# Patient Record
Sex: Male | Born: 1950 | Race: White | Hispanic: No | Marital: Married | Smoking: Never smoker
Health system: Southern US, Community
[De-identification: ages and names within clinical notes are randomized; demographics above are authoritative.]

## PROBLEM LIST (undated history)

## (undated) DIAGNOSIS — N400 Enlarged prostate without lower urinary tract symptoms: Secondary | ICD-10-CM

## (undated) DIAGNOSIS — J189 Pneumonia, unspecified organism: Secondary | ICD-10-CM

## (undated) DIAGNOSIS — E785 Hyperlipidemia, unspecified: Secondary | ICD-10-CM

## (undated) DIAGNOSIS — M1990 Unspecified osteoarthritis, unspecified site: Secondary | ICD-10-CM

## (undated) DIAGNOSIS — K219 Gastro-esophageal reflux disease without esophagitis: Secondary | ICD-10-CM

## (undated) DIAGNOSIS — I10 Essential (primary) hypertension: Secondary | ICD-10-CM

## (undated) DIAGNOSIS — H269 Unspecified cataract: Secondary | ICD-10-CM

## (undated) DIAGNOSIS — Z8489 Family history of other specified conditions: Secondary | ICD-10-CM

## (undated) DIAGNOSIS — H35039 Hypertensive retinopathy, unspecified eye: Secondary | ICD-10-CM

## (undated) DIAGNOSIS — A419 Sepsis, unspecified organism: Secondary | ICD-10-CM

## (undated) DIAGNOSIS — E7800 Pure hypercholesterolemia, unspecified: Secondary | ICD-10-CM

## (undated) HISTORY — DX: Hypertensive retinopathy, unspecified eye: H35.039

## (undated) HISTORY — PX: TONSILLECTOMY: SUR1361

## (undated) HISTORY — DX: Hyperlipidemia, unspecified: E78.5

## (undated) HISTORY — DX: Pure hypercholesterolemia, unspecified: E78.00

## (undated) HISTORY — DX: Essential (primary) hypertension: I10

## (undated) HISTORY — DX: Benign prostatic hyperplasia without lower urinary tract symptoms: N40.0

## (undated) HISTORY — DX: Gastro-esophageal reflux disease without esophagitis: K21.9

## (undated) HISTORY — PX: EYE SURGERY: SHX253

## (undated) HISTORY — DX: Unspecified osteoarthritis, unspecified site: M19.90

## (undated) HISTORY — DX: Unspecified cataract: H26.9

---

## 2017-08-22 HISTORY — PX: OTHER SURGICAL HISTORY: SHX169

## 2019-08-06 ENCOUNTER — Other Ambulatory Visit: Payer: Self-pay

## 2019-08-07 ENCOUNTER — Ambulatory Visit (INDEPENDENT_AMBULATORY_CARE_PROVIDER_SITE_OTHER): Payer: Medicare Other | Admitting: Family Medicine

## 2019-08-07 ENCOUNTER — Encounter: Payer: Self-pay | Admitting: Family Medicine

## 2019-08-07 VITALS — BP 196/110 | HR 88 | Temp 99.4°F | Ht 72.0 in | Wt 195.0 lb

## 2019-08-07 DIAGNOSIS — N402 Nodular prostate without lower urinary tract symptoms: Secondary | ICD-10-CM | POA: Insufficient documentation

## 2019-08-07 DIAGNOSIS — L570 Actinic keratosis: Secondary | ICD-10-CM | POA: Diagnosis not present

## 2019-08-07 DIAGNOSIS — I10 Essential (primary) hypertension: Secondary | ICD-10-CM | POA: Diagnosis not present

## 2019-08-07 DIAGNOSIS — R350 Frequency of micturition: Secondary | ICD-10-CM

## 2019-08-07 DIAGNOSIS — Z7689 Persons encountering health services in other specified circumstances: Secondary | ICD-10-CM

## 2019-08-07 DIAGNOSIS — N401 Enlarged prostate with lower urinary tract symptoms: Secondary | ICD-10-CM | POA: Insufficient documentation

## 2019-08-07 DIAGNOSIS — Z8669 Personal history of other diseases of the nervous system and sense organs: Secondary | ICD-10-CM

## 2019-08-07 MED ORDER — METOPROLOL TARTRATE 50 MG PO TABS: 50.0000 mg | ORAL_TABLET | Freq: Two times a day (BID) | ORAL | 3 refills | Status: DC

## 2019-08-07 NOTE — Patient Instructions (Signed)
Start checking your blood pressure at home.    How to Take Your Blood Pressure You can take your blood pressure at home with a machine. You may need to check your blood pressure at home:  To check if you have high blood pressure (hypertension).  To check your blood pressure over time.  To make sure your blood pressure medicine is working. Supplies needed: You will need a blood pressure machine, or monitor. You can buy one at a drugstore or online. When choosing one:  Choose one with an arm cuff.  Choose one that wraps around your upper arm. Only one finger should fit between your arm and the cuff.  Do not choose one that measures your blood pressure from your wrist or finger. Your doctor can suggest a monitor. How to prepare Avoid these things for 30 minutes before checking your blood pressure:  Drinking caffeine.  Drinking alcohol.  Eating.  Smoking.  Exercising. Five minutes before checking your blood pressure:  Pee.  Sit in a dining chair. Avoid sitting in a soft couch or armchair.  Be quiet. Do not talk. How to take your blood pressure Follow the instructions that came with your machine. If you have a digital blood pressure monitor, these may be the instructions: 1. Sit up straight. 2. Place your feet on the floor. Do not cross your ankles or legs. 3. Rest your left arm at the level of your heart. You may rest it on a table, desk, or chair. 4. Pull up your shirt sleeve. 5. Wrap the blood pressure cuff around the upper part of your left arm. The cuff should be 1 inch (2.5 cm) above your elbow. It is best to wrap the cuff around bare skin. 6. Fit the cuff snugly around your arm. You should be able to place only one finger between the cuff and your arm. 7. Put the cord inside the groove of your elbow. 8. Press the power button. 9. Sit quietly while the cuff fills with air and loses air. 10. Write down the numbers on the screen. 11. Wait 2-3 minutes and then repeat  steps 1-10. What do the numbers mean? Two numbers make up your blood pressure. The first number is called systolic pressure. The second is called diastolic pressure. An example of a blood pressure reading is "120 over 80" (or 120/80). If you are an adult and do not have a medical condition, use this guide to find out if your blood pressure is normal: Normal  First number: below 120.  Second number: below 80. Elevated  First number: 120-129.  Second number: below 80. Hypertension stage 1  First number: 130-139.  Second number: 80-89. Hypertension stage 2  First number: 140 or above.  Second number: 11 or above. Your blood pressure is above normal even if only the top or bottom number is above normal. Follow these instructions at home:  Check your blood pressure as often as your doctor tells you to.  Take your monitor to your next doctor's appointment. Your doctor will: ? Make sure you are using it correctly. ? Make sure it is working right.  Make sure you understand what your blood pressure numbers should be.  Tell your doctor if your medicines are causing side effects. Contact a doctor if:  Your blood pressure keeps being high. Get help right away if:  Your first blood pressure number is higher than 180.  Your second blood pressure number is higher than 120. This information is not  intended to replace advice given to you by your health care provider. Make sure you discuss any questions you have with your health care provider. Document Released: 07/21/2008 Document Revised: 07/21/2017 Document Reviewed: 01/15/2016 Elsevier Patient Education  2020 [employer].

## 2019-08-07 NOTE — Progress Notes (Signed)
 Subjective: CC:establish care, BPH with LUTS and nodular prostate; history of left retinal tear, hypertension and hyperlipidemia HPI: Ralph Stevens is a 68 y.o. male presenting to clinic today for:  1.  Hypertension with hyperlipidemia Patient reports compliance with metoprolol 50 mg twice daily, lisinopril 40 mg daily, Lipitor 10 mg daily.  He notes that blood pressures typically range 120s over 80s at home.  He is unsure as to why his blood pressure is elevated today.  He denies any chest pain, shortness of breath, dizziness, headache or lower extremity edema.  2.  BPH with urinary frequency and nodular prostate Patient was being followed by a urologist in [place] who is keeping a close eye on his prostate.  He had previous MRI of the prostate which did not show evidence of malignancy.  He is treated with Flomax for urinary frequency and reports good results with this.  He had labs done earlier this year which were stable.  He would like to establish with somebody locally.  3.  History of retinal tear Patient reports history of retinal tear on the left that is status post surgical repair.  Vision is stable but he would like to go ahead and establish with an ophthalmologist to follow-up on this.  4.  Facial lesions Patient reports a couple of crusty lesions along the right and left cheeks that he would like to have looked at today.  He would like to ultimately establish with a dermatologist given exposure to sunlight.  He has had previous cryotherapy but she would like to have repeated today if possible.  Does not report any pigmented nevi of concern.  Past Medical History:  Diagnosis Date  . Arthritis   . GERD (gastroesophageal reflux disease)   . Hyperlipidemia   . Hypertension    History reviewed. No pertinent surgical history. Social History   Socioeconomic History  . Marital status: Married    Spouse name: Not on file  . Number of children: 3  . Years of education:  Not on file  . Highest education level: Not on file  Occupational History  . Not on file  Tobacco Use  . Smoking status: Never Smoker  . Smokeless tobacco: Never Used  Substance and Sexual Activity  . Alcohol use: Never  . Drug use: Never  . Sexual activity: Not on file  Other Topics Concern  . 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  He currently is unemployed but is actively looking for work as he enjoys being busy.   He recently relocated from [place] and resides locally with his wife.  He has a son that lives in [place] but his other 2 daughters are residing in [place].   Social Determinants of Health   Financial Resource Strain:   . Difficulty of Paying Living Expenses: Not on file  Food Insecurity:   . Worried About Running Out of Food in the Last Year: Not on file  . Ran Out of Food in the Last Year: Not on file  Transportation Needs:   . Lack of Transportation (Medical): Not on file  . Lack of Transportation (Non-Medical): Not on file  Physical Activity:   . Days of Exercise per Week: Not on file  . Minutes of Exercise per Session: Not on file  Stress:   . Feeling of Stress : Not on file  Social Connections:   .   Frequency of Communication with Friends and Family: Not on file  . Frequency of Social Gatherings with Friends and Family: Not on file  . Attends Religious Services: Not on file  . Active Member of Clubs or Organizations: Not on file  . Attends Club or Organization Meetings: Not on file  . Marital Status: Not on file  Intimate Partner Violence:   . Fear of Current or Ex-Partner: Not on file  . Emotionally Abused: Not on file  . Physically Abused: Not on file  . Sexually Abused: Not on file   Current Meds  Medication Sig  . aspirin 81 MG chewable tablet Chew by mouth daily.  . esomeprazole (NEXIUM) 20 MG capsule Take 20 mg by mouth daily at 12 noon.  . fluticasone  (FLONASE) 50 MCG/ACT nasal spray Place into both nostrils daily.  . metoprolol tartrate (LOPRESSOR) 50 MG tablet Take 1 tablet (50 mg total) by mouth 2 (two) times daily.  . tamsulosin (FLOMAX) 0.4 MG CAPS capsule Take 0.4 mg by mouth daily after supper.  . [DISCONTINUED] metoprolol tartrate (LOPRESSOR) 50 MG tablet Take 50 mg by mouth 2 (two) times daily.   Family History  Problem Relation Age of Onset  . Cancer Mother   . Colon cancer Mother   . COPD Father   . Hypothyroidism Sister   . Thyroid disease Sister   . Hypertension Sister   . Multiple sclerosis Daughter   . Thyroid disease Sister    No Known Allergies   Health Maintenance: Up-to-date ROS: Per HPI  Objective: Office vital signs reviewed. BP (!) 196/110   Pulse 88   Temp 99.4 F (37.4 C) (Temporal)   Ht 6' (1.829 m)   Wt 195 lb (88.5 kg)   SpO2 97%   BMI 26.45 kg/m   Physical Examination:  General: Awake, alert, well nourished, well appearing. No acute distress HEENT: Sclera white, moist mucous membranes Cardio: regular rate and rhythm, S1S2 heard, no murmurs appreciated Pulm: clear to auscultation bilaterally, no wheezes, rhonchi or rales; normal work of breathing on room air Extremities: warm, well perfused, No edema, cyanosis or clubbing; +2 pulses bilaterally MSK: normal gait and station Skin: dry; intact; he has 1 lesion along the right middle cheek that is consistent with a small actinic keratosis.  No appreciable pigmentation.  He has a similar lesion along the left jaw about the same size.  Again no pigmentation or other concerning features. Neuro: No focal neurologic deficits.  Alert and oriented x3 Psych: Mood stable, speech normal, affect appropriate, pleasant and interactive  Cryotherapy Procedure:  Risks and benefits of procedure were reviewed with the patient.  Written consent obtained and scanned into the chart.  Lesion of concern was identified and located on right cheek.  Liquid nitrogen was  applied to area of concern and extending out 1 millimeters beyond the border of the lesion.  Treated area was allowed to come back to room temperature before treating it a second time.  Patient tolerated procedure well and there were no immediate complications.  Home care instructions were reviewed with the patient and a handout was provided.  Cryotherapy Procedure:  Risks and benefits of procedure were reviewed with the patient.  Written consent obtained and scanned into the chart.  Lesion of concern was identified and located on left cheek/jaw.  Liquid nitrogen was applied to area of concern and extending out 1 millimeters beyond the border of the lesion.  Treated area was allowed to come back to room temperature   before treating it a second time.  Patient tolerated procedure well and there were no immediate complications.  Home care instructions were reviewed with the patient and a handout was provided.   Assessment/ Plan: 68 y.o. male   1. Accelerated hypertension Uncertain etiology.  He reports normal blood pressures at home.  Possibly anxiety mediated given new appointment.  I have asked him to monitor his blood pressures very closely and contact me if blood pressures are not going back to goal of less than 140/90.  May need to consider adding a calcium channel blocker.  I believe that hydrochlorothiazide was discontinued previously secondary to increased urinary frequency which he already struggles with due to BPH.  He understands red flag signs and symptoms warranting further evaluation emergency department.  He will follow-up in 1 week for blood pressure check with RN - [id number]  2. Establishing care with new doctor, encounter for  3. Benign prostatic hyperplasia with urinary frequency Asymptomatic.  Referral placed to urology.  Will fax notes from [place] - Ambulatory referral to Urology  4. Nodular prostate - Ambulatory referral to Urology  5. Actinic keratoses Treated with  cryotherapy during today's visit.  No immediate complications.  Patient tolerated procedure well.  Home care instructions were reviewed.  He will follow-up as needed - Ambulatory referral to Dermatology  6. History of retinal tear Referred to ophthalmology - Ambulatory referral to Ophthalmology   Ralph Stevens, [place] [phone]

## 2019-08-08 LAB — CMP14+EGFR
ALT: 12 IU/L (ref 0–44)
AST: 14 IU/L (ref 0–40)
Albumin/Globulin Ratio: 1.6 (ref 1.2–2.2)
Albumin: 4.4 g/dL (ref 3.8–4.8)
Alkaline Phosphatase: 73 IU/L (ref 39–117)
BUN/Creatinine Ratio: 11 (ref 10–24)
BUN: 10 mg/dL (ref 8–27)
Bilirubin Total: 0.6 mg/dL (ref 0.0–1.2)
CO2: 21 mmol/L (ref 20–29)
Calcium: 8.9 mg/dL (ref 8.6–10.2)
Chloride: 106 mmol/L (ref 96–106)
Creatinine, Ser: 0.88 mg/dL (ref 0.76–1.27)
GFR calc Af Amer: 103 mL/min/{1.73_m2} (ref >59)
GFR calc non Af Amer: 89 mL/min/{1.73_m2} (ref >59)
Globulin, Total: 2.7 g/dL (ref 1.5–4.5)
Glucose: 105 mg/dL — ABNORMAL HIGH (ref 65–99)
Potassium: 3.7 mmol/L (ref 3.5–5.2)
Sodium: 143 mmol/L (ref 134–144)
Total Protein: 7.1 g/dL (ref 6.0–8.5)

## 2019-08-12 ENCOUNTER — Other Ambulatory Visit: Payer: Self-pay

## 2019-08-12 ENCOUNTER — Ambulatory Visit: Payer: Medicare Other

## 2019-08-12 VITALS — BP 199/97 | HR 84

## 2019-08-12 DIAGNOSIS — I10 Essential (primary) hypertension: Secondary | ICD-10-CM

## 2019-08-12 NOTE — Progress Notes (Signed)
Patient here today for blood pressure check.  First blood pressure reading today was 208/100, pulse 87.  Patient sat for a few minutes and blood pressure was retaken, reading was 199/97, pulse 84.  Patient was seen by Dr. Lajuana Ripple on 08/07/19 and blood pressure was elevated at that time.  Notes suggest possible addition of calcium channel blocker.

## 2019-08-13 ENCOUNTER — Other Ambulatory Visit: Payer: Self-pay | Admitting: Physician Assistant

## 2019-08-13 ENCOUNTER — Telehealth: Payer: Self-pay | Admitting: Family Medicine

## 2019-08-13 MED ORDER — AMLODIPINE BESYLATE 5 MG PO TABS: 5.0000 mg | ORAL_TABLET | Freq: Every day | ORAL | 0 refills | Status: DC

## 2019-08-13 NOTE — Telephone Encounter (Signed)
Spoke to pt and advised we sent over Amlodipine 5mg  and that he should continue the metoprolol as he has been taking it and just add the amlodipine to that as well as monitor his BP readings. Pt voiced understanding.

## 2019-08-13 NOTE — Progress Notes (Signed)
Patient is aware and will pick up med this morning.  He will call with report of blood pressure.  Barnett Applebaum is aware.

## 2019-08-13 NOTE — Progress Notes (Signed)
Patient's readings at home are ranging from 172/87, 186/98, 162/96, 166/86, 160/93.  I made an appointment for a follow up with Dr. Lajuana Ripple on 09/02/19.  Please advise on any medication changes.

## 2019-08-13 NOTE — Telephone Encounter (Signed)
Patient called stating that a prescription was just sent over to his pharmacy for his BP but says he has already been taking Metoprolol 50 mg 2x per day. Wants to know what is going on.. Requested call back from nurse.

## 2019-08-19 ENCOUNTER — Telehealth: Payer: Self-pay | Admitting: Family Medicine

## 2019-08-19 NOTE — Telephone Encounter (Signed)
Spoke with pt and advised of provider feedback and pt voiced understanding.

## 2019-08-19 NOTE — Telephone Encounter (Signed)
Recent BPs in controlled range.  Continue current regimen, including newly added amlodipine.  Keep f/u BP appt.

## 2019-08-30 ENCOUNTER — Other Ambulatory Visit: Payer: Self-pay

## 2019-09-02 ENCOUNTER — Ambulatory Visit (INDEPENDENT_AMBULATORY_CARE_PROVIDER_SITE_OTHER): Payer: Medicare Other | Admitting: Family Medicine

## 2019-09-02 ENCOUNTER — Encounter: Payer: Self-pay | Admitting: Family Medicine

## 2019-09-02 ENCOUNTER — Other Ambulatory Visit: Payer: Self-pay

## 2019-09-02 VITALS — BP 127/81 | HR 73 | Temp 98.6°F | Ht 72.0 in | Wt 196.0 lb

## 2019-09-02 DIAGNOSIS — I10 Essential (primary) hypertension: Secondary | ICD-10-CM | POA: Diagnosis not present

## 2019-09-02 MED ORDER — HYDROCHLOROTHIAZIDE 12.5 MG PO CAPS: 12.5000 mg | ORAL_CAPSULE | Freq: Every day | ORAL | 3 refills | Status: DC

## 2019-09-02 MED ORDER — AMLODIPINE BESYLATE 5 MG PO TABS: 5.0000 mg | ORAL_TABLET | Freq: Every day | ORAL | 3 refills | Status: DC

## 2019-09-02 NOTE — Progress Notes (Signed)
Subjective: CC: f/u HTN PCP: [name], DO [id number] Ralph Stevens is a 69 y.o. male presenting to clinic today for:  1. HTN Patient noted to have uncontrolled blood pressures despite compliance with at home blood pressure medications.  At his 1 week follow-up with the nurse his blood pressure continue to be well into the [id number] systolics.  Norvasc 5 mg was added to his regimen of Zestril 40 mg QD and Lopressor 50 mg twice daily.  His blood pressures at home had subsequently come back into normal range.  He is here for interval checkup.  He does report that he resumed his hydrochlorothiazide 12.5 mg after her last visit but this did not seem to impact his blood pressures much.  He brings a blood pressure log today with the highest systolic in the 0000000 but average systolics in the 0000000 to [id number].  Diastolics are controlled.  He denies any lower extremity edema with the Norvasc.  No chest pain, shortness of breath or dizziness.  Overall he feels well.   ROS: Per HPI  No Known Allergies Past Medical History:  Diagnosis Date  . Arthritis   . GERD (gastroesophageal reflux disease)   . Hyperlipidemia   . Hypertension     Current Outpatient Medications:  .  amLODipine (NORVASC) 5 MG tablet, Take 1 tablet (5 mg total) by mouth daily., Disp: 30 tablet, Rfl: 0 .  aspirin 81 MG chewable tablet, Chew by mouth daily., Disp: , Rfl:  .  atorvastatin (LIPITOR) 10 MG tablet, Take 10 mg by mouth daily., Disp: , Rfl:  .  esomeprazole (NEXIUM) 20 MG capsule, Take 20 mg by mouth daily at 12 noon., Disp: , Rfl:  .  fluticasone (FLONASE) 50 MCG/ACT nasal spray, Place into both nostrils daily., Disp: , Rfl:  .  lisinopril (ZESTRIL) 40 MG tablet, Take 40 mg by mouth daily., Disp: , Rfl:  .  metoprolol tartrate (LOPRESSOR) 50 MG tablet, Take 1 tablet (50 mg total) by mouth 2 (two) times daily., Disp: 180 tablet, Rfl: 3 .  tamsulosin (FLOMAX) 0.4 MG CAPS capsule, Take 0.4 mg by mouth daily after supper., Disp: ,  Rfl:  Social History   Socioeconomic History  . Marital status: Married    Spouse name: Not on file  . Number of children: 3  . Years of education: Not on file  . Highest education level: Not on file  Occupational History  . Not on file  Tobacco Use  . Smoking status: Never Smoker  . Smokeless tobacco: Never Used  Substance and Sexual Activity  . Alcohol use: Never  . Drug use: Never  . Sexual activity: Not on file  Other Topics Concern  . 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  He currently is unemployed but is actively looking for work as he enjoys being busy.   He recently relocated from [place] and resides locally with his wife.  He has a son that lives in [place] but his other 2 daughters are residing in [place].   Social Determinants of Health   Financial Resource Strain:   . Difficulty of Paying Living Expenses: Not on file  Food Insecurity:   . Worried About Charity fundraiser in the Last Year: Not on file  . Ran Out of Food in the Last Year: Not on file  Transportation Needs:   . Lack of Transportation (Medical):  Not on file  . Lack of Transportation (Non-Medical): Not on file  Physical Activity:   . Days of Exercise per Week: Not on file  . Minutes of Exercise per Session: Not on file  Stress:   . Feeling of Stress : Not on file  Social Connections:   . Frequency of Communication with Friends and Family: Not on file  . Frequency of Social Gatherings with Friends and Family: Not on file  . Attends Religious Services: Not on file  . Active Member of Clubs or Organizations: Not on file  . Attends Archivist Meetings: Not on file  . Marital Status: Not on file  Intimate Partner Violence:   . Fear of Current or Ex-Partner: Not on file  . Emotionally Abused: Not on file  . Physically Abused: Not on file  . Sexually Abused: Not on file   Family History  Problem  Relation Age of Onset  . Cancer Mother   . Colon cancer Mother   . COPD Father   . Hypothyroidism Sister   . Thyroid disease Sister   . Hypertension Sister   . Multiple sclerosis Daughter   . Thyroid disease Sister     Objective: Office vital signs reviewed. BP 127/81   Pulse 73   Temp 98.6 F (37 C) (Temporal)   Ht 6' (1.829 m)   Wt 196 lb (88.9 kg)   SpO2 99%   BMI 26.58 kg/m   Physical Examination:  General: Awake, alert, well nourished, No acute distress HEENT: Normal, MMM, sclera white Cardio: regular rate and rhythm, S1S2 heard, no murmurs appreciated Pulm: clear to auscultation bilaterally, no wheezes, rhonchi or rales; normal work of breathing on room air Extremities: warm, well perfused, No edema, cyanosis or clubbing; +1 pulses bilaterally  Assessment/ Plan: 69 y.o. male   1. Essential hypertension Now controlled.  Continue quadruple therapy antihypertensives.  We discussed could consider trial off of the diuretic if becomes bothersome since it did not have a significant impact in his blood pressure.  Though it may be mitigating issues with pedal edema with the calcium channel blocker.  He will follow-up in 6 months, sooner if needed. - hydrochlorothiazide (MICROZIDE) 12.5 MG capsule; Take 1 capsule (12.5 mg total) by mouth daily.  Dispense: 90 capsule; Refill: 3 - amLODipine (NORVASC) 5 MG tablet; Take 1 tablet (5 mg total) by mouth daily.  Dispense: 90 tablet; Refill: 3   No orders of the defined types were placed in this encounter.  No orders of the defined types were placed in this encounter.    [name], DO [place] [phone]

## 2019-09-03 DIAGNOSIS — B9689 Other specified bacterial agents as the cause of diseases classified elsewhere: Secondary | ICD-10-CM | POA: Diagnosis not present

## 2019-09-03 DIAGNOSIS — Z1283 Encounter for screening for malignant neoplasm of skin: Secondary | ICD-10-CM | POA: Diagnosis not present

## 2019-09-03 DIAGNOSIS — D225 Melanocytic nevi of trunk: Secondary | ICD-10-CM | POA: Diagnosis not present

## 2019-09-03 DIAGNOSIS — L570 Actinic keratosis: Secondary | ICD-10-CM | POA: Diagnosis not present

## 2019-09-03 DIAGNOSIS — L02821 Furuncle of head [any part, except face]: Secondary | ICD-10-CM | POA: Diagnosis not present

## 2019-09-17 ENCOUNTER — Ambulatory Visit: Payer: Medicare Other

## 2019-09-17 NOTE — Progress Notes (Signed)
[facility] Note  09/30/2019     CHIEF COMPLAINT Patient presents for Retina Evaluation   HISTORY OF PRESENT ILLNESS: Ralph Stevens is a 69 y.o. male who presents to the clinic today for:   HPI    Retina Evaluation    In both eyes.  This started 1 month ago.  Associated Symptoms Floaters.  Negative for Flashes, Pain, Trauma, Fever, Weight Loss, Scalp Tenderness, Redness, Distortion, Photophobia, Jaw Claudication, Fatigue, Shoulder/Hip pain, Glare and Blind Spot.  Context:  distance vision, mid-range vision and near vision.  Treatments tried include surgery.  Response to treatment was no improvement.  I, the attending physician,  performed the HPI with the patient and updated documentation appropriately.          Comments    Referral of Dr. Lajuana Ripple for retina eval. Patient states he had retina repair os 3 yrs ago by Dr. Shana Chute in [place]. [place]. Pt denies flashes and ocular pain. Pt has hx of floaters.       Last edited by Bernarda Caffey, MD on 09/30/2019  1:41 PM. (History)    pt moved here from [place]. [place], [place] in September, pt had a retinal tear in his left eye that he had lasered by a dr in [place]. [place] [facility]), pt states he followed with that dr until early last year (Dr. Trudie Reed), pt states he feels like his vision is better since his last eye exam  Referring physician: [name], DO [place],  [place] [address]  HISTORICAL INFORMATION:   Selected notes from the MEDICAL RECORD NUMBER Referred by PCP (Dr. Lajuana Ripple) for history of retinal tear OS   CURRENT MEDICATIONS: No current outpatient medications on file. (Ophthalmic Drugs)   No current facility-administered medications for this visit. (Ophthalmic Drugs)   Current Outpatient Medications (Other)  Medication Sig  . alfuzosin (UROXATRAL) 10 MG 24 hr tablet Take 10 mg by mouth daily with breakfast.  . alfuzosin (UROXATRAL) 10 MG 24 hr tablet Take 1 tablet  (10 mg total) by mouth daily with breakfast.  . amLODipine (NORVASC) 5 MG tablet Take 1 tablet (5 mg total) by mouth daily.  Marland Kitchen aspirin 81 MG chewable tablet Chew by mouth daily.  Marland Kitchen atorvastatin (LIPITOR) 10 MG tablet Take 10 mg by mouth daily.  Marland Kitchen esomeprazole (NEXIUM) 20 MG capsule Take 20 mg by mouth daily at 12 noon.  . fluticasone (FLONASE) 50 MCG/ACT nasal spray Place into both nostrils daily.  Marland Kitchen lisinopril (ZESTRIL) 40 MG tablet Take 40 mg by mouth daily.  . metoprolol tartrate (LOPRESSOR) 50 MG tablet Take 1 tablet (50 mg total) by mouth 2 (two) times daily.  . hydrochlorothiazide (MICROZIDE) 12.5 MG capsule Take 12.5 mg by mouth daily.  . tamsulosin (FLOMAX) 0.4 MG CAPS capsule Take 0.4 mg by mouth daily after supper.   No current facility-administered medications for this visit. (Other)      REVIEW OF SYSTEMS: ROS    Positive for: Cardiovascular, Eyes   Negative for: Constitutional, Gastrointestinal, Neurological, Skin, Genitourinary, Musculoskeletal, HENT, Endocrine, Respiratory, Psychiatric, Allergic/Imm, Heme/Lymph   Last edited by Zenovia Jordan, LPN on [id number]  579FGE PM. (History)       ALLERGIES No Known Allergies  PAST MEDICAL HISTORY Past Medical History:  Diagnosis Date  . Acid reflux   . Arthritis   . Arthritis   . Enlarged prostate    PSA goes up and down   . GERD (gastroesophageal  reflux disease)   . High cholesterol   . Hyperlipidemia   . Hypertension    Past Surgical History:  Procedure Laterality Date  . EYE SURGERY    . TONSILLECTOMY     age 20     FAMILY HISTORY Family History  Problem Relation Age of Onset  . Cancer Mother   . Colon cancer Mother   . Dementia Mother   . Alzheimer's disease Mother   . Emphysema Father   . Hypothyroidism Sister   . Thyroid disease Sister   . Hypertension Sister   . Multiple sclerosis Daughter   . Heart defect Daughter   . Cancer Maternal Grandmother        colon  . Heart disease Maternal  Grandfather   . Heart disease Paternal Grandmother   . Cancer Paternal Grandfather        unknown ( mouth/throat)    SOCIAL HISTORY Social History   Tobacco Use  . Smoking status: Never Smoker  . Smokeless tobacco: Never Used  Substance Use Topics  . Alcohol use: Never  . Drug use: Never         OPHTHALMIC EXAM:  Base Eye Exam    Visual Acuity (Snellen - Linear)      Right Left   Dist cc 20/30 20/20   Dist ph cc 20/25 +1    Correction: Glasses       Tonometry (Tonopen, 1:21 PM)      Right Left   Pressure 12 14       Pupils      Dark Light Shape React APD   Right 3 2 Round Brisk None   Left 3 2 Round Brisk None       Visual Fields (Counting fingers)      Left Right    Full Full       Extraocular Movement      Right Left    Full, Ortho Full, Ortho       Neuro/Psych    Oriented x3: Yes       Dilation    Both eyes: 1.0% Mydriacyl, 2.5% Phenylephrine @ 1:21 PM        Slit Lamp and Fundus Exam    Slit Lamp Exam      Right Left   Lids/Lashes Dermatochalasis - upper lid, mild Meibomian gland dysfunction Dermatochalasis - upper lid, mild Meibomian gland dysfunction   Conjunctiva/Sclera White and quiet White and quiet   Cornea Trace Punctate epithelial erosions, mild Arcus Trace Punctate epithelial erosions, mild Arcus   Anterior Chamber Deep,clear, narrow temporal angle Deep and quiet   Iris Round and moderately dilated to 5.58mm Round and moderately dilated to 5.19mm   Lens 2-3+ Nuclear sclerosis, 2-3+ Cortical cataract 2-3+ Nuclear sclerosis, 2-3+ Cortical cataract   Vitreous Vitreous syneresis Vitreous syneresis       Fundus Exam      Right Left   Disc Pink and Sharp Pink and Sharp   C/D Ratio 0.3 0.2   Macula Flat, Good foveal reflex, Retinal pigment epithelial mottling, No heme or edema Flat, blunted foveal reflex, ERM with striae, No heme    Vessels Mild Vascular attenuation, mild Tortuousity Mild Vascular attenuation, mild Tortuousity    Periphery Attached, No heme  Attached, operculated tear at 1200 with good laser surrounding, No heme           IMAGING AND PROCEDURES  Imaging and Procedures for @TODAY @  OCT, Retina - OU - Both Eyes  Right Eye Quality was good. Central Foveal Thickness: 295. Progression has no prior data. Findings include normal foveal contour, no IRF, no SRF.   Left Eye Quality was good. Central Foveal Thickness: 403. Progression has no prior data. Findings include abnormal foveal contour, epiretinal membrane, no IRF, no SRF, macular pucker.   Notes *Images captured and stored on drive  Diagnosis / Impression:  OD: NFP, no IRF/SRF OS: +ERM w/ pucker; abnormal foveal profile; no IRF/SRF  Clinical management:  See below  Abbreviations: NFP - Normal foveal profile. CME - cystoid macular edema. PED - pigment epithelial detachment. IRF - intraretinal fluid. SRF - subretinal fluid. EZ - ellipsoid zone. ERM - epiretinal membrane. ORA - outer retinal atrophy. ORT - outer retinal tubulation. SRHM - subretinal hyper-reflective material                 ASSESSMENT/PLAN:    ICD-10-CM   1. Epiretinal membrane (ERM) of left eye  H35.372   2. Retinal edema  H35.81 OCT, Retina - OU - Both Eyes  3. History of repair of retinal tear by laser photocoagulation  Z98.890   4. Essential hypertension  I10   5. Hypertensive retinopathy of both eyes  H35.033   6. Combined forms of age-related cataract of both eyes  H25.813     1,2. Epiretinal membrane, OS  - The natural history, anatomy, potential for loss of vision, and treatment options including vitrectomy techniques and the complications of endophthalmitis, retinal detachment, vitreous hemorrhage, cataract progression and permanent vision loss discussed with the patient.  - likely related to history of RT s/p laser retinopexy (see below)  - mild ERM w/ early pucker  - asymptomatic, no metamorphopsia  - no indication for surgery at this  time  - monitor for now  - f/u 3-4 mos, DFE, OCT  3. History of retinal tear OS  - operculated tear located at 1200  - s/p laser retinopexy (Dr. Shana Chute, [facility], [place], [place]) -- good laser surrounding  - stable  - no other RT/RD  4,5. Hypertensive retinopathy OU  - discussed importance of tight BP control  - monitor  6. Mixed form age related cataracts OU  - The symptoms of cataract, surgical options, and treatments and risks were discussed with patient.  - discussed diagnosis and progression  - not yet visually significant  - monitor for now    Ophthalmic Meds Ordered this visit:  No orders of the defined types were placed in this encounter.      Return for 3-4 mos - f/u ERM OS.  There are no Patient Instructions on file for this visit.   Explained the diagnoses, plan, and follow up with the patient and they expressed understanding.  Patient expressed understanding of the importance of proper follow up care.   This document serves as a record of services personally performed by Gardiner Sleeper, MD, PhD. It was created on their behalf by Leeann Must, [place], a certified ophthalmic assistant. The creation of this record is the provider's dictation and/or activities during the visit.    Electronically signed by: Leeann Must, COA @TODAY @ 11:42 PM   This document serves as a record of services personally performed by Gardiner Sleeper, MD, PhD. It was created on their behalf by Ernest Mallick, OA, an ophthalmic assistant. The creation of this record is the provider's dictation and/or activities during the visit.    Electronically signed by: Ernest Mallick, OA 02.08.2021 11:42 PM    Aaron Edelman  Antony Haste, M.D., Ph.D. Diseases & Surgery of the Retina and Vitreous [facility]   I have reviewed the above documentation for accuracy and completeness, and I agree with the above. Gardiner Sleeper, M.D., Ph.D. 09/30/19 11:42  PM     Abbreviations: M myopia (nearsighted); A astigmatism; H hyperopia (farsighted); P presbyopia; Mrx spectacle prescription;  CTL contact lenses; OD right eye; OS left eye; OU both eyes  XT exotropia; ET esotropia; PEK punctate epithelial keratitis; PEE punctate epithelial erosions; DES dry eye syndrome; MGD meibomian gland dysfunction; ATs artificial tears; PFAT's preservative free artificial tears; [place] nuclear sclerotic cataract; PSC posterior subcapsular cataract; ERM epi-retinal membrane; PVD posterior vitreous detachment; RD retinal detachment; DM diabetes mellitus; DR diabetic retinopathy; NPDR non-proliferative diabetic retinopathy; PDR proliferative diabetic retinopathy; CSME clinically significant macular edema; DME diabetic macular edema; dbh dot blot hemorrhages; CWS cotton wool spot; POAG primary open angle glaucoma; C/D cup-to-disc ratio; HVF humphrey visual field; GVF goldmann visual field; OCT optical coherence tomography; IOP intraocular pressure; BRVO Branch retinal vein occlusion; CRVO central retinal vein occlusion; CRAO central retinal artery occlusion; BRAO branch retinal artery occlusion; RT retinal tear; SB scleral buckle; PPV pars plana vitrectomy; VH Vitreous hemorrhage; PRP panretinal laser photocoagulation; IVK intravitreal kenalog; VMT vitreomacular traction; MH Macular hole;  NVD neovascularization of the disc; NVE neovascularization elsewhere; AREDS age related eye disease study; ARMD age related macular degeneration; POAG primary open angle glaucoma; EBMD epithelial/anterior basement membrane dystrophy; ACIOL anterior chamber intraocular lens; IOL intraocular lens; PCIOL posterior chamber intraocular lens; Phaco/IOL phacoemulsification with intraocular lens placement; [place] photorefractive keratectomy; LASIK laser assisted in situ keratomileusis; HTN hypertension; DM diabetes mellitus; COPD chronic obstructive pulmonary disease

## 2019-09-20 ENCOUNTER — Ambulatory Visit (INDEPENDENT_AMBULATORY_CARE_PROVIDER_SITE_OTHER): Payer: Medicare Other | Admitting: *Deleted

## 2019-09-20 VITALS — BP 125/75 | Ht 72.0 in | Wt 196.0 lb

## 2019-09-20 DIAGNOSIS — Z0000 Encounter for general adult medical examination without abnormal findings: Secondary | ICD-10-CM | POA: Diagnosis not present

## 2019-09-20 NOTE — Progress Notes (Signed)
MEDICARE ANNUAL WELLNESS VISIT  09/20/2019  Telephone Visit Disclaimer This Medicare AWV was conducted by telephone due to national recommendations for restrictions regarding the COVID-19 Pandemic (e.g. social distancing).  I verified, using two identifiers, that I am speaking with Ralph Stevens or their authorized healthcare agent. I discussed the limitations, risks, security, and privacy concerns of performing an evaluation and management service by telephone and the potential availability of an in-person appointment in the future. The patient expressed understanding and agreed to proceed.   Subjective:  Ralph Stevens is a 69 y.o. male patient of Ralph Norlander, DO who had a Medicare Annual Wellness Visit today via telephone. Ralph Stevens is semi- Retired and lives with their spouse. he has 3 children. he reports that he is socially active and does interact with friends/family regularly. he is minimally physically active and enjoys fishing, watching baseball and going to stock-car races.  Patient Care Team: Ralph Norlander, DO as PCP - General (Family Medicine) Irine Seal, MD as Attending Physician (Urology) Franchot Gallo, MD as Consulting Physician (Urology) Bernarda Caffey, MD as Consulting Physician (Ophthalmology) Allyn Kenner, MD (Dermatology)  Advanced Directives 09/20/2019  Does Patient Have a Medical Advance Directive? No  Would patient like information on creating a medical advance directive? No - Patient declined    Hospital Utilization Over the Past 12 Months: # of hospitalizations or ER visits: 0  # of surgeries: 0  Review of Systems    Patient reports that his overall health is unchanged compared to last year.  General ROS: negative  Patient Reported Readings (BP, Pulse, CBG, Weight, etc) BP 125/75 Comment: home reading  Ht 6' (1.829 m)   Wt 196 lb (88.9 kg)   BMI 26.58 kg/m    Pain Assessment       Current Medications & Allergies  (verified) Allergies as of 09/20/2019   No Known Allergies     Medication List       Accurate as of September 20, 2019 11:44 AM. If you have any questions, ask your nurse or doctor.        STOP taking these medications   hydrochlorothiazide 12.5 MG capsule Commonly known as: MICROZIDE     TAKE these medications   amLODipine 5 MG tablet Commonly known as: NORVASC Take 1 tablet (5 mg total) by mouth daily.   aspirin 81 MG chewable tablet Chew by mouth daily.   atorvastatin 10 MG tablet Commonly known as: LIPITOR Take 10 mg by mouth daily.   esomeprazole 20 MG capsule Commonly known as: NEXIUM Take 20 mg by mouth daily at 12 noon.   fluticasone 50 MCG/ACT nasal spray Commonly known as: FLONASE Place into both nostrils daily.   lisinopril 40 MG tablet Commonly known as: ZESTRIL Take 40 mg by mouth daily.   metoprolol tartrate 50 MG tablet Commonly known as: LOPRESSOR Take 1 tablet (50 mg total) by mouth 2 (two) times daily.   tamsulosin 0.4 MG Caps capsule Commonly known as: FLOMAX Take 0.4 mg by mouth daily after supper.       History (reviewed): Past Medical History:  Diagnosis Date  . Arthritis   . Enlarged prostate    PSA goes up and down   . GERD (gastroesophageal reflux disease)   . Hyperlipidemia   . Hypertension    Past Surgical History:  Procedure Laterality Date  . TONSILLECTOMY     age 57    Family History  Problem Relation Age of Onset  . Cancer  Mother   . Colon cancer Mother   . Dementia Mother   . Emphysema Father   . Hypothyroidism Sister   . Thyroid disease Sister   . Hypertension Sister   . Multiple sclerosis Daughter   . Heart defect Daughter   . Cancer Maternal Grandmother        colon  . Heart disease Maternal Grandfather   . Heart disease Paternal Grandmother   . Cancer Paternal Grandfather        unknown ( mouth/throat)   Social History   Socioeconomic History  . Marital status: Married    Spouse name: Ralph Stevens  .  Number of children: 3  . Years of education: Not on file  . Highest education level: Not on file  Occupational History    Comment: semi-retired   Tobacco Use  . Smoking status: Never Smoker  . Smokeless tobacco: Never Used  Substance and Sexual Activity  . Alcohol use: Never  . Drug use: Never  . Sexual activity: Not on file  Other Topics Concern  . 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  He currently is unemployed but is actively looking for work as he enjoys being busy.   He recently relocated from [place] and resides locally with his wife.  He has a son that lives in [place] but his other 2 daughters are residing in [place].   Social Determinants of Health   Financial Resource Strain:   . Difficulty of Paying Living Expenses: Not on file  Food Insecurity:   . Worried About Charity fundraiser in the Last Year: Not on file  . Ran Out of Food in the Last Year: Not on file  Transportation Needs:   . Lack of Transportation (Medical): Not on file  . Lack of Transportation (Non-Medical): Not on file  Physical Activity:   . Days of Exercise per Week: Not on file  . Minutes of Exercise per Session: Not on file  Stress:   . Feeling of Stress : Not on file  Social Connections:   . Frequency of Communication with Friends and Family: Not on file  . Frequency of Social Gatherings with Friends and Family: Not on file  . Attends Religious Services: Not on file  . Active Member of Clubs or Organizations: Not on file  . Attends Archivist Meetings: Not on file  . Marital Status: Not on file    Activities of Daily Living In your present state of health, do you have any difficulty performing the following activities: 09/20/2019  Hearing? Y  Comment bilateral earing aids  Vision? Y  Comment wears RX glasses - torn retina <3 yrs ago  Difficulty concentrating or making decisions? N  Walking  or climbing stairs? N  Dressing or bathing? N  Doing errands, shopping? N  Preparing Food and eating ? N  Using the Toilet? N  In the past six months, have you accidently leaked urine? N  Do you have problems with loss of bowel control? N  Managing your Medications? N  Managing your Finances? N  Housekeeping or managing your Housekeeping? N  Some recent data might be hidden    Patient Education/ Literacy    Exercise Current Exercise Habits: Home exercise routine, Type of exercise: walking, Time (Minutes): 45, Frequency (Times/Week): 7, Weekly Exercise (Minutes/Week): 315, Intensity: Mild, Exercise limited by: None identified  Diet Patient  reports consuming 2 meals a day and 1 snack(s) a day Patient reports that his primary diet is: Regular Patient reports that she does have regular access to food.   Depression Screen PHQ 2/9 Scores 09/20/2019 09/02/2019 08/07/2019  PHQ - 2 Score 0 0 0  PHQ- 9 Score - 0 0     Fall Risk Fall Risk  09/20/2019 09/02/2019 08/07/2019  Falls in the past year? 0 0 0     Objective:  Aedin Saucer seemed alert and oriented and he participated appropriately during our telephone visit.  Blood Pressure Weight BMI  BP Readings from Last 3 Encounters:  09/20/19 125/75  09/02/19 127/81  08/12/19 (!) 199/97   Wt Readings from Last 3 Encounters:  09/20/19 196 lb (88.9 kg)  09/02/19 196 lb (88.9 kg)  08/07/19 195 lb (88.5 kg)   BMI Readings from Last 1 Encounters:  09/20/19 26.58 kg/m    *Unable to obtain current vital signs, weight, and BMI due to telephone visit type  Hearing/Vision  . Justo did not seem to have difficulty with hearing/understanding during the telephone conversation . Reports that he has not had a formal eye exam by an eye care professional within the past year / has one scheduled . Reports that he has not had a formal hearing evaluation within the past year *Unable to fully assess hearing and vision during telephone visit  type  Cognitive Function: 6CIT Screen 09/20/2019  What Year? 0 points  What month? 0 points  What time? 0 points  Count back from 20 0 points  Months in reverse 0 points  Repeat phrase 2 points  Total Score 2   (Normal:0-7, Significant for Dysfunction: >8)  Normal Cognitive Function Screening: Yes   Immunization & Health Maintenance Record  There is no immunization history on file for this patient.  Health Maintenance  Topic Date Due  . INFLUENZA VACCINE  11/20/2019 (Originally 03/23/2019)  . TETANUS/TDAP  09/01/2020 (Originally 11/06/1969)  . PNA vac Low Risk Adult (1 of 2 - PCV13) 09/01/2020 (Originally 11/05/2016)  . Hepatitis C Screening  09/19/2020 (Originally [birth year])  . COLONOSCOPY  12/14/2027       Assessment  This is a routine wellness examination for Emeri Mcbreen.  Health Maintenance: Due or Overdue There are no preventive care reminders to display for this patient.  Ralph Stevens does not need a referral for Community Assistance: Care Management:   no Social Work:    no Prescription Assistance:  no Nutrition/Diabetes Education:  no   Plan:  Personalized Goals Goals Addressed            This Visit's Progress   . Prevent falls       Stay active / healthy       Personalized Health Maintenance & Screening Recommendations  Td vaccine and PREV 13  Lung Cancer Screening Recommended: no (Low Dose CT Chest recommended if Age 69-80 years, 30 pack-year currently smoking OR have quit w/in past 15 years) Hepatitis C Screening recommended: no HIV Screening recommended: no  Advanced Directives: Written information was not prepared per patient's request.  Referrals & Orders No orders of the defined types were placed in this encounter.   Follow-up Plan . Follow-up with Ralph Norlander, DO as planned    I have personally reviewed and noted the following in the patient's chart:   . Medical and social history . Use of alcohol, tobacco or illicit  drugs  . Current medications and supplements . Functional ability and status .  Nutritional status . Physical activity . Advanced directives . List of other physicians . Hospitalizations, surgeries, and ER visits in previous 12 months . Vitals . Screenings to include cognitive, depression, and falls . Referrals and appointments  In addition, I have reviewed and discussed with Ralph Stevens certain preventive protocols, quality metrics, and best practice recommendations. A written personalized care plan for preventive services as well as general preventive health recommendations is available and can be mailed to the patient at his request.      [place] Dec  09/20/2019

## 2019-09-24 ENCOUNTER — Encounter: Payer: Self-pay | Admitting: Urology

## 2019-09-24 ENCOUNTER — Other Ambulatory Visit: Payer: Self-pay | Admitting: Urology

## 2019-09-24 ENCOUNTER — Other Ambulatory Visit: Payer: Self-pay

## 2019-09-24 ENCOUNTER — Ambulatory Visit (INDEPENDENT_AMBULATORY_CARE_PROVIDER_SITE_OTHER): Payer: Medicare Other | Admitting: Urology

## 2019-09-24 VITALS — BP 156/84 | HR 105 | Temp 97.3°F | Ht 72.0 in | Wt 193.0 lb

## 2019-09-24 DIAGNOSIS — R350 Frequency of micturition: Secondary | ICD-10-CM | POA: Diagnosis not present

## 2019-09-24 DIAGNOSIS — N401 Enlarged prostate with lower urinary tract symptoms: Secondary | ICD-10-CM

## 2019-09-24 MED ORDER — ALFUZOSIN HCL ER 10 MG PO TB24: 10.0000 mg | ORAL_TABLET | Freq: Every day | ORAL | 3 refills | Status: DC

## 2019-09-24 NOTE — Progress Notes (Signed)
Urological Symptom Review  Patient is experiencing the following symptoms: Get up at night to urinate Weak stream Erection problems (male only)   Review of Systems  Gastrointestinal (upper)  : Negative for upper GI symptoms  Gastrointestinal (lower) : Negative for lower GI symptoms  Constitutional : Negative for symptoms  Skin: Negative for skin symptoms  Eyes: Negative for eye symptoms  Ear/Nose/Throat : Negative for Ear/Nose/Throat symptoms  Hematologic/Lymphatic: Negative for Hematologic/Lymphatic symptoms  Cardiovascular : Negative for cardiovascular symptoms  Respiratory : Negative for respiratory symptoms  Endocrine: Negative for endocrine symptoms  Musculoskeletal: Negative for musculoskeletal symptoms  Neurological: Negative for neurological symptoms  Psychologic: Negative for psychiatric symptoms

## 2019-09-24 NOTE — Progress Notes (Signed)
H&P  Chief Complaint: Elevated PSA and Prostate Check  History of Present Illness:   2.2.2021: Here today referred by Dr Lajuana Ripple for follow-up on his elevated PSA and 'abnormally shaped' prostate w/ mild LUTS. He is currently on tamsulosin -- previously on dual medical therapy w/ alfuzosin but stopped because he was having issues with it in combination w/ HCTZ (peeing too frequently). His PSA has apparently fluctuated over the last few years (at one point he thinks it was up to 6, but it came back down to what he says was normal for him), but he does not have any old records for these data.   IPSS Questionnaire (AUA-7): Over the past month.   1)  How often have you had a sensation of not emptying your bladder completely after you finish urinating?  1 - Less than 1 time in 5  2)  How often have you had to urinate again less than two hours after you finished urinating? 2 - Less than half the time  3)  How often have you found you stopped and started again several times when you urinated?  1 - Less than 1 time in 5  4) How difficult have you found it to postpone urination?  0 - Not at all  5) How often have you had a weak urinary stream?  5 - Almost always  6) How often have you had to push or strain to begin urination?  1 - Less than 1 time in 5  7) How many times did you most typically get up to urinate from the time you went to bed until the time you got up in the morning?  1 - 1 time  Total score:  0-7 mildly symptomatic   8-19 moderately symptomatic   20-35 severely symptomatic   IPSS: 11 QoL: 3  Past Medical History:  Diagnosis Date  . Acid reflux   . Arthritis   . Arthritis   . Enlarged prostate    PSA goes up and down   . GERD (gastroesophageal reflux disease)   . High cholesterol   . Hyperlipidemia   . Hypertension     Past Surgical History:  Procedure Laterality Date  . TONSILLECTOMY     age 69     Home Medications:  Allergies as of 09/24/2019   No Known  Allergies     Medication List       Accurate as of September 24, 2019  9:48 AM. If you have any questions, ask your nurse or doctor.        alfuzosin 10 MG 24 hr tablet Commonly known as: UROXATRAL Take 10 mg by mouth daily with breakfast.   amLODipine 5 MG tablet Commonly known as: NORVASC Take 1 tablet (5 mg total) by mouth daily.   aspirin 81 MG chewable tablet Chew by mouth daily.   atorvastatin 10 MG tablet Commonly known as: LIPITOR Take 10 mg by mouth daily.   esomeprazole 20 MG capsule Commonly known as: NEXIUM Take 20 mg by mouth daily at 12 noon.   fluticasone 50 MCG/ACT nasal spray Commonly known as: FLONASE Place into both nostrils daily.   hydrochlorothiazide 12.5 MG capsule Commonly known as: MICROZIDE Take 12.5 mg by mouth daily.   lisinopril 40 MG tablet Commonly known as: ZESTRIL Take 40 mg by mouth daily.   metoprolol tartrate 50 MG tablet Commonly known as: LOPRESSOR Take 1 tablet (50 mg total) by mouth 2 (two) times daily.   tamsulosin 0.4 MG  Caps capsule Commonly known as: FLOMAX Take 0.4 mg by mouth daily after supper.       Allergies: No Known Allergies  Family History  Problem Relation Age of Onset  . Cancer Mother   . Colon cancer Mother   . Dementia Mother   . Alzheimer's disease Mother   . Emphysema Father   . Hypothyroidism Sister   . Thyroid disease Sister   . Hypertension Sister   . Multiple sclerosis Daughter   . Heart defect Daughter   . Cancer Maternal Grandmother        colon  . Heart disease Maternal Grandfather   . Heart disease Paternal Grandmother   . Cancer Paternal Grandfather        unknown ( mouth/throat)    Social History:  reports that he has never smoked. He has never used smokeless tobacco. He reports that he does not drink alcohol or use drugs.  ROS: A complete review of systems was performed.  All systems are negative except for pertinent findings as noted.  Physical Exam:  Vital signs in  last 24 hours: BP (!) 156/84   Pulse (!) 105   Temp (!) 97.3 F (36.3 C)   Ht 6' (1.829 m)   Wt 193 lb (87.5 kg)   BMI 26.18 kg/m  Constitutional:  Alert and oriented, No acute distress Cardiovascular: Regular rate  Respiratory: Normal respiratory effort GI: Abdomen is soft, nontender, nondistended, no abdominal masses. No CVAT. No hernias. Genitourinary: Normal male phallus (uncicumsised), testes are descended bilaterally and non-tender and without masses, scrotum is normal in appearance without lesions or masses, perineum is normal on inspection. Lymphatic: No lymphadenopathy. Prostate feels about 60 grams (R lobe > Left lobe) Neurologic: Grossly intact, no focal deficits Psychiatric: Normal mood and affect  Laboratory Data:  No results for input(s): WBC, HGB, HCT, PLT in the last 72 hours.  No results for input(s): NA, K, CL, GLUCOSE, BUN, CALCIUM, CREATININE in the last 72 hours.  Invalid input(s): CO3   No results found for this or any previous visit (from the past 24 hour(s)). No results found for this or any previous visit (from the past 240 hour(s)).  Renal Function: No results for input(s): CREATININE in the last 168 hours. CrCl cannot be calculated (Patient's most recent lab result is older than the maximum 21 days allowed.).  Radiologic Imaging: No results found.  I have reviewed prior pt notes  I have reviewed notes from referring/previous physicians  I have independently reviewed prior imaging re[port  IPSS reviewrd   Impression/Assessment:  Prostate exam normal -- only abnormality was that his right lobe > left, but no nodules or atypical shape as he thought. Given he is no longer on HCTZ, and felt like afuzosin was better for his mild LUTS than tamsulosin so he is fine to be switched to alfuzosin. Negative prostate MRI prior this visit but records of his prior PSA's are not available.   Plan:  1. PSA today -- will forward results. We discussed next steps  for surveillance of this if his PSA remains elevated.  2. Resume alfuzosin, discontinue tamsulosin  3. Will also refill his rx for PGE injections for him to resume medical management of his ED  4. If PSA is nonsuspicious for him, will have him return in 6 mo's for follow-up.

## 2019-09-25 LAB — PSA: PSA: 2.5 ng/mL (ref <=4.0)

## 2019-09-30 ENCOUNTER — Other Ambulatory Visit: Payer: Self-pay

## 2019-09-30 ENCOUNTER — Ambulatory Visit (INDEPENDENT_AMBULATORY_CARE_PROVIDER_SITE_OTHER): Payer: Medicare Other | Admitting: Ophthalmology

## 2019-09-30 ENCOUNTER — Encounter (INDEPENDENT_AMBULATORY_CARE_PROVIDER_SITE_OTHER): Payer: Self-pay | Admitting: Ophthalmology

## 2019-09-30 DIAGNOSIS — H25813 Combined forms of age-related cataract, bilateral: Secondary | ICD-10-CM

## 2019-09-30 DIAGNOSIS — I10 Essential (primary) hypertension: Secondary | ICD-10-CM

## 2019-09-30 DIAGNOSIS — H3581 Retinal edema: Secondary | ICD-10-CM

## 2019-09-30 DIAGNOSIS — H35033 Hypertensive retinopathy, bilateral: Secondary | ICD-10-CM | POA: Diagnosis not present

## 2019-09-30 DIAGNOSIS — H35372 Puckering of macula, left eye: Secondary | ICD-10-CM

## 2019-09-30 DIAGNOSIS — Z9889 Other specified postprocedural states: Secondary | ICD-10-CM

## 2019-10-08 ENCOUNTER — Telehealth: Payer: Self-pay

## 2019-10-08 NOTE — Telephone Encounter (Signed)
-----   Message from Franchot Gallo, MD sent at 10/08/2019  4:29 PM EST ----- Notify pt--psa nml--2.6 ----- Message ----- From: Dorisann Frames, RN Sent: 09/25/2019   8:32 AM EST To: Franchot Gallo, MD  Please review

## 2019-10-08 NOTE — Telephone Encounter (Signed)
Sent via Mychart.

## 2019-12-20 ENCOUNTER — Telehealth: Payer: Self-pay | Admitting: Family Medicine

## 2019-12-20 MED ORDER — LISINOPRIL 40 MG PO TABS: 40.0000 mg | ORAL_TABLET | Freq: Every day | ORAL | 0 refills | Status: DC

## 2019-12-20 NOTE — Telephone Encounter (Signed)
Med sent.

## 2019-12-20 NOTE — Telephone Encounter (Signed)
  Prescription Request  12/20/2019  What is the name of the medication or equipment? lisinopril (ZESTRIL) 40 MG tablet   Have you contacted your pharmacy to request a refill? (if applicable) yes  Which pharmacy would you like this sent to? [facility], pt has 6 month f/u appt with DR. G on 03/02/2020   Patient notified that their request is being sent to the clinical staff for review and that they should receive a response within 2 business days.

## 2020-01-01 ENCOUNTER — Emergency Department (HOSPITAL_COMMUNITY): Payer: Medicare Other

## 2020-01-01 ENCOUNTER — Telehealth: Payer: Self-pay | Admitting: Family Medicine

## 2020-01-01 ENCOUNTER — Encounter (HOSPITAL_COMMUNITY): Payer: Self-pay | Admitting: Emergency Medicine

## 2020-01-01 ENCOUNTER — Inpatient Hospital Stay (HOSPITAL_COMMUNITY)
Admission: EM | Admit: 2020-01-01 | Discharge: 2020-01-04 | DRG: 872 | Disposition: A | Discharge status: Home / Self Care | Payer: Medicare Other | Attending: Internal Medicine | Admitting: Internal Medicine

## 2020-01-01 ENCOUNTER — Other Ambulatory Visit: Payer: Self-pay

## 2020-01-01 DIAGNOSIS — Z79899 Other long term (current) drug therapy: Secondary | ICD-10-CM | POA: Diagnosis not present

## 2020-01-01 DIAGNOSIS — A4151 Sepsis due to Escherichia coli [E. coli]: Secondary | ICD-10-CM | POA: Diagnosis present

## 2020-01-01 DIAGNOSIS — Z03818 Encounter for observation for suspected exposure to other biological agents ruled out: Secondary | ICD-10-CM | POA: Diagnosis not present

## 2020-01-01 DIAGNOSIS — Z800 Family history of malignant neoplasm of digestive organs: Secondary | ICD-10-CM | POA: Diagnosis not present

## 2020-01-01 DIAGNOSIS — M1990 Unspecified osteoarthritis, unspecified site: Secondary | ICD-10-CM | POA: Diagnosis not present

## 2020-01-01 DIAGNOSIS — R809 Proteinuria, unspecified: Secondary | ICD-10-CM | POA: Diagnosis not present

## 2020-01-01 DIAGNOSIS — Z7982 Long term (current) use of aspirin: Secondary | ICD-10-CM | POA: Diagnosis not present

## 2020-01-01 DIAGNOSIS — A419 Sepsis, unspecified organism: Secondary | ICD-10-CM | POA: Diagnosis present

## 2020-01-01 DIAGNOSIS — N390 Urinary tract infection, site not specified: Secondary | ICD-10-CM | POA: Diagnosis present

## 2020-01-01 DIAGNOSIS — R000 Tachycardia, unspecified: Secondary | ICD-10-CM | POA: Diagnosis not present

## 2020-01-01 DIAGNOSIS — R350 Frequency of micturition: Secondary | ICD-10-CM | POA: Diagnosis not present

## 2020-01-01 DIAGNOSIS — E785 Hyperlipidemia, unspecified: Secondary | ICD-10-CM | POA: Diagnosis present

## 2020-01-01 DIAGNOSIS — Z8249 Family history of ischemic heart disease and other diseases of the circulatory system: Secondary | ICD-10-CM

## 2020-01-01 DIAGNOSIS — Z8349 Family history of other endocrine, nutritional and metabolic diseases: Secondary | ICD-10-CM | POA: Diagnosis not present

## 2020-01-01 DIAGNOSIS — R509 Fever, unspecified: Secondary | ICD-10-CM | POA: Diagnosis not present

## 2020-01-01 DIAGNOSIS — Z820 Family history of epilepsy and other diseases of the nervous system: Secondary | ICD-10-CM

## 2020-01-01 DIAGNOSIS — R81 Glycosuria: Secondary | ICD-10-CM | POA: Diagnosis present

## 2020-01-01 DIAGNOSIS — Q752 Hypertelorism: Secondary | ICD-10-CM

## 2020-01-01 DIAGNOSIS — Z825 Family history of asthma and other chronic lower respiratory diseases: Secondary | ICD-10-CM

## 2020-01-01 DIAGNOSIS — I10 Essential (primary) hypertension: Secondary | ICD-10-CM | POA: Diagnosis not present

## 2020-01-01 DIAGNOSIS — N401 Enlarged prostate with lower urinary tract symptoms: Secondary | ICD-10-CM | POA: Diagnosis present

## 2020-01-01 DIAGNOSIS — K219 Gastro-esophageal reflux disease without esophagitis: Secondary | ICD-10-CM | POA: Diagnosis present

## 2020-01-01 DIAGNOSIS — Z20822 Contact with and (suspected) exposure to covid-19: Secondary | ICD-10-CM | POA: Diagnosis present

## 2020-01-01 DIAGNOSIS — E876 Hypokalemia: Secondary | ICD-10-CM | POA: Diagnosis not present

## 2020-01-01 LAB — URINALYSIS, MICROSCOPIC (REFLEX)

## 2020-01-01 LAB — CBC WITH DIFFERENTIAL/PLATELET
Abs Immature Granulocytes: 0.2 10*3/uL — ABNORMAL HIGH (ref 0.00–0.07)
Basophils Absolute: 0 10*3/uL (ref 0.0–0.1)
Basophils Relative: 0 %
Eosinophils Absolute: 0 10*3/uL (ref 0.0–0.5)
Eosinophils Relative: 0 %
HCT: 40.5 % (ref 39.0–52.0)
Hemoglobin: 13.7 g/dL (ref 13.0–17.0)
Immature Granulocytes: 1 %
Lymphocytes Relative: 6 %
Lymphs Abs: 0.9 10*3/uL (ref 0.7–4.0)
MCH: 30.2 pg (ref 26.0–34.0)
MCHC: 33.8 g/dL (ref 30.0–36.0)
MCV: 89.4 fL (ref 80.0–100.0)
Monocytes Absolute: 0.8 10*3/uL (ref 0.1–1.0)
Monocytes Relative: 5 %
Neutro Abs: 13.4 10*3/uL — ABNORMAL HIGH (ref 1.7–7.7)
Neutrophils Relative %: 88 %
Platelets: 259 10*3/uL (ref 150–400)
RBC: 4.53 MIL/uL (ref 4.22–5.81)
RDW: 13 % (ref 11.5–15.5)
WBC: 15.4 10*3/uL — ABNORMAL HIGH (ref 4.0–10.5)
nRBC: 0 % (ref 0.0–0.2)

## 2020-01-01 LAB — COMPREHENSIVE METABOLIC PANEL
ALT: 16 U/L (ref 0–44)
AST: 14 U/L — ABNORMAL LOW (ref 15–41)
Albumin: 3.1 g/dL — ABNORMAL LOW (ref 3.5–5.0)
Alkaline Phosphatase: 77 U/L (ref 38–126)
Anion gap: 10 (ref 5–15)
BUN: 13 mg/dL (ref 8–23)
CO2: 22 mmol/L (ref 22–32)
Calcium: 8.3 mg/dL — ABNORMAL LOW (ref 8.9–10.3)
Chloride: 105 mmol/L (ref 98–111)
Creatinine, Ser: 1.01 mg/dL (ref 0.61–1.24)
GFR calc Af Amer: >60 mL/min (ref >60)
GFR calc non Af Amer: >60 mL/min (ref >60)
Glucose, Bld: 140 mg/dL — ABNORMAL HIGH (ref 70–99)
Potassium: 2.8 mmol/L — ABNORMAL LOW (ref 3.5–5.1)
Sodium: 137 mmol/L (ref 135–145)
Total Bilirubin: 1.2 mg/dL (ref 0.3–1.2)
Total Protein: 7.1 g/dL (ref 6.5–8.1)

## 2020-01-01 LAB — APTT: aPTT: 38 seconds — ABNORMAL HIGH (ref 24–36)

## 2020-01-01 LAB — URINALYSIS, ROUTINE W REFLEX MICROSCOPIC
Glucose, UA: 250 mg/dL — AB
Nitrite: POSITIVE — AB
Protein, ur: >300 mg/dL — AB
Specific Gravity, Urine: 1.02 (ref 1.005–1.030)
pH: 5 (ref 5.0–8.0)

## 2020-01-01 LAB — LACTIC ACID, PLASMA
Lactic Acid, Venous: 1.4 mmol/L (ref 0.5–1.9)
Lactic Acid, Venous: 1.1 mmol/L (ref 0.5–1.9)

## 2020-01-01 LAB — PROTIME-INR
INR: 1.1 (ref 0.8–1.2)
Prothrombin Time: 13.6 seconds (ref 11.4–15.2)

## 2020-01-01 MED ORDER — METOPROLOL TARTRATE 5 MG/5ML IV SOLN
5.0000 mg | Freq: Once | INTRAVENOUS | Status: AC
  Administered 2020-01-01: 5 mg via INTRAVENOUS
  Filled 2020-01-01: qty 5

## 2020-01-01 MED ORDER — ATORVASTATIN CALCIUM 10 MG PO TABS
10.0000 mg | ORAL_TABLET | Freq: Every day | ORAL | Status: DC
  Administered 2020-01-01 – 2020-01-03 (×3): 10 mg via ORAL
  Filled 2020-01-01 (×3): qty 1

## 2020-01-01 MED ORDER — SODIUM CHLORIDE 0.9 % IV SOLN
1.0000 g | INTRAVENOUS | Status: DC
  Administered 2020-01-01 – 2020-01-03 (×3): 1 g via INTRAVENOUS
  Filled 2020-01-01 (×4): qty 10

## 2020-01-01 MED ORDER — LISINOPRIL 10 MG PO TABS
40.0000 mg | ORAL_TABLET | Freq: Every day | ORAL | Status: DC
  Administered 2020-01-02 – 2020-01-04 (×3): 40 mg via ORAL
  Filled 2020-01-01 (×3): qty 4

## 2020-01-01 MED ORDER — SODIUM CHLORIDE 0.9 % IV BOLUS (SEPSIS)
1000.0000 mL | Freq: Once | INTRAVENOUS | Status: AC
  Administered 2020-01-01: 1000 mL via INTRAVENOUS

## 2020-01-01 MED ORDER — METOPROLOL TARTRATE 50 MG PO TABS
50.0000 mg | ORAL_TABLET | Freq: Two times a day (BID) | ORAL | Status: DC
  Administered 2020-01-02 – 2020-01-04 (×5): 50 mg via ORAL
  Filled 2020-01-01 (×5): qty 1

## 2020-01-01 MED ORDER — ALFUZOSIN HCL ER 10 MG PO TB24
10.0000 mg | ORAL_TABLET | Freq: Every day | ORAL | Status: DC
  Administered 2020-01-02: 10 mg via ORAL
  Filled 2020-01-01 (×2): qty 1

## 2020-01-01 MED ORDER — POTASSIUM CHLORIDE CRYS ER 20 MEQ PO TBCR
40.0000 meq | EXTENDED_RELEASE_TABLET | Freq: Once | ORAL | Status: AC
  Administered 2020-01-01: 40 meq via ORAL
  Filled 2020-01-01: qty 2

## 2020-01-01 MED ORDER — FLUTICASONE PROPIONATE 50 MCG/ACT NA SUSP: 1.0000 | Freq: Every day | NASAL | Status: DC | PRN

## 2020-01-01 MED ORDER — KETOROLAC TROMETHAMINE 15 MG/ML IJ SOLN
15.0000 mg | Freq: Once | INTRAMUSCULAR | Status: AC
  Administered 2020-01-01: 15 mg via INTRAVENOUS
  Filled 2020-01-01: qty 1

## 2020-01-01 MED ORDER — POTASSIUM CHLORIDE IN NACL 20-0.45 MEQ/L-% IV SOLN
INTRAVENOUS | Status: DC
  Filled 2020-01-01 (×4): qty 1000

## 2020-01-01 MED ORDER — MAGNESIUM SULFATE 2 GM/50ML IV SOLN
2.0000 g | Freq: Once | INTRAVENOUS | Status: AC
  Administered 2020-01-01: 2 g via INTRAVENOUS
  Filled 2020-01-01: qty 50

## 2020-01-01 MED ORDER — ASPIRIN 81 MG PO CHEW
81.0000 mg | CHEWABLE_TABLET | Freq: Every morning | ORAL | Status: DC
  Administered 2020-01-02 – 2020-01-04 (×3): 81 mg via ORAL
  Filled 2020-01-01 (×3): qty 1

## 2020-01-01 MED ORDER — AMLODIPINE BESYLATE 5 MG PO TABS
5.0000 mg | ORAL_TABLET | Freq: Every day | ORAL | Status: DC
  Administered 2020-01-02 – 2020-01-04 (×3): 5 mg via ORAL
  Filled 2020-01-01 (×3): qty 1

## 2020-01-01 MED ORDER — SODIUM CHLORIDE 0.9 % IV BOLUS
1000.0000 mL | Freq: Once | INTRAVENOUS | Status: AC
  Administered 2020-01-01: 1000 mL via INTRAVENOUS

## 2020-01-01 MED ORDER — ACETAMINOPHEN 500 MG PO TABS
1000.0000 mg | ORAL_TABLET | Freq: Once | ORAL | Status: AC
  Administered 2020-01-01: 1000 mg via ORAL
  Filled 2020-01-01: qty 2

## 2020-01-01 MED ORDER — HYDROCHLOROTHIAZIDE 12.5 MG PO CAPS: 12.5000 mg | ORAL_CAPSULE | Freq: Every day | ORAL | Status: DC

## 2020-01-01 MED ORDER — PANTOPRAZOLE SODIUM 40 MG PO TBEC
40.0000 mg | DELAYED_RELEASE_TABLET | Freq: Every day | ORAL | Status: DC
  Administered 2020-01-02 – 2020-01-04 (×3): 40 mg via ORAL
  Filled 2020-01-01 (×4): qty 1

## 2020-01-01 MED ORDER — POTASSIUM CHLORIDE 10 MEQ/100ML IV SOLN
10.0000 meq | Freq: Once | INTRAVENOUS | Status: AC
  Administered 2020-01-01: 10 meq via INTRAVENOUS
  Filled 2020-01-01: qty 100

## 2020-01-01 NOTE — ED Notes (Signed)
Pt up to bathroom.

## 2020-01-01 NOTE — ED Notes (Signed)
Patient attempted to stand to use urinal, patient's heart rate went up to 160. Patient is back in bed with elevated heart rate. Patient seems to be breathing heavy asked patient if he felt short of breath, patient states that he just feels weak. EKG done and given to Dr Roderic Palau. PA K Albrizze is also aware.

## 2020-01-01 NOTE — Telephone Encounter (Signed)
Patient advised to stay hydrated can drink cranberry juice and take AZo otc to help relive symptoms till he gets in. Patient verbalized understanding

## 2020-01-01 NOTE — ED Notes (Signed)
Patient's heart rate  elevated. Upon entering room patient standing to use restroom.

## 2020-01-01 NOTE — H&P (Signed)
History and Physical    Ralph Stevens [id number] DOB: 1951/03/14 DOA: 01/01/2020  PCP: [name], DO   Patient coming from: Home.  I have personally briefly reviewed patient's old medical records in [place]  Chief Complaint: Burning and frequency with urination.  HPI: Ralph Stevens is a 69 y.o. male with medical history significant of acid reflux, osteoarthritis, BPH, GERD, hyperlipidemia, hypertension who is coming to the emergency department due to progressively worse urinary burning and frequency since 3 days ago, Sunday.  He tried Azo without significant results.  He has not had a retrodischarge.  He denies oliguria, flank pain or gross hematuria.  Earlier today, he has been feeling warm.  He took his temperature at home and it was 100.27F.  He has been with decreased appetite, fatigue, and malaise.  He denies rhinorrhea, sore throat, wheezing, dyspnea, hemoptysis, chest pain, palpitations, dizziness, diaphoresis, PND, orthopnea or pitting edema of the lower extremities.  He denies abdominal pain, nausea, vomiting, diarrhea, constipation, melena or hematochezia.  No polyuria, polydipsia, polyphagia or blurred vision.  ED Course: Initial vital signs temperature 100.5 F, pulse 145, respirations 22, blood pressure 139/76 mmHg and O2 sat 94% on room air.  The patient was given 2000 mL of NS bolus.  He also received ceftriaxone 1 g IVPB along with oral and IV potassium replacement.  I added magnesium sulfate 2 g IVPB, metoprolol 5 mg IVP x1 and ketorolac 15 mg x 2 6 hours apart.  His urinalysis has glucosuria up to 50 and proteinuria more than 300 mg/dL.  Nitrites was positive.  Leukocyte esterase was large.  WBC 21-50 per hpf and many bacteria are seen on hpf.  CBC showed a white count is 15.4, hemoglobin 13.7 g/dL and platelets 259.  PT 13.6, INR 1.1 and APTT 38.  Lactic acid was 1.4 and then 1.1 mmol/L.  CMP shows a potassium of 2.9 mmol/L.  All other electrolytes are within  normal range when calcium is corrected to albumin.  Renal function is normal.  Glucose 140 mg/dL.  Albumin was 3.1 g/dL.  The rest of the hepatic function tests are unremarkable.  Review of Systems: As per HPI otherwise all other systems reviewed and are negative.  Past Medical History:  Diagnosis Date  . Acid reflux   . Arthritis   . Arthritis   . Enlarged prostate    PSA goes up and down   . GERD (gastroesophageal reflux disease)   . High cholesterol   . Hyperlipidemia   . Hypertension     Past Surgical History:  Procedure Laterality Date  . EYE SURGERY    . TONSILLECTOMY     age 5     Social History  reports that he has never smoked. He has never used smokeless tobacco. He reports that he does not drink alcohol or use drugs.  No Known Allergies  Family History  Problem Relation Age of Onset  . Cancer Mother   . Colon cancer Mother   . Dementia Mother   . Alzheimer's disease Mother   . Emphysema Father   . Hypothyroidism Sister   . Thyroid disease Sister   . Hypertension Sister   . Multiple sclerosis Daughter   . Heart defect Daughter   . Cancer Maternal Grandmother        colon  . Heart disease Maternal Grandfather   . Heart disease Paternal Grandmother   . Cancer Paternal Grandfather        unknown ( mouth/throat)  Prior to Admission medications   Medication Sig Start Date End Date Taking? Authorizing Provider  alfuzosin (UROXATRAL) 10 MG 24 hr tablet Take 1 tablet (10 mg total) by mouth daily with breakfast. 09/24/19  Yes Dahlstedt, Annie Main, MD  amLODipine (NORVASC) 5 MG tablet Take 1 tablet (5 mg total) by mouth daily. 09/02/19  Yes Ronnie Doss M, DO  aspirin 81 MG chewable tablet Chew 81 mg by mouth in the morning.    Yes [provider]  atorvastatin (LIPITOR) 10 MG tablet Take 10 mg by mouth at bedtime.  06/10/19  Yes [provider]  esomeprazole (NEXIUM) 20 MG capsule Take 20 mg by mouth in the morning.    Yes [provider]  fluticasone (FLONASE) 50 MCG/ACT nasal spray Place 1-2 sprays into both nostrils daily as needed for allergies or rhinitis.    Yes [provider]  hydrochlorothiazide (MICROZIDE) 12.5 MG capsule Take 12.5 mg by mouth daily.   Yes [provider]  lisinopril (ZESTRIL) 40 MG tablet Take 1 tablet (40 mg total) by mouth daily. 12/20/19  Yes Ronnie Doss M, DO  metoprolol tartrate (LOPRESSOR) 50 MG tablet Take 1 tablet (50 mg total) by mouth 2 (two) times daily. 08/07/19  Yes [name], DO    Physical Exam: Vitals:   01/01/20 2055 01/01/20 2101 01/01/20 2107 01/01/20 2205  BP: (!) 152/85 (!) 158/92  (!) 173/87  Pulse: (!) 102  (!) 108 (!) 117  Resp: (!) 24  19 17   Temp: 98.6 F (37 C)     TempSrc: Oral     SpO2: 96% 97% 95% 97%  Weight:      Height:        Constitutional: Febrile.  Looks acutely ill. Eyes: PERRL, lids and conjunctivae normal ENMT: Mucous membranes are dry. Posterior pharynx clear of any exudate or lesions. Neck: normal, supple, no masses, no thyromegaly Respiratory: clear to auscultation bilaterally, no wheezing, no crackles. Normal respiratory effort. No accessory muscle use.  Cardiovascular: Tachycardic in the 120s, no murmurs / rubs / gallops. No extremity edema. 2+ pedal pulses. No carotid bruits.  Abdomen: Nondistended.  BS positive.  Soft, no tenderness, no masses palpated. No hepatosplenomegaly. GU: No flank tenderness.  Foley cath in place. Musculoskeletal: Generalized weakness.  No clubbing / cyanosis. Good ROM, no contractures. Normal muscle tone.  Skin: no rashes, lesions, ulcers on very limited dermatological examination. Neurologic: CN 2-12 grossly intact. Sensation intact, DTR normal. Strength 5/5 in all 4.  Psychiatric: Normal judgment and insight. Alert and oriented x 3. Normal mood.   Labs on Admission: I have personally reviewed following labs and imaging studies  CBC: Recent Labs  Lab 01/01/20 1804    WBC 15.4*  NEUTROABS 13.4*  HGB 13.7  HCT 40.5  MCV 89.4  PLT [id number]    Basic Metabolic Panel: Recent Labs  Lab 01/01/20 1804  NA 137  K 2.8*  CL 105  CO2 22  GLUCOSE 140*  BUN 13  CREATININE 1.01  CALCIUM 8.3*    GFR: Estimated Creatinine Clearance: 76.8 mL/min (by C-G formula based on SCr of 1.01 mg/dL).  Liver Function Tests: Recent Labs  Lab 01/01/20 1804  AST 14*  ALT 16  ALKPHOS 77  BILITOT 1.2  PROT 7.1  ALBUMIN 3.1*    Urine analysis:    Component Value Date/Time   COLORURINE ORANGE (A) 01/01/2020 1754   APPEARANCEUR CLOUDY (A) 01/01/2020 1754   LABSPEC 1.020 01/01/2020 1754   PHURINE 5.0  01/01/2020 1754   GLUCOSEU 250 (A) 01/01/2020 1754   HGBUR SMALL (A) 01/01/2020 1754   BILIRUBINUR SMALL (A) 01/01/2020 1754   KETONESUR TRACE (A) 01/01/2020 1754   PROTEINUR >300 (A) 01/01/2020 1754   NITRITE POSITIVE (A) 01/01/2020 1754   LEUKOCYTESUR LARGE (A) 01/01/2020 1754    Radiological Exams on Admission: No results found.  EKG: Independently reviewed. Vent. rate 126 BPM PR interval * ms QRS duration 78 ms QT/QTc 308/446 ms P-R-T axes 75 86 -12 Sinus tachycardia Borderline right axis deviation Nonspecific T abnormalities, inferior leads Baseline wander in lead(s) V2  Assessment/Plan Principal Problem:   Sepsis secondary to UTI present on admission.  ([place]) Admit to telemetry/inpatient. Continue IV fluids. Continue ceftriaxone 2 g IVPB every 24 hours. Follow-up blood culture and sensitivity. Follow-up urine culture and sensitivity.  Active Problems:   Hypokalemia Replacing. Magnesium was supplemented. Follow-up potassium level.    Benign prostatic hyperplasia with urinary frequency Continue Rosula trial 10 mg p.o. daily. Follow-up with urology as scheduled.    Essential hypertension Continue amlodipine 5 mg p.o. daily. Continue lisinopril 40 mg p.o. daily. Hold hydrochlorothiazide. Monitor BP, renal function electrolytes.     GERD (gastroesophageal reflux disease) Continue PPI.    Hyperlipidemia Continue atorvastatin 10 mg p.o. daily.       DVT prophylaxis: Lovenox SQ. Code Status:   Full code. Family Communication: Disposition Plan:   Patient is from:  Home.  Anticipated DC to:  Home.  Anticipated DC date:  01/03/2020 or 01/04/2020  Anticipated DC barriers: Clinical improvement and UC&S results. Consults called: Admission status:  Inpatient/telemetry.  Severity of Illness:  Reubin Milan MD [facility]  How to contact the [facility] Attending or Consulting provider [place] or covering provider during after hours [place], for this patient?   1. Check the care team in [facility] and look for a) attending/consulting [facility] provider listed and b) the [facility] team listed 2. Log into [email or web address] and use [place]'s universal password to access. If you do not have the password, please contact the hospital operator. 3. Locate the [facility] provider you are looking for under [facility] and page to a number that you can be directly reached. 4. If you still have difficulty reaching the provider, please page the [facility] (Director on Call) for the Hospitalists listed on amion for assistance.  01/01/2020, 10:56 PM   This document was prepared using Dragon voice recognition software and may contain some unintended transcription errors.

## 2020-01-01 NOTE — ED Provider Notes (Signed)
[facility] EMERGENCY DEPARTMENT Provider Note   CSN: [id number] Arrival date & time: 01/01/20  1719     History Chief Complaint  Patient presents with  . Dysuria    Ralph Stevens is a 69 y.o. male with past medical history significant for hypertension, hyperlipidemia, enlarged prostate, arthritis presents to emergency department today with chief complaint of dysuria x3 days.  He is endorsing urinary frequency and burning with urination.  He noticed today he felt hot and checked his temperature and found it to be 100.4.  His last dose of Tylenol was approximately 5 hours prior to arrival.  He took an Azo pill today.  He states he had no gross hematuria prior to taking the Azo pill however now urine is orange in color.  He is also endorsing body aches and chills.  He denies any chest pain, shortness of breath, abdominal pain, nausea, vomiting, flank pain, penile discharge, testicular or scrotal pain or swelling. He denies history of urinary tract infections. He does follow with urology to get prostate checked every x 6 months. Next appointment is 03/2020.    Past Medical History:  Diagnosis Date  . Acid reflux   . Arthritis   . Arthritis   . Enlarged prostate    PSA goes up and down   . GERD (gastroesophageal reflux disease)   . High cholesterol   . Hyperlipidemia   . Hypertension     Patient Active Problem List   Diagnosis Date Noted  . 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 Benign prostatic hyperplasia with urinary frequency 08/07/2019  . Essential hypertension 08/07/2019  . History of retinal tear 08/07/2019  . Actinic keratoses 08/07/2019    Past Surgical History:  Procedure Laterality Date  . EYE SURGERY    . TONSILLECTOMY     age 7        Family History  Problem Relation Age of Onset  . Cancer Mother   . Colon cancer Mother   . Dementia Mother   . Alzheimer's disease Mother   . Emphysema Father   . Hypothyroidism Sister   . Thyroid disease Sister   . Hypertension  Sister   . Multiple sclerosis Daughter   . Heart defect Daughter   . Cancer Maternal Grandmother        colon  . Heart disease Maternal Grandfather   . Heart disease Paternal Grandmother   . Cancer Paternal Grandfather        unknown ( mouth/throat)    Social History   Tobacco Use  . Smoking status: Never Smoker  . Smokeless tobacco: Never Used  Substance Use Topics  . Alcohol use: Never  . Drug use: Never    Home Medications Prior to Admission medications   Medication Sig Start Date End Date Taking? Authorizing Provider  alfuzosin (UROXATRAL) 10 MG 24 hr tablet Take 1 tablet (10 mg total) by mouth daily with breakfast. 09/24/19  Yes Dahlstedt, Annie Main, MD  amLODipine (NORVASC) 5 MG tablet Take 1 tablet (5 mg total) by mouth daily. 09/02/19  Yes Ronnie Doss M, DO  aspirin 81 MG chewable tablet Chew 81 mg by mouth in the morning.    Yes [provider]  atorvastatin (LIPITOR) 10 MG tablet Take 10 mg by mouth at bedtime.  06/10/19  Yes [provider]  esomeprazole (NEXIUM) 20 MG capsule Take 20 mg by mouth in the morning.    Yes [provider]  fluticasone (FLONASE) 50 MCG/ACT nasal spray Place 1-2 sprays into both  nostrils daily as needed for allergies or rhinitis.    Yes [provider]  hydrochlorothiazide (MICROZIDE) 12.5 MG capsule Take 12.5 mg by mouth daily.   Yes [provider]  lisinopril (ZESTRIL) 40 MG tablet Take 1 tablet (40 mg total) by mouth daily. 12/20/19  Yes Ronnie Doss M, DO  metoprolol tartrate (LOPRESSOR) 50 MG tablet Take 1 tablet (50 mg total) by mouth 2 (two) times daily. 08/07/19  Yes Ronnie Doss M, DO    Allergies    Patient has no known allergies.  Review of Systems   Review of Systems All other systems are reviewed and are negative for acute change except as noted in the HPI.  Physical Exam Updated Vital Signs BP 139/76 (BP Location: Left Arm)   Pulse (!) 145   Temp (!) 100.5 F  (38.1 C) (Oral)   Resp (!) 22   Ht 6' (1.829 m)   Wt 86.2 kg   SpO2 94%   BMI 25.77 kg/m   Physical Exam Vitals and nursing note reviewed.  Constitutional:      General: He is not in acute distress.    Appearance: He is not ill-appearing.  HENT:     Head: Normocephalic and atraumatic.     Right Ear: Tympanic membrane and external ear normal.     Left Ear: Tympanic membrane and external ear normal.     Nose: Nose normal.     Mouth/Throat:     Mouth: Mucous membranes are moist.     Pharynx: Oropharynx is clear.  Eyes:     General: No scleral icterus.       Right eye: No discharge.        Left eye: No discharge.     Extraocular Movements: Extraocular movements intact.     Conjunctiva/sclera: Conjunctivae normal.     Pupils: Pupils are equal, round, and reactive to light.  Neck:     Vascular: No JVD.  Cardiovascular:     Rate and Rhythm: Regular rhythm. Tachycardia present.     Pulses: Normal pulses.          Radial pulses are 2+ on the right side and 2+ on the left side.     Heart sounds: Normal heart sounds.  Pulmonary:     Comments: Lungs clear to auscultation in all fields. Symmetric chest rise. No wheezing, rales, or rhonchi. Abdominal:     General: Bowel sounds are normal.     Tenderness: There is no right CVA tenderness or left CVA tenderness.     Comments: Abdomen is soft, non-distended, and non-tender in all quadrants. No rigidity, no guarding. No peritoneal signs.  Musculoskeletal:        General: Normal range of motion.     Cervical back: Normal range of motion.  Skin:    General: Skin is warm and dry.     Capillary Refill: Capillary refill takes less than 2 seconds.  Neurological:     Mental Status: He is oriented to person, place, and time.     GCS: GCS eye subscore is 4. GCS verbal subscore is 5. GCS motor subscore is 6.     Comments: Fluent speech, no facial droop.  Psychiatric:        Behavior: Behavior normal.     ED Results / Procedures /  Treatments   Labs (all labs ordered are listed, but only abnormal results are displayed) Labs Reviewed  COMPREHENSIVE METABOLIC PANEL - Abnormal; Notable for the following components:  Result Value   Potassium 2.8 (*)    Glucose, Bld 140 (*)    Calcium 8.3 (*)    Albumin 3.1 (*)    AST 14 (*)    All other components within normal limits  CBC WITH DIFFERENTIAL/PLATELET - Abnormal; Notable for the following components:   WBC 15.4 (*)    Neutro Abs 13.4 (*)    Abs Immature Granulocytes 0.20 (*)    All other components within normal limits  APTT - Abnormal; Notable for the following components:   aPTT 38 (*)    All other components within normal limits  URINALYSIS, ROUTINE W REFLEX MICROSCOPIC - Abnormal; Notable for the following components:   Color, Urine ORANGE (*)    APPearance CLOUDY (*)    Glucose, UA 250 (*)    Hgb urine dipstick SMALL (*)    Bilirubin Urine SMALL (*)    Ketones, ur TRACE (*)    Protein, ur >300 (*)    Nitrite POSITIVE (*)    Leukocytes,Ua LARGE (*)    All other components within normal limits  URINALYSIS, MICROSCOPIC (REFLEX) - Abnormal; Notable for the following components:   Bacteria, UA MANY (*)    All other components within normal limits  CULTURE, BLOOD (ROUTINE X 2)  CULTURE, BLOOD (ROUTINE X 2)  URINE CULTURE  SARS CORONAVIRUS 2 BY RT PCR (HOSPITAL ORDER, [place] [facility])  LACTIC ACID, PLASMA  LACTIC ACID, PLASMA  PROTIME-INR    EKG EKG Interpretation  Date/Time:  Wednesday Jan 01 2020 18:13:48 EDT Ventricular Rate:  116 PR Interval:    QRS Duration: 78 QT Interval:  326 QTC Calculation: 453 R Axis:   66 Text Interpretation: Sinus tachycardia Nonspecific T abnormalities, inferior leads Confirmed by Milton Ferguson [id number]) on 01/01/2020 10:22:03 PM   Radiology No results found.  Procedures .Critical Care Performed by: Cherre Robins, PA-C Authorized by: Cherre Robins, PA-C   Critical care  provider statement:    Critical care time (minutes):  44   Critical care time was exclusive of:  Separately billable procedures and treating other patients and teaching time   Critical care was necessary to treat or prevent imminent or life-threatening deterioration of the following conditions:  Sepsis   Critical care was time spent personally by me on the following activities:  Discussions with consultants, evaluation of patient's response to treatment, examination of patient, ordering and performing treatments and interventions, ordering and review of laboratory studies, ordering and review of radiographic studies, pulse oximetry, re-evaluation of patient's condition, obtaining history from patient or surrogate and review of old charts   (including critical care time)  Medications Ordered in ED Medications  cefTRIAXone (ROCEPHIN) 1 g in sodium chloride 0.9 % 100 mL IVPB (0 g Intravenous Stopped 01/01/20 1844)  sodium chloride 0.9 % bolus 1,000 mL (0 mLs Intravenous Stopped 01/01/20 1905)  acetaminophen (TYLENOL) tablet 1,000 mg (1,000 mg Oral Given 01/01/20 1843)  potassium chloride SA (KLOR-CON) CR tablet 40 mEq (40 mEq Oral Given 01/01/20 1905)  potassium chloride 10 mEq in 100 mL IVPB (0 mEq Intravenous Stopped 01/01/20 2017)  sodium chloride 0.9 % bolus 1,000 mL (0 mLs Intravenous Stopped 01/01/20 2116)    ED Course  I have reviewed the triage vital signs and the nursing notes.  Pertinent labs & imaging results that were available during my care of the patient were reviewed by me and considered in my medical decision making (see chart for details).  Vitals:  01/01/20 2055 01/01/20 2101 01/01/20 2107 01/01/20 2205  BP: (!) 152/85 (!) 158/92  (!) 173/87  Pulse: (!) 102  (!) 108 (!) 117  Resp: (!) 24  19 17   Temp: 98.6 F (37 C)     TempSrc: Oral     SpO2: 96% 97% 95% 97%  Weight:      Height:          MDM Rules/Calculators/A&P                       History provided by patient  with additional history obtained from chart review.    Patient seen and examined. Patient presents awake, alert, hemodynamically stable. Patient is febrile to 100.5 and tachycardic to 145. On exam he has no abdominal tenderness, no peritoneal signs. No CVA tenderness.  Code sepsis initiated and antibiotics ordered for UTI and PO tylenol given. Patient is not in acute distress and is surprisingly well appearing. 1 L NS started as patient is not hypotensive so will hold off on 30cc/kg bolus. DDX includes UTI, pyelonephritis, prostatitis. Patient's age, exam and symptoms are not suggestive of testicular torsion. Labs are significant for leukocytosis of 15.4, hypokalemia of 2.8, will replete with PO and IV. No other severe electrolyte derangements. No lactic acidosis.  UA indicative of infection with positive nitrites, large leukocytes, and 21-50 WBCs. Urine culture sent. EKG without obvious ischemia. Patient given second liter of IVF. On reassessment patient's tachycardia has only minimally improved and he is afebrile. This case was discussed with Dr. Roderic Palau who has seen the patient and agrees with plan to admit.  Spoke with Dr. Olevia Bowens with hospitalist service who agrees to assume care of patient and bring into the hospital for further evaluation and management.     Portions of this note were generated with [employer]. Dictation errors may occur despite best attempts at proofreading.   Final Clinical Impression(s) / ED Diagnoses Final diagnoses:  Sepsis without acute organ dysfunction, due to unspecified organism [facility])    Rx / [place] Orders ED Discharge Orders    None       Flint Melter 01/01/20 2246    Milton Ferguson, MD 01/01/20 2308

## 2020-01-01 NOTE — ED Notes (Signed)
Patient states that he is not having any pain at this time. States he feels likes he has to urinate all the time.

## 2020-01-01 NOTE — ED Notes (Signed)
Pt given sprite 

## 2020-01-01 NOTE — ED Triage Notes (Signed)
Burning and frequency with urination since Sunday

## 2020-01-02 ENCOUNTER — Ambulatory Visit: Payer: Medicare Other | Admitting: Family

## 2020-01-02 DIAGNOSIS — N390 Urinary tract infection, site not specified: Secondary | ICD-10-CM

## 2020-01-02 DIAGNOSIS — A419 Sepsis, unspecified organism: Secondary | ICD-10-CM

## 2020-01-02 LAB — CBC WITH DIFFERENTIAL/PLATELET
Abs Immature Granulocytes: 0.11 10*3/uL — ABNORMAL HIGH (ref 0.00–0.07)
Basophils Absolute: 0 10*3/uL (ref 0.0–0.1)
Basophils Relative: 0 %
Eosinophils Absolute: 0 10*3/uL (ref 0.0–0.5)
Eosinophils Relative: 0 %
HCT: 34.2 % — ABNORMAL LOW (ref 39.0–52.0)
Hemoglobin: 11.6 g/dL — ABNORMAL LOW (ref 13.0–17.0)
Immature Granulocytes: 1 %
Lymphocytes Relative: 4 %
Lymphs Abs: 0.4 10*3/uL — ABNORMAL LOW (ref 0.7–4.0)
MCH: 30.5 pg (ref 26.0–34.0)
MCHC: 33.9 g/dL (ref 30.0–36.0)
MCV: 90 fL (ref 80.0–100.0)
Monocytes Absolute: 0.5 10*3/uL (ref 0.1–1.0)
Monocytes Relative: 5 %
Neutro Abs: 8.6 10*3/uL — ABNORMAL HIGH (ref 1.7–7.7)
Neutrophils Relative %: 90 %
Platelets: 210 10*3/uL (ref 150–400)
RBC: 3.8 MIL/uL — ABNORMAL LOW (ref 4.22–5.81)
RDW: 13.2 % (ref 11.5–15.5)
WBC: 9.6 10*3/uL (ref 4.0–10.5)
nRBC: 0 % (ref 0.0–0.2)

## 2020-01-02 LAB — BASIC METABOLIC PANEL
Anion gap: 7 (ref 5–15)
BUN: 12 mg/dL (ref 8–23)
CO2: 23 mmol/L (ref 22–32)
Calcium: 7.5 mg/dL — ABNORMAL LOW (ref 8.9–10.3)
Chloride: 107 mmol/L (ref 98–111)
Creatinine, Ser: 1.07 mg/dL (ref 0.61–1.24)
GFR calc Af Amer: >60 mL/min (ref >60)
GFR calc non Af Amer: >60 mL/min (ref >60)
Glucose, Bld: 116 mg/dL — ABNORMAL HIGH (ref 70–99)
Potassium: 3 mmol/L — ABNORMAL LOW (ref 3.5–5.1)
Sodium: 137 mmol/L (ref 135–145)

## 2020-01-02 LAB — SARS CORONAVIRUS 2 BY RT PCR (HOSPITAL ORDER, PERFORMED IN ~~LOC~~ HOSPITAL LAB): SARS Coronavirus 2: NEGATIVE

## 2020-01-02 MED ORDER — ENOXAPARIN SODIUM 40 MG/0.4ML ~~LOC~~ SOLN
40.0000 mg | SUBCUTANEOUS | Status: DC
  Administered 2020-01-02 – 2020-01-03 (×2): 40 mg via SUBCUTANEOUS
  Filled 2020-01-02 (×3): qty 0.4

## 2020-01-02 MED ORDER — SODIUM CHLORIDE 0.9 % IV SOLN: INTRAVENOUS | Status: DC

## 2020-01-02 MED ORDER — ONDANSETRON HCL 4 MG PO TABS: 4.0000 mg | ORAL_TABLET | Freq: Four times a day (QID) | ORAL | Status: DC | PRN

## 2020-01-02 MED ORDER — POTASSIUM CHLORIDE CRYS ER 20 MEQ PO TBCR
40.0000 meq | EXTENDED_RELEASE_TABLET | ORAL | Status: AC
  Administered 2020-01-02: 40 meq via ORAL
  Filled 2020-01-02: qty 2

## 2020-01-02 MED ORDER — SENNOSIDES-DOCUSATE SODIUM 8.6-50 MG PO TABS
2.0000 | ORAL_TABLET | Freq: Every evening | ORAL | Status: DC | PRN
  Filled 2020-01-02: qty 2

## 2020-01-02 MED ORDER — ONDANSETRON HCL 4 MG/2ML IJ SOLN: 4.0000 mg | Freq: Four times a day (QID) | INTRAMUSCULAR | Status: DC | PRN

## 2020-01-02 MED ORDER — ACETAMINOPHEN 650 MG RE SUPP: 650.0000 mg | Freq: Four times a day (QID) | RECTAL | Status: DC | PRN

## 2020-01-02 MED ORDER — ALFUZOSIN HCL ER 10 MG PO TB24
10.0000 mg | ORAL_TABLET | Freq: Every day | ORAL | Status: DC
  Administered 2020-01-03: 10 mg via ORAL
  Filled 2020-01-02 (×3): qty 1

## 2020-01-02 MED ORDER — CHLORHEXIDINE GLUCONATE CLOTH 2 % EX PADS
6.0000 | MEDICATED_PAD | Freq: Every day | CUTANEOUS | Status: DC
  Administered 2020-01-02 – 2020-01-04 (×3): 6 via TOPICAL

## 2020-01-02 MED ORDER — ACETAMINOPHEN 325 MG PO TABS
650.0000 mg | ORAL_TABLET | Freq: Four times a day (QID) | ORAL | Status: DC | PRN
  Administered 2020-01-02 – 2020-01-03 (×3): 650 mg via ORAL
  Filled 2020-01-02 (×3): qty 2

## 2020-01-02 MED ORDER — POTASSIUM CHLORIDE IN NACL 20-0.9 MEQ/L-% IV SOLN: INTRAVENOUS | Status: AC

## 2020-01-02 MED ORDER — POLYETHYLENE GLYCOL 3350 17 G PO PACK: 17.0000 g | PACK | Freq: Every day | ORAL | Status: DC | PRN

## 2020-01-02 MED ORDER — KETOROLAC TROMETHAMINE 15 MG/ML IJ SOLN
15.0000 mg | Freq: Once | INTRAMUSCULAR | Status: AC
  Administered 2020-01-02: 15 mg via INTRAVENOUS
  Filled 2020-01-02: qty 1

## 2020-01-02 NOTE — ED Notes (Signed)
ED TO INPATIENT HANDOFF REPORT  ED Nurse Name and Phone #:  [phone]  S Name/Age/Gender Ralph Stevens 69 y.o. male Room/Bed: APA08/APA08  Code Status   Code Status: Full Code  Home/SNF/Other Home Patient oriented to: self, place, time and situation Is this baseline? Yes   Triage Complete: Triage complete  Chief Complaint Sepsis secondary to UTI ([place]) [A41.9, N39.0]  Triage Note Burning and frequency with urination since Sunday     Allergies No Known Allergies  Level of Care/Admitting Diagnosis ED Disposition    ED Disposition Condition [place]: [facility] [id number]  Level of Care: Telemetry [5]  Covid Evaluation: Asymptomatic Screening Protocol (No Symptoms)  Diagnosis: [id number]  Admitting Physician: Reubin Milan [id number]  Attending Physician: Reubin Milan [id number]  Estimated length of stay: past midnight tomorrow  Certification:: I certify this patient will need inpatient services for at least 2 midnights       B Medical/Surgery History Past Medical History:  Diagnosis Date  . Acid reflux   . Arthritis   . Arthritis   . Enlarged prostate    PSA goes up and down   . GERD (gastroesophageal reflux disease)   . High cholesterol   . Hyperlipidemia   . Hypertension    Past Surgical History:  Procedure Laterality Date  . EYE SURGERY    . TONSILLECTOMY     age 47      A IV Location/Drains/Wounds Patient Lines/Drains/Airways Status   Active Line/Drains/Airways    Name:   Placement date:   Placement time:   Site:   Days:   Peripheral IV 01/01/20 Left Antecubital   01/01/20    1802    Antecubital   1   Urethral Catheter Angelena Hunt NT 1+3 Straight-tip 16 Fr.   01/01/20    2316    Straight-tip   1          Intake/Output Last 24 hours  Intake/Output Summary (Last 24 hours) at 01/02/2020 1510 Last data filed at 01/02/2020 1120 Gross per 24 hour  Intake 3184.14 ml  Output  675 ml  Net 2509.14 ml    Labs/Imaging Results for orders placed or performed during the hospital encounter of 01/01/20 (from the past 48 hour(s))  Urinalysis, Routine w reflex microscopic     Status: Abnormal   Collection Time: 01/01/20  5:54 PM  Result Value Ref Range   Color, Urine ORANGE (A) YELLOW    Comment: BIOCHEMICALS MAY BE AFFECTED BY COLOR   APPearance CLOUDY (A) CLEAR   Specific Gravity, Urine 1.020 1.005 - 1.030   pH 5.0 5.0 - 8.0   Glucose, UA 250 (A) NEGATIVE mg/dL   Hgb urine dipstick SMALL (A) NEGATIVE   Bilirubin Urine SMALL (A) NEGATIVE   Ketones, ur TRACE (A) NEGATIVE mg/dL   Protein, ur >300 (A) NEGATIVE mg/dL   Nitrite POSITIVE (A) NEGATIVE   Leukocytes,Ua LARGE (A) NEGATIVE    Comment: Performed at [facility], [address], [place], [place] [address]  Urinalysis, Microscopic (reflex)     Status: Abnormal   Collection Time: 01/01/20  5:54 PM  Result Value Ref Range   RBC / HPF 0-5 0 - 5 RBC/hpf   WBC, UA 21-50 0 - 5 WBC/hpf   Bacteria, UA MANY (A) NONE SEEN   Squamous Epithelial / LPF 0-5 0 - 5    Comment: Performed at [facility], [address] [place], [place] [address]  Lactic acid, plasma     Status: None   Collection Time: 01/01/20  6:04 PM  Result Value Ref Range   Lactic Acid, Venous 1.4 0.5 - 1.9 mmol/L    Comment: Performed at [facility], [address], [place], [place] [address]  Comprehensive metabolic panel     Status: Abnormal   Collection Time: 01/01/20  6:04 PM  Result Value Ref Range   Sodium 137 135 - 145 mmol/L   Potassium 2.8 (L) 3.5 - 5.1 mmol/L   Chloride 105 98 - 111 mmol/L   CO2 22 22 - 32 mmol/L   Glucose, Bld 140 (H) 70 - 99 mg/dL    Comment: Glucose reference range applies only to samples taken after fasting for at least 8 hours.   BUN 13 8 - 23 mg/dL   Creatinine, Ser 1.01 0.61 - 1.24 mg/dL   Calcium 8.3 (L) 8.9 - 10.3 mg/dL   Total Protein 7.1 6.5 - 8.1 g/dL   Albumin 3.1 (L) 3.5 - 5.0 g/dL   AST 14 (L) 15 - 41  U/L   ALT 16 0 - 44 U/L   Alkaline Phosphatase 77 38 - 126 U/L   Total Bilirubin 1.2 0.3 - 1.2 mg/dL   GFR calc non Af Amer >60 >60 mL/min   GFR calc Af Amer >60 >60 mL/min   Anion gap 10 5 - 15    Comment: Performed at [facility], [address], [place], [place] [address]  CBC WITH DIFFERENTIAL     Status: Abnormal   Collection Time: 01/01/20  6:04 PM  Result Value Ref Range   WBC 15.4 (H) 4.0 - 10.5 K/uL   RBC 4.53 4.22 - 5.81 MIL/uL   Hemoglobin 13.7 13.0 - 17.0 g/dL   HCT 40.5 39.0 - 52.0 %   MCV 89.4 80.0 - 100.0 fL   MCH 30.2 26.0 - 34.0 pg   MCHC 33.8 30.0 - 36.0 g/dL   RDW 13.0 11.5 - 15.5 %   Platelets 259 150 - 400 K/uL   nRBC 0.0 0.0 - 0.2 %   Neutrophils Relative % 88 %   Neutro Abs 13.4 (H) 1.7 - 7.7 K/uL   Lymphocytes Relative 6 %   Lymphs Abs 0.9 0.7 - 4.0 K/uL   Monocytes Relative 5 %   Monocytes Absolute 0.8 0.1 - 1.0 K/uL   Eosinophils Relative 0 %   Eosinophils Absolute 0.0 0.0 - 0.5 K/uL   Basophils Relative 0 %   Basophils Absolute 0.0 0.0 - 0.1 K/uL   Immature Granulocytes 1 %   Abs Immature Granulocytes 0.20 (H) 0.00 - 0.07 K/uL    Comment: Performed at [facility], [address] [place], [place] [address]  APTT     Status: Abnormal   Collection Time: 01/01/20  6:04 PM  Result Value Ref Range   aPTT 38 (H) 24 - 36 seconds    Comment:        IF BASELINE aPTT IS ELEVATED, SUGGEST PATIENT RISK ASSESSMENT BE USED TO DETERMINE APPROPRIATE ANTICOAGULANT THERAPY. Performed at [facility], [address] [place], [place] [address]   Protime-INR     Status: None   Collection Time: 01/01/20  6:04 PM  Result Value Ref Range   Prothrombin Time 13.6 11.4 - 15.2 seconds   INR 1.1 0.8 - 1.2    Comment: (NOTE) INR goal varies based on device and disease states. Performed at [facility], [address] [place], [place] [address]  Blood Culture (routine x 2)     Status: None (Preliminary result)   Collection Time: 01/01/20  6:04 PM   Specimen: Right  Antecubital; Blood  Result Value Ref Range   Specimen Description RIGHT ANTECUBITAL    Special Requests      BOTTLES DRAWN AEROBIC AND ANAEROBIC Blood Culture adequate volume Performed at [facility], [address] [place], [place] [address]    Culture PENDING    Report Status PENDING   Blood Culture (routine x 2)     Status: None (Preliminary result)   Collection Time: 01/01/20  6:07 PM   Specimen: Left Antecubital; Blood  Result Value Ref Range   Specimen Description LEFT ANTECUBITAL    Special Requests      BOTTLES DRAWN AEROBIC AND ANAEROBIC Blood Culture adequate volume Performed at [facility], [address], [place], [place] [address]    Culture PENDING    Report Status PENDING   Lactic acid, plasma     Status: None   Collection Time: 01/01/20  7:41 PM  Result Value Ref Range   Lactic Acid, Venous 1.1 0.5 - 1.9 mmol/L    Comment: Performed at [facility], [address], [place], [place] [address]  SARS Coronavirus 2 by RT PCR (hospital order, performed in [place] hospital lab) Nasopharyngeal Nasopharyngeal Swab     Status: None   Collection Time: 01/01/20 11:56 PM   Specimen: Nasopharyngeal Swab  Result Value Ref Range   SARS Coronavirus 2 NEGATIVE NEGATIVE    Comment: (NOTE) SARS-CoV-2 target nucleic acids are NOT DETECTED. The SARS-CoV-2 RNA is generally detectable in upper and lower respiratory specimens during the acute phase of infection. The lowest concentration of SARS-CoV-2 viral copies this assay can detect is 250 copies / mL. A negative result does not preclude SARS-CoV-2 infection and should not be used as the sole basis for treatment or other patient management decisions.  A negative result may occur with improper specimen collection / handling, submission of specimen other than nasopharyngeal swab, presence of viral mutation(s) within the areas targeted by this assay, and inadequate number of viral copies (<250 copies / mL). A negative result must  be combined with clinical observations, patient history, and epidemiological information. Fact Sheet for Patients:   StrictlyIdeas.no Fact Sheet for Healthcare Providers: BankingDealers.co.za This test is not yet approved or cleared  by the [place] FDA and has been authorized for detection and/or diagnosis of SARS-CoV-2 by FDA under an Emergency Use Authorization (EUA).  This EUA will remain in effect (meaning this test can be used) for the duration of the COVID-19 declaration under Section 564(b)(1) of the Act, 21 U.S.C. section 360bbb-3(b)(1), unless the authorization is terminated or revoked sooner. Performed at [facility], [address], [place], [place] [address]   CBC WITH DIFFERENTIAL     Status: Abnormal   Collection Time: 01/02/20  3:52 AM  Result Value Ref Range   WBC 9.6 4.0 - 10.5 K/uL   RBC 3.80 (L) 4.22 - 5.81 MIL/uL   Hemoglobin 11.6 (L) 13.0 - 17.0 g/dL   HCT 34.2 (L) 39.0 - 52.0 %   MCV 90.0 80.0 - 100.0 fL   MCH 30.5 26.0 - 34.0 pg   MCHC 33.9 30.0 - 36.0 g/dL   RDW 13.2 11.5 - 15.5 %   Platelets 210 150 - 400 K/uL   nRBC 0.0 0.0 - 0.2 %   Neutrophils Relative % 90 %   Neutro Abs 8.6 (H) 1.7 - 7.7  K/uL   Lymphocytes Relative 4 %   Lymphs Abs 0.4 (L) 0.7 - 4.0 K/uL   Monocytes Relative 5 %   Monocytes Absolute 0.5 0.1 - 1.0 K/uL   Eosinophils Relative 0 %   Eosinophils Absolute 0.0 0.0 - 0.5 K/uL   Basophils Relative 0 %   Basophils Absolute 0.0 0.0 - 0.1 K/uL   Immature Granulocytes 1 %   Abs Immature Granulocytes 0.11 (H) 0.00 - 0.07 K/uL    Comment: Performed at [facility], [address], [place], [place] [id number]  Basic metabolic panel     Status: Abnormal   Collection Time: 01/02/20  3:52 AM  Result Value Ref Range   Sodium 137 135 - 145 mmol/L   Potassium 3.0 (L) 3.5 - 5.1 mmol/L   Chloride 107 98 - 111 mmol/L   CO2 23 22 - 32 mmol/L   Glucose, Bld 116 (H) 70 - 99 mg/dL    Comment: Glucose  reference range applies only to samples taken after fasting for at least 8 hours.   BUN 12 8 - 23 mg/dL   Creatinine, Ser 1.07 0.61 - 1.24 mg/dL   Calcium 7.5 (L) 8.9 - 10.3 mg/dL   GFR calc non Af Amer >60 >60 mL/min   GFR calc Af Amer >60 >60 mL/min   Anion gap 7 5 - 15    Comment: Performed at [facility], [address], [place], [place] [address]   DG CHEST PORT 1 VIEW  Result Date: 01/01/2020 CLINICAL DATA:  Urinary tract infection, sepsis, fever, tachycardia EXAM: PORTABLE CHEST 1 VIEW COMPARISON:  None. FINDINGS: The heart size and mediastinal contours are within normal limits. Both lungs are clear. The visualized skeletal structures are unremarkable. IMPRESSION: No active disease. Electronically Signed   By: Randa Ngo M.D.   On: 01/01/2020 23:16    Pending Labs Unresulted Labs (From admission, onward)    Start     Ordered   01/09/20 0500  Creatinine, serum  (enoxaparin (LOVENOX)    CrCl >/= 30 ml/min)  Weekly,   R    Comments: while on enoxaparin therapy    01/02/20 0009   01/03/20 0500  HIV Antibody (routine testing w rflx)  (HIV Antibody (Routine testing w reflex) panel)  Tomorrow morning,   R     01/02/20 0009   01/03/20 [id number]  Basic metabolic panel  Daily,   R    Question:  Specimen collection method  Answer:  Lab=Lab collect   01/02/20 0833   01/03/20 0500  CBC  Daily,   R    Question:  Specimen collection method  Answer:  Lab=Lab collect   01/02/20 0833   01/03/20 0500  Magnesium  Daily,   R    Question:  Specimen collection method  Answer:  Lab=Lab collect   01/02/20 0833   01/02/20 0500  CBC WITH DIFFERENTIAL  Daily,   R     01/02/20 0009   01/02/20 [id number]  Basic metabolic panel  Daily,   R     01/02/20 0009   01/01/20 1754  Urine culture  ONCE - STAT,   STAT     01/01/20 1755          Vitals/Pain Today's Vitals   01/02/20 1200 01/02/20 1300 01/02/20 1400 01/02/20 1500  BP: 125/79 136/89 115/68 110/70  Pulse: 85 87 79 81  Resp: 18 (!) 21 18 16    Temp:      TempSrc:      SpO2:  96% 95% 97% 93%  Weight:      Height:      PainSc:        Isolation Precautions No active isolations  Medications Medications  cefTRIAXone (ROCEPHIN) 1 g in sodium chloride 0.9 % 100 mL IVPB (0 g Intravenous Stopped 01/01/20 1844)  amLODipine (NORVASC) tablet 5 mg (5 mg Oral Given 01/02/20 1122)  alfuzosin (UROXATRAL) 24 hr tablet 10 mg (10 mg Oral Given 01/02/20 1122)  aspirin chewable tablet 81 mg (81 mg Oral Given 01/02/20 0813)  atorvastatin (LIPITOR) tablet 10 mg (10 mg Oral Given 01/01/20 2350)  pantoprazole (PROTONIX) EC tablet 40 mg (40 mg Oral Given 01/02/20 1123)  fluticasone (FLONASE) 50 MCG/ACT nasal spray 1-2 spray (has no administration in time range)  metoprolol tartrate (LOPRESSOR) tablet 50 mg (50 mg Oral Given 01/02/20 1123)  lisinopril (ZESTRIL) tablet 40 mg (40 mg Oral Given 01/02/20 1122)  enoxaparin (LOVENOX) injection 40 mg (40 mg Subcutaneous Given 01/02/20 1144)  acetaminophen (TYLENOL) tablet 650 mg (650 mg Oral Given 01/02/20 0241)    Or  acetaminophen (TYLENOL) suppository 650 mg ( Rectal See Alternative 01/02/20 0241)  ondansetron (ZOFRAN) tablet 4 mg (has no administration in time range)    Or  ondansetron (ZOFRAN) injection 4 mg (has no administration in time range)  0.9 % NaCl with KCl 20 mEq/ L  infusion ( Intravenous Stopped 01/02/20 0457)  potassium chloride SA (KLOR-CON) CR tablet 40 mEq (40 mEq Oral Not Given 01/02/20 1146)  0.9 %  sodium chloride infusion ( Intravenous New Bag/Given 01/02/20 1128)  senna-docusate (Senokot-S) tablet 2 tablet (has no administration in time range)  polyethylene glycol (MIRALAX / GLYCOLAX) packet 17 g (has no administration in time range)  sodium chloride 0.9 % bolus 1,000 mL (0 mLs Intravenous Stopped 01/01/20 1905)  acetaminophen (TYLENOL) tablet 1,000 mg (1,000 mg Oral Given 01/01/20 1843)  potassium chloride SA (KLOR-CON) CR tablet 40 mEq (40 mEq Oral Given 01/01/20 1905)  potassium chloride  10 mEq in 100 mL IVPB (0 mEq Intravenous Stopped 01/01/20 2017)  sodium chloride 0.9 % bolus 1,000 mL (0 mLs Intravenous Stopped 01/01/20 2116)  metoprolol tartrate (LOPRESSOR) injection 5 mg (5 mg Intravenous Given 01/01/20 2349)  ketorolac (TORADOL) 15 MG/ML injection 15 mg (15 mg Intravenous Given 01/01/20 2349)  magnesium sulfate IVPB 2 g 50 mL (0 g Intravenous Stopped 01/02/20 0223)  ketorolac (TORADOL) 15 MG/ML injection 15 mg (15 mg Intravenous Given 01/02/20 0501)    Mobility walks Low fall risk   Focused Assessments    R Recommendations: See Admitting Provider Note  Report given to:   Additional Notes:

## 2020-01-02 NOTE — ED Notes (Signed)
Report to [employer], RN 300

## 2020-01-02 NOTE — Progress Notes (Signed)
PROGRESS NOTE    Kyung Woodman  [id number] DOB: 05-Sep-1950 DOA: 01/01/2020 PCP: [name], DO   Brief Narrative:  69 year old with history of GERD, OA, HLD, HTN presented to the hospital complains of urinary burning frequency ongoing for 3 days.  Upon admission he was noted to be septic, tachycardic febrile with soft blood pressure.  Had elevated WBC as well.  UA was dirty suggestive of urinary tract infection.  He was started on IV Rocephin.   Assessment & Plan:   Principal Problem:   Sepsis secondary to UTI [facility]) Active Problems:   Benign prostatic hyperplasia with urinary frequency   Essential hypertension   GERD (gastroesophageal reflux disease)   Hyperlipidemia   Hypokalemia   Sepsis secondary to urinary tract infection, present on admission Sepsis physiology is improving, lactic acid, WBC has trended down.  Cultures have been drawn.  Follow-up culture data Currently on empiric Rocephin 1 g IV every 24 hours Denies any abdominal pain nausea vomiting therefore we will hold off on abdominal imaging at this time  Hypokalemia Aggressive repletion has been ordered.  History of BPH Resume his home medications and follow-up outpatient neurology.  Essential hypertension Blood pressure is somewhat stable at this time therefore continue amlodipine and lisinopril. Hydrochlorothiazide on hold as he is getting hydrated.  GERD PPI  Hyperlipidemia Continue atorvastatin 10 mg daily.  DVT prophylaxis: Subcutaneous Lovenox Code Status: Full code Family Communication:    Status is: Inpatient  Remains inpatient appropriate because:IV treatments appropriate due to intensity of illness or inability to take PO   Dispo: The patient is from: Home              Anticipated d/c is to: Home              Anticipated d/c date is: 2 days              Patient currently is not medically stable to d/c.  Due to severe urinary tract infection secondary to sepsis currently patient  will be on IV Rocephin until further culture data available.   Subjective: Patient feels slightly better after receiving IV fluids and antibiotics overnight.  No other complaints at this time.  Review of Systems Otherwise negative except as per HPI, including: General: Denies fever, chills, night sweats or unintended weight loss. Resp: Denies cough, wheezing, shortness of breath. Cardiac: Denies chest pain, palpitations, orthopnea, paroxysmal nocturnal dyspnea. GI: Denies abdominal pain, nausea, vomiting, diarrhea or constipation GU: Denies dysuria, frequency, hesitancy or incontinence MS: Denies muscle aches, joint pain or swelling Neuro: Denies headache, neurologic deficits (focal weakness, numbness, tingling), abnormal gait Psych: Denies anxiety, depression, SI/HI/AVH Skin: Denies new rashes or lesions ID: Denies sick contacts, exotic exposures, travel  Examination:  General exam: Appears calm and comfortable, dry mouth. Respiratory system: Clear to auscultation. Respiratory effort normal. Cardiovascular system: S1 & S2 heard, RRR. No JVD, murmurs, rubs, gallops or clicks. No pedal edema. Gastrointestinal system: Abdomen is nondistended, soft and nontender. No organomegaly or masses felt. Normal bowel sounds heard. Central nervous system: Alert and oriented. No focal neurological deficits. Extremities: Symmetric 5 x 5 power. Skin: No rashes, lesions or ulcers Psychiatry: Judgement and insight appear normal. Mood & affect appropriate.     Objective: Vitals:   01/02/20 0500 01/02/20 0503 01/02/20 0730 01/02/20 1118  BP: 122/72  106/76 (!) 141/87  Pulse: (!) 104  88 95  Resp: 19  (!) 27 18  Temp:  (!) 101.9 F (38.8 C)  98.2 F (  36.8 C)  TempSrc:  Axillary  Oral  SpO2: 97%  96% 96%  Weight:      Height:        Intake/Output Summary (Last 24 hours) at 01/02/2020 1212 Last data filed at 01/02/2020 1120 Gross per 24 hour  Intake 3184.14 ml  Output 675 ml  Net 2509.14 ml    Filed Weights   01/01/20 1739  Weight: 86.2 kg     Data Reviewed:   CBC: Recent Labs  Lab 01/01/20 1804 01/02/20 0352  WBC 15.4* 9.6  NEUTROABS 13.4* 8.6*  HGB 13.7 11.6*  HCT 40.5 34.2*  MCV 89.4 90.0  PLT 259 [id number]   Basic Metabolic Panel: Recent Labs  Lab 01/01/20 1804 01/02/20 0352  NA 137 137  K 2.8* 3.0*  CL 105 107  CO2 22 23  GLUCOSE 140* 116*  BUN 13 12  CREATININE 1.01 1.07  CALCIUM 8.3* 7.5*   GFR: Estimated Creatinine Clearance: 72.5 mL/min (by C-G formula based on SCr of 1.07 mg/dL). Liver Function Tests: Recent Labs  Lab 01/01/20 1804  AST 14*  ALT 16  ALKPHOS 77  BILITOT 1.2  PROT 7.1  ALBUMIN 3.1*   No results for input(s): LIPASE, AMYLASE in the last 168 hours. No results for input(s): AMMONIA in the last 168 hours. Coagulation Profile: Recent Labs  Lab 01/01/20 1804  INR 1.1   Cardiac Enzymes: No results for input(s): CKTOTAL, CKMB, CKMBINDEX, TROPONINI in the last 168 hours. BNP (last 3 results) No results for input(s): PROBNP in the last 8760 hours. HbA1C: No results for input(s): HGBA1C in the last 72 hours. CBG: No results for input(s): GLUCAP in the last 168 hours. Lipid Profile: No results for input(s): CHOL, HDL, LDLCALC, TRIG, CHOLHDL, LDLDIRECT in the last 72 hours. Thyroid Function Tests: No results for input(s): TSH, T4TOTAL, FREET4, T3FREE, THYROIDAB in the last 72 hours. Anemia Panel: No results for input(s): VITAMINB12, FOLATE, FERRITIN, TIBC, IRON, RETICCTPCT in the last 72 hours. Sepsis Labs: Recent Labs  Lab 01/01/20 1804 01/01/20 1941  LATICACIDVEN 1.4 1.1    Recent Results (from the past 240 hour(s))  Blood Culture (routine x 2)     Status: None (Preliminary result)   Collection Time: 01/01/20  6:04 PM   Specimen: Right Antecubital; Blood  Result Value Ref Range Status   Specimen Description RIGHT ANTECUBITAL  Final   Special Requests   Final    BOTTLES DRAWN AEROBIC AND ANAEROBIC Blood Culture  adequate volume Performed at [facility], [address], [place], [place] [address]    Culture PENDING  Incomplete   Report Status PENDING  Incomplete  Blood Culture (routine x 2)     Status: None (Preliminary result)   Collection Time: 01/01/20  6:07 PM   Specimen: Left Antecubital; Blood  Result Value Ref Range Status   Specimen Description LEFT ANTECUBITAL  Final   Special Requests   Final    BOTTLES DRAWN AEROBIC AND ANAEROBIC Blood Culture adequate volume Performed at [facility], [address] [place], [place] [address]    Culture PENDING  Incomplete   Report Status PENDING  Incomplete  SARS Coronavirus 2 by RT PCR (hospital order, performed in [place] hospital lab) Nasopharyngeal Nasopharyngeal Swab     Status: None   Collection Time: 01/01/20 11:56 PM   Specimen: Nasopharyngeal Swab  Result Value Ref Range Status   SARS Coronavirus 2 NEGATIVE NEGATIVE Final    Comment: (NOTE) SARS-CoV-2 target nucleic acids are NOT DETECTED. The  SARS-CoV-2 RNA is generally detectable in upper and lower respiratory specimens during the acute phase of infection. The lowest concentration of SARS-CoV-2 viral copies this assay can detect is 250 copies / mL. A negative result does not preclude SARS-CoV-2 infection and should not be used as the sole basis for treatment or other patient management decisions.  A negative result may occur with improper specimen collection / handling, submission of specimen other than nasopharyngeal swab, presence of viral mutation(s) within the areas targeted by this assay, and inadequate number of viral copies (<250 copies / mL). A negative result must be combined with clinical observations, patient history, and epidemiological information. Fact Sheet for Patients:   StrictlyIdeas.no Fact Sheet for Healthcare Providers: BankingDealers.co.za This test is not yet approved or cleared  by the [place] FDA  and has been authorized for detection and/or diagnosis of SARS-CoV-2 by FDA under an Emergency Use Authorization (EUA).  This EUA will remain in effect (meaning this test can be used) for the duration of the COVID-19 declaration under Section 564(b)(1) of the Act, 21 U.S.C. section 360bbb-3(b)(1), unless the authorization is terminated or revoked sooner. Performed at [facility], [address] [place], [place] [address]          Radiology Studies: DG CHEST PORT 1 VIEW  Result Date: 01/01/2020 CLINICAL DATA:  Urinary tract infection, sepsis, fever, tachycardia EXAM: PORTABLE CHEST 1 VIEW COMPARISON:  None. FINDINGS: The heart size and mediastinal contours are within normal limits. Both lungs are clear. The visualized skeletal structures are unremarkable. IMPRESSION: No active disease. Electronically Signed   By: Randa Ngo M.D.   On: 01/01/2020 23:16        Scheduled Meds: . alfuzosin  10 mg Oral Q breakfast  . amLODipine  5 mg Oral Daily  . aspirin  81 mg Oral q AM  . atorvastatin  10 mg Oral QHS  . enoxaparin (LOVENOX) injection  40 mg Subcutaneous Q24H  . lisinopril  40 mg Oral Daily  . metoprolol tartrate  50 mg Oral BID  . pantoprazole  40 mg Oral Daily  . potassium chloride  40 mEq Oral Q4H   Continuous Infusions: . sodium chloride 100 mL/hr at 01/02/20 1128  . cefTRIAXone (ROCEPHIN)  IV Stopped (01/01/20 1844)     LOS: 1 day   Time spent= 35 mins    Mylisa Brunson Arsenio Loader, MD [facility]  If 7PM-7AM, please contact night-coverage  01/02/2020, 12:12 PM

## 2020-01-03 LAB — CBC WITH DIFFERENTIAL/PLATELET
Abs Immature Granulocytes: 0.1 10*3/uL — ABNORMAL HIGH (ref 0.00–0.07)
Basophils Absolute: 0.1 10*3/uL (ref 0.0–0.1)
Basophils Relative: 1 %
Eosinophils Absolute: 0.2 10*3/uL (ref 0.0–0.5)
Eosinophils Relative: 2 %
HCT: 36.5 % — ABNORMAL LOW (ref 39.0–52.0)
Hemoglobin: 12 g/dL — ABNORMAL LOW (ref 13.0–17.0)
Immature Granulocytes: 1 %
Lymphocytes Relative: 14 %
Lymphs Abs: 1.2 10*3/uL (ref 0.7–4.0)
MCH: 29.9 pg (ref 26.0–34.0)
MCHC: 32.9 g/dL (ref 30.0–36.0)
MCV: 91 fL (ref 80.0–100.0)
Monocytes Absolute: 0.8 10*3/uL (ref 0.1–1.0)
Monocytes Relative: 9 %
Neutro Abs: 6.5 10*3/uL (ref 1.7–7.7)
Neutrophils Relative %: 73 %
Platelets: 251 10*3/uL (ref 150–400)
RBC: 4.01 MIL/uL — ABNORMAL LOW (ref 4.22–5.81)
RDW: 13.3 % (ref 11.5–15.5)
WBC: 8.9 10*3/uL (ref 4.0–10.5)
nRBC: 0 % (ref 0.0–0.2)

## 2020-01-03 LAB — BASIC METABOLIC PANEL
Anion gap: 8 (ref 5–15)
BUN: 12 mg/dL (ref 8–23)
CO2: 22 mmol/L (ref 22–32)
Calcium: 7.8 mg/dL — ABNORMAL LOW (ref 8.9–10.3)
Chloride: 110 mmol/L (ref 98–111)
Creatinine, Ser: 0.82 mg/dL (ref 0.61–1.24)
GFR calc Af Amer: >60 mL/min (ref >60)
GFR calc non Af Amer: >60 mL/min (ref >60)
Glucose, Bld: 101 mg/dL — ABNORMAL HIGH (ref 70–99)
Potassium: 3.2 mmol/L — ABNORMAL LOW (ref 3.5–5.1)
Sodium: 140 mmol/L (ref 135–145)

## 2020-01-03 LAB — MAGNESIUM: Magnesium: 2 mg/dL (ref 1.7–2.4)

## 2020-01-03 LAB — HIV ANTIBODY (ROUTINE TESTING W REFLEX): HIV Screen 4th Generation wRfx: REACTIVE — AB

## 2020-01-03 MED ORDER — PHENAZOPYRIDINE HCL 100 MG PO TABS
100.0000 mg | ORAL_TABLET | Freq: Three times a day (TID) | ORAL | Status: DC
  Administered 2020-01-03 – 2020-01-04 (×2): 100 mg via ORAL
  Filled 2020-01-03 (×2): qty 1

## 2020-01-03 MED ORDER — SODIUM CHLORIDE 0.9 % IV SOLN: INTRAVENOUS | Status: DC

## 2020-01-03 MED ORDER — POTASSIUM CHLORIDE CRYS ER 20 MEQ PO TBCR
40.0000 meq | EXTENDED_RELEASE_TABLET | ORAL | Status: AC
  Administered 2020-01-03 (×2): 40 meq via ORAL
  Filled 2020-01-03 (×2): qty 2

## 2020-01-03 MED ORDER — TRAZODONE HCL 50 MG PO TABS
50.0000 mg | ORAL_TABLET | Freq: Every evening | ORAL | Status: DC | PRN
  Administered 2020-01-03: 50 mg via ORAL
  Filled 2020-01-03: qty 1

## 2020-01-03 NOTE — Progress Notes (Signed)
Pt has been unable to urinate for past hour. Bladder scan performed with > 571ml noted with two scans. #14 fr foley cath inserted by NT with immediate return of 543ml of clear yellow urine. Pt stated immediate relief of pain and pressure with cath insertion.

## 2020-01-03 NOTE — Progress Notes (Signed)
Pt has voided total of 200 ml of urine since foley cath removed, but pt is only voided 50-31ml each time. Pt c/o severe burning, pressure and frequency. Bladder scan performed, approx 130ml urine noted in bladder. Pt with no c/o abd pain with palpation of lower abd, no distention noted. MD Reesa Chew notified of pt's c/o, awaiting orders.

## 2020-01-03 NOTE — Care Management Important Message (Signed)
Important Message  Patient Details  Name: Ralph Stevens MRN: [id number] Date of Birth: 1951-02-06   Medicare Important Message Given:  Yes     Tommy Medal 01/03/2020, 3:39 PM

## 2020-01-03 NOTE — Progress Notes (Addendum)
PROGRESS NOTE    Ralph Stevens  [id number] DOB: 1951-06-17 DOA: 01/01/2020 PCP: [name], DO   Brief Narrative:  69 year old with history of GERD, OA, HLD, HTN presented to the hospital complains of urinary burning frequency ongoing for 3 days.  Upon admission he was noted to be septic, tachycardic febrile with soft blood pressure.  Had elevated WBC as well.  UA was dirty suggestive of urinary tract infection.  He was started on IV Rocephin.   Assessment & Plan:   Principal Problem:   Sepsis secondary to UTI [facility]) Active Problems:   Benign prostatic hyperplasia with urinary frequency   Essential hypertension   GERD (gastroesophageal reflux disease)   Hyperlipidemia   Hypokalemia   Sepsis secondary to urinary tract infection, present on admission Sepsis physiology improving.  Culture data is growing gram-negative rod.  Susceptibilities pending. 1 more day of IV Rocephin, once via further culture data we can transition to p.o. Denies any abdominal pain nausea vomiting therefore we will hold off on abdominal imaging at this time Remove Foley catheter today.  Voiding trial later on today. Continue IV fluids.  Hypokalemia Repletion ordered  History of BPH Resume his home medications and follow-up outpatient urology.  Essential hypertension Blood pressure is somewhat stable at this time therefore continue amlodipine and lisinopril. Hydrochlorothiazide on hold as he is getting hydrated.  GERD PPI  Hyperlipidemia Continue atorvastatin 10 mg daily.  Screening HIV resulted reactive therefore confirmatory test has been ordered reflexively by infectious disease.  I have informed the patient regarding the findings.  DVT prophylaxis: Subcutaneous Lovenox Code Status: Full code Family Communication: Wife present at bedside  Status is: Inpatient  Remains inpatient appropriate because:IV treatments appropriate due to intensity of illness or inability to take  PO   Dispo: The patient is from: Home              Anticipated d/c is to: Home              Anticipated d/c date is: 1 day              Patient currently is not medically stable to d/c.  Due to the severity of sepsis he still needs 1 more day of IV antibiotic in the meantime we are awaiting culture data and sensitivities.  Hopefully I can discharge him tomorrow on p.o. antibiotics.   Subjective: Overall he feels slightly better compared to yesterday.  Still has a Foley catheter in place.  Review of Systems Otherwise negative except as per HPI, including: General: Denies fever, chills, night sweats or unintended weight loss. Resp: Denies cough, wheezing, shortness of breath. Cardiac: Denies chest pain, palpitations, orthopnea, paroxysmal nocturnal dyspnea. GI: Denies abdominal pain, nausea, vomiting, diarrhea or constipation GU: Denies dysuria, frequency, hesitancy or incontinence MS: Denies muscle aches, joint pain or swelling Neuro: Denies headache, neurologic deficits (focal weakness, numbness, tingling), abnormal gait Psych: Denies anxiety, depression, SI/HI/AVH Skin: Denies new rashes or lesions ID: Denies sick contacts, exotic exposures, travel  Examination: Constitutional: Not in acute distress Respiratory: Clear to auscultation bilaterally Cardiovascular: Normal sinus rhythm, no rubs Abdomen: Nontender nondistended good bowel sounds Musculoskeletal: No edema noted Skin: No rashes seen Neurologic: CN 2-12 grossly intact.  And nonfocal Psychiatric: Normal judgment and insight. Alert and oriented x 3. Normal mood.  Foley catheter in place with dark yellow urine. Objective: Vitals:   01/02/20 1831 01/02/20 2141 01/03/20 0209 01/03/20 0614  BP: (!) 141/78 (!) 146/83 (!) 150/85 138/79  Pulse: 90  99 95 87  Resp: 20 15 16 16   Temp: 98.8 F (37.1 C) 98.7 F (37.1 C) 100 F (37.8 C) 98.7 F (37.1 C)  TempSrc: Oral Oral Oral Oral  SpO2: 96% 98% 96% 95%  Weight:       Height: 6' (1.829 m)       Intake/Output Summary (Last 24 hours) at 01/03/2020 1100 Last data filed at 01/03/2020 0900 Gross per 24 hour  Intake 2313.4 ml  Output 2500 ml  Net -186.6 ml   Filed Weights   01/01/20 1739  Weight: 86.2 kg     Data Reviewed:   CBC: Recent Labs  Lab 01/01/20 1804 01/02/20 0352 01/03/20 0421  WBC 15.4* 9.6 8.9  NEUTROABS 13.4* 8.6* 6.5  HGB 13.7 11.6* 12.0*  HCT 40.5 34.2* 36.5*  MCV 89.4 90.0 91.0  PLT 259 210 [id number]   Basic Metabolic Panel: Recent Labs  Lab 01/01/20 1804 01/02/20 0352 01/03/20 0421  NA 137 137 140  K 2.8* 3.0* 3.2*  CL 105 107 110  CO2 22 23 22   GLUCOSE 140* 116* 101*  BUN 13 12 12   CREATININE 1.01 1.07 0.82  CALCIUM 8.3* 7.5* 7.8*  MG  --   --  2.0   GFR: Estimated Creatinine Clearance: 94.6 mL/min (by C-G formula based on SCr of 0.82 mg/dL). Liver Function Tests: Recent Labs  Lab 01/01/20 1804  AST 14*  ALT 16  ALKPHOS 77  BILITOT 1.2  PROT 7.1  ALBUMIN 3.1*   No results for input(s): LIPASE, AMYLASE in the last 168 hours. No results for input(s): AMMONIA in the last 168 hours. Coagulation Profile: Recent Labs  Lab 01/01/20 1804  INR 1.1   Cardiac Enzymes: No results for input(s): CKTOTAL, CKMB, CKMBINDEX, TROPONINI in the last 168 hours. BNP (last 3 results) No results for input(s): PROBNP in the last 8760 hours. HbA1C: No results for input(s): HGBA1C in the last 72 hours. CBG: No results for input(s): GLUCAP in the last 168 hours. Lipid Profile: No results for input(s): CHOL, HDL, LDLCALC, TRIG, CHOLHDL, LDLDIRECT in the last 72 hours. Thyroid Function Tests: No results for input(s): TSH, T4TOTAL, FREET4, T3FREE, THYROIDAB in the last 72 hours. Anemia Panel: No results for input(s): VITAMINB12, FOLATE, FERRITIN, TIBC, IRON, RETICCTPCT in the last 72 hours. Sepsis Labs: Recent Labs  Lab 01/01/20 1804 01/01/20 1941  LATICACIDVEN 1.4 1.1    Recent Results (from the past 240 hour(s))   Urine culture     Status: Abnormal (Preliminary result)   Collection Time: 01/01/20  5:54 PM   Specimen: In/Out Cath Urine  Result Value Ref Range Status   Specimen Description   Final    IN/OUT CATH URINE Performed at [facility], [address] [place], [place] [address]    Special Requests   Final    NONE Performed at [facility], [address], [place], [place] [address]    Culture (A)  Final    >=100,000 COLONIES/mL GRAM NEGATIVE RODS SUSCEPTIBILITIES TO FOLLOW Performed at [facility] [address], [place],  [address]    Report Status PENDING  Incomplete  Blood Culture (routine x 2)     Status: None (Preliminary result)   Collection Time: 01/01/20  6:04 PM   Specimen: Right Antecubital; Blood  Result Value Ref Range Status   Specimen Description RIGHT ANTECUBITAL  Final   Special Requests   Final    BOTTLES DRAWN AEROBIC AND ANAEROBIC Blood Culture adequate volume   Culture  Final    NO GROWTH 2 DAYS Performed at [facility], [address] [place], [place] [address]    Report Status PENDING  Incomplete  Blood Culture (routine x 2)     Status: None (Preliminary result)   Collection Time: 01/01/20  6:07 PM   Specimen: Left Antecubital; Blood  Result Value Ref Range Status   Specimen Description LEFT ANTECUBITAL  Final   Special Requests   Final    BOTTLES DRAWN AEROBIC AND ANAEROBIC Blood Culture adequate volume   Culture   Final    NO GROWTH 2 DAYS Performed at [facility], [address], [place], [place] [address]    Report Status PENDING  Incomplete  SARS Coronavirus 2 by RT PCR (hospital order, performed in [place] hospital lab) Nasopharyngeal Nasopharyngeal Swab     Status: None   Collection Time: 01/01/20 11:56 PM   Specimen: Nasopharyngeal Swab  Result Value Ref Range Status   SARS Coronavirus 2 NEGATIVE NEGATIVE Final    Comment: (NOTE) SARS-CoV-2 target nucleic acids are NOT DETECTED. The SARS-CoV-2 RNA is generally detectable  in upper and lower respiratory specimens during the acute phase of infection. The lowest concentration of SARS-CoV-2 viral copies this assay can detect is 250 copies / mL. A negative result does not preclude SARS-CoV-2 infection and should not be used as the sole basis for treatment or other patient management decisions.  A negative result may occur with improper specimen collection / handling, submission of specimen other than nasopharyngeal swab, presence of viral mutation(s) within the areas targeted by this assay, and inadequate number of viral copies (<250 copies / mL). A negative result must be combined with clinical observations, patient history, and epidemiological information. Fact Sheet for Patients:   StrictlyIdeas.no Fact Sheet for Healthcare Providers: BankingDealers.co.za This test is not yet approved or cleared  by the [place] FDA and has been authorized for detection and/or diagnosis of SARS-CoV-2 by FDA under an Emergency Use Authorization (EUA).  This EUA will remain in effect (meaning this test can be used) for the duration of the COVID-19 declaration under Section 564(b)(1) of the Act, 21 U.S.C. section 360bbb-3(b)(1), unless the authorization is terminated or revoked sooner. Performed at [facility], [address], [place], [place] [address]          Radiology Studies: DG CHEST PORT 1 VIEW  Result Date: 01/01/2020 CLINICAL DATA:  Urinary tract infection, sepsis, fever, tachycardia EXAM: PORTABLE CHEST 1 VIEW COMPARISON:  None. FINDINGS: The heart size and mediastinal contours are within normal limits. Both lungs are clear. The visualized skeletal structures are unremarkable. IMPRESSION: No active disease. Electronically Signed   By: Randa Ngo M.D.   On: 01/01/2020 23:16        Scheduled Meds: . alfuzosin  10 mg Oral Q supper  . amLODipine  5 mg Oral Daily  . aspirin  81 mg Oral q AM  .  atorvastatin  10 mg Oral QHS  . Chlorhexidine Gluconate Cloth  6 each Topical Daily  . enoxaparin (LOVENOX) injection  40 mg Subcutaneous Q24H  . lisinopril  40 mg Oral Daily  . metoprolol tartrate  50 mg Oral BID  . pantoprazole  40 mg Oral Daily  . potassium chloride  40 mEq Oral Q4H   Continuous Infusions: . sodium chloride    . cefTRIAXone (ROCEPHIN)  IV 1 g (01/02/20 2039)     LOS: 2 days   Time spent= 35 mins    Ralph Karbowski Chirag Tashima Scarpulla,  MD [facility]  If 7PM-7AM, please contact night-coverage  01/03/2020, 11:00 AM

## 2020-01-04 LAB — CBC WITH DIFFERENTIAL/PLATELET
Abs Immature Granulocytes: 0.17 10*3/uL — ABNORMAL HIGH (ref 0.00–0.07)
Basophils Absolute: 0.1 10*3/uL (ref 0.0–0.1)
Basophils Relative: 1 %
Eosinophils Absolute: 0.2 10*3/uL (ref 0.0–0.5)
Eosinophils Relative: 2 %
HCT: 38.2 % — ABNORMAL LOW (ref 39.0–52.0)
Hemoglobin: 12.7 g/dL — ABNORMAL LOW (ref 13.0–17.0)
Immature Granulocytes: 2 %
Lymphocytes Relative: 20 %
Lymphs Abs: 1.5 10*3/uL (ref 0.7–4.0)
MCH: 30 pg (ref 26.0–34.0)
MCHC: 33.2 g/dL (ref 30.0–36.0)
MCV: 90.3 fL (ref 80.0–100.0)
Monocytes Absolute: 0.7 10*3/uL (ref 0.1–1.0)
Monocytes Relative: 9 %
Neutro Abs: 5.1 10*3/uL (ref 1.7–7.7)
Neutrophils Relative %: 66 %
Platelets: 266 10*3/uL (ref 150–400)
RBC: 4.23 MIL/uL (ref 4.22–5.81)
RDW: 13.4 % (ref 11.5–15.5)
WBC: 7.7 10*3/uL (ref 4.0–10.5)
nRBC: 0 % (ref 0.0–0.2)

## 2020-01-04 LAB — BASIC METABOLIC PANEL
Anion gap: 8 (ref 5–15)
BUN: 10 mg/dL (ref 8–23)
CO2: 23 mmol/L (ref 22–32)
Calcium: 8.3 mg/dL — ABNORMAL LOW (ref 8.9–10.3)
Chloride: 110 mmol/L (ref 98–111)
Creatinine, Ser: 0.81 mg/dL (ref 0.61–1.24)
GFR calc Af Amer: >60 mL/min (ref >60)
GFR calc non Af Amer: >60 mL/min (ref >60)
Glucose, Bld: 98 mg/dL (ref 70–99)
Potassium: 3.4 mmol/L — ABNORMAL LOW (ref 3.5–5.1)
Sodium: 141 mmol/L (ref 135–145)

## 2020-01-04 LAB — URINE CULTURE: Culture: >=100000 — AB

## 2020-01-04 LAB — MAGNESIUM: Magnesium: 2.1 mg/dL (ref 1.7–2.4)

## 2020-01-04 MED ORDER — POTASSIUM CHLORIDE CRYS ER 20 MEQ PO TBCR
40.0000 meq | EXTENDED_RELEASE_TABLET | Freq: Once | ORAL | Status: AC
  Administered 2020-01-04: 40 meq via ORAL
  Filled 2020-01-04: qty 2

## 2020-01-04 MED ORDER — CEPHALEXIN 500 MG PO CAPS: 500.0000 mg | ORAL_CAPSULE | Freq: Four times a day (QID) | ORAL | 0 refills | Status: AC

## 2020-01-04 NOTE — Discharge Summary (Addendum)
Physician Discharge Summary  Ralph Stevens [id number] DOB: [birth year] DOA: 01/01/2020  PCP: [name], DO  Admit date: 01/01/2020 Discharge date: 01/04/2020  Admitted From: Home Disposition: Home  Recommendations for Outpatient Follow-up:  1. Follow up with PCP in 1-2 weeks. Inbox message sent to Dr Lajuana Ripple regarding his follow-up care 2. Please obtain BMP/CBC in one week your next doctors visit.  3. Confirmatory HIV results pending at the time of discharge 4. Keflex orally for 8 more days 5. Maintain Foley for 1 week, follow-up outpatient neurology in 1 week for post voiding trial.   Discharge Condition: Stable CODE STATUS: Full Diet recommendation: Heart healthy, 2 g salt.  Brief/Interim Summary: 69 year old with history of GERD, OA, HLD, HTN presented to the hospital complains of urinary burning frequency ongoing for 3 days.  Upon admission he was noted to be septic, tachycardic febrile with soft blood pressure.  Had elevated WBC as well.  UA was dirty suggestive of urinary tract infection.  He was started on IV Rocephin.  Eventually grew E. coli which was sensitive to Rocephin therefore transition to Keflex at the time of discharge.  Due to urinary retention Foley had to be placed and advised to follow-up outpatient urology. Screening HIV test came back reactive therefore confirmatory test was ordered and patient was advised to follow-up outpatient.  I have messaged his outpatient provider inbox to ensure follow-up care.   Assessment & Plan:   Principal Problem:   Sepsis secondary to UTI [facility]) Active Problems:   Benign prostatic hyperplasia with urinary frequency   Essential hypertension   GERD (gastroesophageal reflux disease)   Hyperlipidemia   Hypokalemia   Sepsis secondary to urinary tract infection, present on admission E. coli Acute urinary retention Sepsis physiology has resolved, WBC has trended down.  Patient remains afebrile.  Urine culture  growing E. coli sensitive to Rocephin.  Will transition IV Rocephin to oral Keflex. Foley catheter has to be placed due to retention, follow-up outpatient urology in 1 week for voiding trial. Foley placed 5/14  Hypokalemia Repletion ordered  History of BPH Resume his home medications and follow-up with his outpatient urology.  Essential hypertension Blood pressure is somewhat stable at this time therefore continue amlodipine and lisinopril. Hydrochlorothiazide on hold as he is getting hydrated.  GERD PPI  Hyperlipidemia Continue atorvastatin 10 mg daily.  Screening HIV test resulted positive, confirmatory test has been ordered.  I have discussed this with the patient and also sent message to his outpatient provider regarding necessary follow-up care as needed for this.  Confirmatory test pending at the time of discharge.   Discharge Diagnoses:  Principal Problem:   Sepsis secondary to UTI [facility]) Active Problems:   Benign prostatic hyperplasia with urinary frequency   Essential hypertension   GERD (gastroesophageal reflux disease)   Hyperlipidemia   Hypokalemia    Consultations:  None  Subjective: Feels great no complaints.  Wants to go home.  Discharge Exam: Vitals:   01/03/20 2111 01/04/20 0429  BP: (!) 149/87 (!) 163/96  Pulse: (!) 101 94  Resp: 15 18  Temp: 99.1 F (37.3 C) 98.9 F (37.2 C)  SpO2: 95% 94%   Vitals:   01/03/20 1500 01/03/20 1519 01/03/20 2111 01/04/20 0429  BP: (!) 149/90  (!) 149/87 (!) 163/96  Pulse: 88  (!) 101 94  Resp: 18  15 18   Temp: 98.6 F (37 C)  99.1 F (37.3 C) 98.9 F (37.2 C)  TempSrc: Oral  Oral Oral  SpO2: 96%  97% 95% 94%  Weight:      Height:        General: Pt is alert, awake, not in acute distress Cardiovascular: RRR, S1/S2 +, no rubs, no gallops Respiratory: CTA bilaterally, no wheezing, no rhonchi Abdominal: Soft, NT, ND, bowel sounds + Extremities: no edema, no cyanosis  Discharge  Instructions  Discharge Instructions    Discharge patient   Complete by: As directed    With foley care instructions   Discharge disposition: 01-Home or Self Care   Discharge patient date: 01/04/2020     Allergies as of 01/04/2020   No Known Allergies     Medication List    TAKE these medications   alfuzosin 10 MG 24 hr tablet Commonly known as: UROXATRAL Take 1 tablet (10 mg total) by mouth daily with breakfast.   amLODipine 5 MG tablet Commonly known as: NORVASC Take 1 tablet (5 mg total) by mouth daily.   aspirin 81 MG chewable tablet Chew 81 mg by mouth in the morning.   atorvastatin 10 MG tablet Commonly known as: LIPITOR Take 10 mg by mouth at bedtime.   cephALEXin 500 MG capsule Commonly known as: KEFLEX Take 1 capsule (500 mg total) by mouth 4 (four) times daily for 8 days.   esomeprazole 20 MG capsule Commonly known as: NEXIUM Take 20 mg by mouth in the morning.   fluticasone 50 MCG/ACT nasal spray Commonly known as: FLONASE Place 1-2 sprays into both nostrils daily as needed for allergies or rhinitis.   hydrochlorothiazide 12.5 MG capsule Commonly known as: MICROZIDE Take 12.5 mg by mouth daily.   lisinopril 40 MG tablet Commonly known as: ZESTRIL Take 1 tablet (40 mg total) by mouth daily.   metoprolol tartrate 50 MG tablet Commonly known as: LOPRESSOR Take 1 tablet (50 mg total) by mouth 2 (two) times daily.      Follow-up Information    Ronnie Doss M, DO Follow up in 1 week(s).   Specialty: Family Medicine Contact information: [place] [address] [phone]        Schedule an appointment as soon as possible for a visit  with [place].   Specialty: Emergency Medicine Contact information: [address] [id number] Prudy Feeler [place] [id number] [phone]       Franchot Gallo, MD. Schedule an appointment as soon as possible for a visit in 1 week(s).   Specialty:  Urology Contact information: [place] [address] [phone]          No Known Allergies  You were cared for by a hospitalist during your hospital stay. If you have any questions about your discharge medications or the care you received while you were in the hospital after you are discharged, you can call the unit and asked to speak with the hospitalist on call if the hospitalist that took care of you is not available. Once you are discharged, your primary care physician will handle any further medical issues. Please note that no refills for any discharge medications will be authorized once you are discharged, as it is imperative that you return to your primary care physician (or establish a relationship with a primary care physician if you do not have one) for your aftercare needs so that they can reassess your need for medications and monitor your lab values.   Procedures/Studies: DG CHEST PORT 1 VIEW  Result Date: 01/01/2020 CLINICAL DATA:  Urinary tract infection, sepsis, fever, tachycardia EXAM: PORTABLE CHEST  1 VIEW COMPARISON:  None. FINDINGS: The heart size and mediastinal contours are within normal limits. Both lungs are clear. The visualized skeletal structures are unremarkable. IMPRESSION: No active disease. Electronically Signed   By: Randa Ngo M.D.   On: 01/01/2020 23:16      The results of significant diagnostics from this hospitalization (including imaging, microbiology, ancillary and laboratory) are listed below for reference.     Microbiology: Recent Results (from the past 240 hour(s))  Urine culture     Status: Abnormal (Preliminary result)   Collection Time: 01/01/20  5:54 PM   Specimen: In/Out Cath Urine  Result Value Ref Range Status   Specimen Description   Final    IN/OUT CATH URINE Performed at [facility], [address] [place], [place] [address]    Special Requests   Final    NONE Performed at [facility], [address]  [place], [place] [address]    Culture (A)  Final    >=100,000 COLONIES/mL ESCHERICHIA COLI SUSCEPTIBILITIES TO FOLLOW Performed at [place] [address] [place], [place] [address]    Report Status PENDING  Incomplete  Blood Culture (routine x 2)     Status: None (Preliminary result)   Collection Time: 01/01/20  6:04 PM   Specimen: Right Antecubital; Blood  Result Value Ref Range Status   Specimen Description RIGHT ANTECUBITAL  Final   Special Requests   Final    BOTTLES DRAWN AEROBIC AND ANAEROBIC Blood Culture adequate volume   Culture   Final    NO GROWTH 2 DAYS Performed at [facility], [address] [place], [place] [address]    Report Status PENDING  Incomplete  Blood Culture (routine x 2)     Status: None (Preliminary result)   Collection Time: 01/01/20  6:07 PM   Specimen: Left Antecubital; Blood  Result Value Ref Range Status   Specimen Description LEFT ANTECUBITAL  Final   Special Requests   Final    BOTTLES DRAWN AEROBIC AND ANAEROBIC Blood Culture adequate volume   Culture   Final    NO GROWTH 2 DAYS Performed at [facility], [address], [place],  [address]    Report Status PENDING  Incomplete  SARS Coronavirus 2 by RT PCR (hospital order, performed in [place] hospital lab) Nasopharyngeal Nasopharyngeal Swab     Status: None   Collection Time: 01/01/20 11:56 PM   Specimen: Nasopharyngeal Swab  Result Value Ref Range Status   SARS Coronavirus 2 NEGATIVE NEGATIVE Final    Comment: (NOTE) SARS-CoV-2 target nucleic acids are NOT DETECTED. The SARS-CoV-2 RNA is generally detectable in upper and lower respiratory specimens during the acute phase of infection. The lowest concentration of SARS-CoV-2 viral copies this assay can detect is 250 copies / mL. A negative result does not preclude SARS-CoV-2 infection and should not be used as the sole basis for treatment or other patient management decisions.  A negative result may occur with improper  specimen collection / handling, submission of specimen other than nasopharyngeal swab, presence of viral mutation(s) within the areas targeted by this assay, and inadequate number of viral copies (<250 copies / mL). A negative result must be combined with clinical observations, patient history, and epidemiological information. Fact Sheet for Patients:   StrictlyIdeas.no Fact Sheet for Healthcare Providers: BankingDealers.co.za This test is not yet approved or cleared  by the [place] FDA and has been authorized for detection and/or diagnosis of SARS-CoV-2 by FDA under an Emergency  Use Authorization (EUA).  This EUA will remain in effect (meaning this test can be used) for the duration of the COVID-19 declaration under Section 564(b)(1) of the Act, 21 U.S.C. section 360bbb-3(b)(1), unless the authorization is terminated or revoked sooner. Performed at [facility], [address] [place], [place] [address]      Labs: BNP (last 3 results) No results for input(s): BNP in the last 8760 hours. Basic Metabolic Panel: Recent Labs  Lab 01/01/20 1804 01/02/20 0352 01/03/20 0421 01/04/20 0723  NA 137 137 140 141  K 2.8* 3.0* 3.2* 3.4*  CL 105 107 110 110  CO2 22 23 22 23   GLUCOSE 140* 116* 101* 98  BUN 13 12 12 10   CREATININE 1.01 1.07 0.82 0.81  CALCIUM 8.3* 7.5* 7.8* 8.3*  MG  --   --  2.0 2.1   Liver Function Tests: Recent Labs  Lab 01/01/20 1804  AST 14*  ALT 16  ALKPHOS 77  BILITOT 1.2  PROT 7.1  ALBUMIN 3.1*   No results for input(s): LIPASE, AMYLASE in the last 168 hours. No results for input(s): AMMONIA in the last 168 hours. CBC: Recent Labs  Lab 01/01/20 1804 01/02/20 0352 01/03/20 0421 01/04/20 0723  WBC 15.4* 9.6 8.9 7.7  NEUTROABS 13.4* 8.6* 6.5 5.1  HGB 13.7 11.6* 12.0* 12.7*  HCT 40.5 34.2* 36.5* 38.2*  MCV 89.4 90.0 91.0 90.3  PLT 259 210 251 266   Cardiac Enzymes: No results for input(s):  CKTOTAL, CKMB, CKMBINDEX, TROPONINI in the last 168 hours. BNP: Invalid input(s): POCBNP CBG: No results for input(s): GLUCAP in the last 168 hours. D-Dimer No results for input(s): DDIMER in the last 72 hours. Hgb A1c No results for input(s): HGBA1C in the last 72 hours. Lipid Profile No results for input(s): CHOL, HDL, LDLCALC, TRIG, CHOLHDL, LDLDIRECT in the last 72 hours. Thyroid function studies No results for input(s): TSH, T4TOTAL, T3FREE, THYROIDAB in the last 72 hours.  Invalid input(s): FREET3 Anemia work up No results for input(s): VITAMINB12, FOLATE, FERRITIN, TIBC, IRON, RETICCTPCT in the last 72 hours. Urinalysis    Component Value Date/Time   COLORURINE ORANGE (A) 01/01/2020 1754   APPEARANCEUR CLOUDY (A) 01/01/2020 1754   LABSPEC 1.020 01/01/2020 1754   PHURINE 5.0 01/01/2020 1754   GLUCOSEU 250 (A) 01/01/2020 1754   HGBUR SMALL (A) 01/01/2020 1754   BILIRUBINUR SMALL (A) 01/01/2020 1754   KETONESUR TRACE (A) 01/01/2020 1754   PROTEINUR >300 (A) 01/01/2020 1754   NITRITE POSITIVE (A) 01/01/2020 1754   LEUKOCYTESUR LARGE (A) 01/01/2020 1754   Sepsis Labs Invalid input(s): PROCALCITONIN,  WBC,  LACTICIDVEN Microbiology Recent Results (from the past 240 hour(s))  Urine culture     Status: Abnormal (Preliminary result)   Collection Time: 01/01/20  5:54 PM   Specimen: In/Out Cath Urine  Result Value Ref Range Status   Specimen Description   Final    IN/OUT CATH URINE Performed at [facility], [address], [place], [place] [address]    Special Requests   Final    NONE Performed at [facility], [address], [place], [place] [address]    Culture (A)  Final    >=100,000 COLONIES/mL ESCHERICHIA COLI SUSCEPTIBILITIES TO FOLLOW Performed at [facility], [place] [address], [place],  [address]    Report Status PENDING  Incomplete  Blood Culture (routine x 2)     Status: None (Preliminary result)   Collection Time: 01/01/20  6:04 PM    Specimen:  Right Antecubital; Blood  Result Value Ref Range Status   Specimen Description RIGHT ANTECUBITAL  Final   Special Requests   Final    BOTTLES DRAWN AEROBIC AND ANAEROBIC Blood Culture adequate volume   Culture   Final    NO GROWTH 2 DAYS Performed at [facility], [address] [place], [place] [address]    Report Status PENDING  Incomplete  Blood Culture (routine x 2)     Status: None (Preliminary result)   Collection Time: 01/01/20  6:07 PM   Specimen: Left Antecubital; Blood  Result Value Ref Range Status   Specimen Description LEFT ANTECUBITAL  Final   Special Requests   Final    BOTTLES DRAWN AEROBIC AND ANAEROBIC Blood Culture adequate volume   Culture   Final    NO GROWTH 2 DAYS Performed at [facility], [address] [place], [place] [address]    Report Status PENDING  Incomplete  SARS Coronavirus 2 by RT PCR (hospital order, performed in [place] hospital lab) Nasopharyngeal Nasopharyngeal Swab     Status: None   Collection Time: 01/01/20 11:56 PM   Specimen: Nasopharyngeal Swab  Result Value Ref Range Status   SARS Coronavirus 2 NEGATIVE NEGATIVE Final    Comment: (NOTE) SARS-CoV-2 target nucleic acids are NOT DETECTED. The SARS-CoV-2 RNA is generally detectable in upper and lower respiratory specimens during the acute phase of infection. The lowest concentration of SARS-CoV-2 viral copies this assay can detect is 250 copies / mL. A negative result does not preclude SARS-CoV-2 infection and should not be used as the sole basis for treatment or other patient management decisions.  A negative result may occur with improper specimen collection / handling, submission of specimen other than nasopharyngeal swab, presence of viral mutation(s) within the areas targeted by this assay, and inadequate number of viral copies (<250 copies / mL). A negative result must be combined with clinical observations, patient history, and epidemiological information. Fact  Sheet for Patients:   StrictlyIdeas.no Fact Sheet for Healthcare Providers: BankingDealers.co.za This test is not yet approved or cleared  by the [place] FDA and has been authorized for detection and/or diagnosis of SARS-CoV-2 by FDA under an Emergency Use Authorization (EUA).  This EUA will remain in effect (meaning this test can be used) for the duration of the COVID-19 declaration under Section 564(b)(1) of the Act, 21 U.S.C. section 360bbb-3(b)(1), unless the authorization is terminated or revoked sooner. Performed at [facility], [address] [place], [place] [address]      Time coordinating discharge:  I have spent 35 minutes face to face with the patient and on the ward discussing the patients care, assessment, plan and disposition with other care givers. >50% of the time was devoted counseling the patient about the risks and benefits of treatment/Discharge disposition and coordinating care.   SIGNED:   Damita Lack, MD  [facility] 01/04/2020, 8:27 AM   If 7PM-7AM, please contact night-coverage

## 2020-01-04 NOTE — Discharge Instructions (Signed)
Indwelling Urinary Catheter Care, Adult An indwelling urinary catheter is a thin tube that is put into your bladder. The tube helps to drain pee (urine) out of your body. The tube goes in through your urethra. Your urethra is where pee comes out of your body. Your pee will come out through the catheter, then it will go into a bag (drainage bag). Take good care of your catheter so it will work well. How to wear your catheter and bag Supplies needed  Sticky tape (adhesive tape) or a leg strap.  Alcohol wipe or soap and water (if you use tape).  A clean towel (if you use tape).  Large overnight bag.  Smaller bag (leg bag). Wearing your catheter Attach your catheter to your leg with tape or a leg strap.  Make sure the catheter is not pulled tight.  If a leg strap gets wet, take it off and put on a dry strap.  If you use tape to hold the bag on your leg: 1. Use an alcohol wipe or soap and water to wash your skin where the tape made it sticky before. 2. Use a clean towel to pat-dry that skin. 3. Use new tape to make the bag stay on your leg. Wearing your bags You should have been given a large overnight bag.  You may wear the overnight bag in the day or night.  Always have the overnight bag lower than your bladder.  Do not let the bag touch the floor.  Before you go to sleep, put a clean plastic bag in a wastebasket. Then hang the overnight bag inside the wastebasket. You should also have a smaller leg bag that fits under your clothes.  Always wear the leg bag below your knee.  Do not wear your leg bag at night. How to care for your skin and catheter Supplies needed  A clean washcloth.  Water and mild soap.  A clean towel. Caring for your skin and catheter      Clean the skin around your catheter every day: 1. Wash your hands with soap and water. 2. Wet a clean washcloth in warm water and mild soap. 3. Clean the skin around your urethra.  If you are  male:  Gently spread the folds of skin around your vagina (labia).  With the washcloth in your other hand, wipe the inner side of your labia on each side. Wipe from front to back.  If you are male:  Pull back any skin that covers the end of your penis (foreskin).  With the washcloth in your other hand, wipe your penis in small circles. Start wiping at the tip of your penis, then move away from the catheter.  Move the foreskin back in place, if needed. 4. With your free hand, hold the catheter close to where it goes into your body.  Keep holding the catheter during cleaning so it does not get pulled out. 5. With the washcloth in your other hand, clean the catheter.  Only wipe downward on the catheter.  Do not wipe upward toward your body. Doing this may push germs into your urethra and cause infection. 6. Use a clean towel to pat-dry the catheter and the skin around it. Make sure to wipe off all soap. 7. Wash your hands with soap and water.  Shower every day. Do not take baths.  Do not use cream, ointment, or lotion on the area where the catheter goes into your body, unless your doctor tells you   to.  Do not use powders, sprays, or lotions on your genital area.  Check your skin around the catheter every day for signs of infection. Check for: ? Redness, swelling, or pain. ? Fluid or blood. ? Warmth. ? Pus or a bad smell. How to empty the bag Supplies needed  Rubbing alcohol.  Gauze pad or cotton ball.  Tape or a leg strap. Emptying the bag Pour the pee out of your bag when it is ?- full, or at least 2-3 times a day. Do this for your overnight bag and your leg bag. 1. Wash your hands with soap and water. 2. Separate (detach) the bag from your leg. 3. Hold the bag over the toilet or a clean pail. Keep the bag lower than your hips and bladder. This is so the pee (urine) does not go back into the tube. 4. Open the pour spout. It is at the bottom of the bag. 5. Empty the  pee into the toilet or pail. Do not let the pour spout touch any surface. 6. Put rubbing alcohol on a gauze pad or cotton ball. 7. Use the gauze pad or cotton ball to clean the pour spout. 8. Close the pour spout. 9. Attach the bag to your leg with tape or a leg strap. 10. Wash your hands with soap and water. Follow instructions for cleaning the drainage bag:  From the product maker.  As told by your doctor. How to change the bag Supplies needed  Alcohol wipes.  A clean bag.  Tape or a leg strap. Changing the bag Replace your bag when it starts to leak, smell bad, or look dirty. 1. Wash your hands with soap and water. 2. Separate the dirty bag from your leg. 3. Pinch the catheter with your fingers so that pee does not spill out. 4. Separate the catheter tube from the bag tube where these tubes connect (at the connection valve). Do not let the tubes touch any surface. 5. Clean the end of the catheter tube with an alcohol wipe. Use a different alcohol wipe to clean the end of the bag tube. 6. Connect the catheter tube to the tube of the clean bag. 7. Attach the clean bag to your leg with tape or a leg strap. Do not make the bag tight on your leg. 8. Wash your hands with soap and water. General rules   Never pull on your catheter. Never try to take it out. Doing that can hurt you.  Always wash your hands before and after you touch your catheter or bag. Use a mild, fragrance-free soap. If you do not have soap and water, use hand sanitizer.  Always make sure there are no twists or bends (kinks) in the catheter tube.  Always make sure there are no leaks in the catheter or bag.  Drink enough fluid to keep your pee pale yellow.  Do not take baths, swim, or use a hot tub.  If you are male, wipe from front to back after you poop (have a bowel movement). Contact a doctor if:  Your pee is cloudy.  Your pee smells worse than usual.  Your catheter gets clogged.  Your catheter  leaks.  Your bladder feels full. Get help right away if:  You have redness, swelling, or pain where the catheter goes into your body.  You have fluid, blood, pus, or a bad smell coming from the area where the catheter goes into your body.  Your skin feels warm where   the catheter goes into your body.  You have a fever.  You have pain in your: ? Belly (abdomen). ? Legs. ? Lower back. ? Bladder.  You see blood in the catheter.  Your pee is pink or red.  You feel sick to your stomach (nauseous).  You throw up (vomit).  You have chills.  Your pee is not draining into the bag.  Your catheter gets pulled out. Summary  An indwelling urinary catheter is a thin tube that is placed into the bladder to help drain pee (urine) out of the body.  The catheter is placed into the part of the body that drains pee from the bladder (urethra).  Taking good care of your catheter will keep it working properly and help prevent problems.  Always wash your hands before and after touching your catheter or bag.  Never pull on your catheter or try to take it out. This information is not intended to replace advice given to you by your health care provider. Make sure you discuss any questions you have with your health care provider. Document Revised: 11/30/2018 Document Reviewed: 03/24/2017 Elsevier Patient Education  2020 [employer]  

## 2020-01-05 LAB — HIV-1 RNA QUANT-NO REFLEX-BLD
HIV 1 RNA Quant: <20 copies/mL
LOG10 HIV-1 RNA: UNDETERMINED log10copy/mL

## 2020-01-06 ENCOUNTER — Telehealth: Payer: Self-pay | Admitting: *Deleted

## 2020-01-06 LAB — CULTURE, BLOOD (ROUTINE X 2)
Culture: NO GROWTH
Culture: NO GROWTH
Special Requests: ADEQUATE
Special Requests: ADEQUATE

## 2020-01-06 NOTE — Telephone Encounter (Signed)
    Transitional Care Management  Contact Attempt Attempt Date:01/06/2020 Attempted By: Roselyn Reef h Beretta Ginsberg, LPN   1st unsuccessful TCM contact attempt.   I reached out to [name] on his preferred telephone number to discuss Transitional Care Management, medication reconciliation, and to schedule a TCM hospital follow-up with his PCP at [facility].  Discharge Date: 01/04/20 Location: [facility] to home  Discharge Dx: sepsis secondary to UTI   Recommendations for Outpatient Follow-up:   (insert from discharge summary) Recommendations for Outpatient Follow-up:  1. Follow up with PCP in 1-2 weeks. Inbox message sent to Dr Lajuana Ripple regarding his follow-up care 2. Please obtain BMP/CBC in one week your next doctors visit.  3. Confirmatory HIV results pending at the time of discharge 4. Keflex orally for 8 more days 5. Maintain Foley for 1 week, follow-up outpatient neurology in 1 week for post voiding trial.  Plan I left a HIPPA compliant message for him to return my call.  Will attempt to contact again within the 2 business day post discharge window if he does not return my call.

## 2020-01-06 NOTE — Telephone Encounter (Signed)
TRANSITIONAL CARE MANAGEMENT TELEPHONE OUTREACH NOTE   Contact Date: 01/06/2020 Contacted By: Ralph Cove, LPN    DISCHARGE INFORMATION Date of Discharge:01/04/20 Discharge Facility: [facility] Discharge Diagnosis:Sepsis ( secondary to UTI)    Outpatient Follow Up Recommendations (copied from discharge summary) Recommendations for Outpatient Follow-up:  1. Follow up with PCP in 1-2 weeks. Inbox message sent to Dr Ralph Stevens regarding his follow-up care 2. Please obtain BMP/CBC in one week your next doctors visit.  3. Confirmatory HIV results pending at the time of discharge 4. Keflex orally for 8 more days 5. Maintain Foley for 1 week, follow-up outpatient neurology in 1 week for post voiding trial  Ralph Stevens is a male primary care patient of Ralph Norlander, DO. An outgoing telephone call was made today and I spoke with patient, himself.  [name] condition(s) and treatment(s) were discussed. An opportunity to ask questions was provided and all were answered or forwarded as appropriate.    ACTIVITIES OF DAILY LIVING  Ralph Stevens lives with their spouse and he can perform ADLs independently. his primary caregiver is himself. he is able to depend on his primary caregiver(s) for consistent help. Transportation to appointments, to pick up medications, and to run errands is not a problem.  (Consider referral to [facility] if transportation or a consistent caregiver is a problem)   Fall Risk Fall Risk  09/20/2019 09/02/2019  Falls in the past year? 0 0    low [place] Modifications/Assistive Devices Wheelchair: No Cane: No Ramp: No Bedside Toilet: No Hospital Bed:  No Other: catheter in place currently    [place] he is not receiving home health- no services.     MEDICATION RECONCILIATION  [name] has been able to pick-up all prescribed discharge medications from the pharmacy.   A post discharge medication  reconciliation was performed and the complete medication list was reviewed with the patient/caregiver and is current as of 01/06/2020. Changes highlighted below.  Discontinued Medications None were stopped   Current Medication List Allergies as of 01/06/2020   No Known Allergies     Medication List       Accurate as of Jan 06, 2020  9:42 AM. If you have any questions, ask your nurse or doctor.        alfuzosin 10 MG 24 hr tablet Commonly known as: UROXATRAL Take 1 tablet (10 mg total) by mouth daily with breakfast.   amLODipine 5 MG tablet Commonly known as: NORVASC Take 1 tablet (5 mg total) by mouth daily.   aspirin 81 MG chewable tablet Chew 81 mg by mouth in the morning.   atorvastatin 10 MG tablet Commonly known as: LIPITOR Take 10 mg by mouth at bedtime.   cephALEXin 500 MG capsule Commonly known as: KEFLEX Take 1 capsule (500 mg total) by mouth 4 (four) times daily for 8 days.   esomeprazole 20 MG capsule Commonly known as: NEXIUM Take 20 mg by mouth in the morning.   fluticasone 50 MCG/ACT nasal spray Commonly known as: FLONASE Place 1-2 sprays into both nostrils daily as needed for allergies or rhinitis.   hydrochlorothiazide 12.5 MG capsule Commonly known as: MICROZIDE Take 12.5 mg by mouth daily.   lisinopril 40 MG tablet Commonly known as: ZESTRIL Take 1 tablet (40 mg total) by mouth daily.   metoprolol tartrate 50 MG tablet Commonly known as: LOPRESSOR Take 1 tablet (50 mg total) by mouth 2 (two)  times daily.        PATIENT EDUCATION & FOLLOW-UP PLAN  An appointment for Transitional Care Management is scheduled with ONYJE, NP on 01/10/2020 at 1100 am.  Take all medications as prescribed  Contact our office by calling [phone] if you have any questions or concerns

## 2020-01-08 LAB — HIV-1/2 AB - DIFFERENTIATION
HIV 1 Ab: NEGATIVE
HIV 2 Ab: NEGATIVE
Note: NEGATIVE

## 2020-01-08 LAB — RNA QUALITATIVE: HIV 1 RNA Qualitative: 1

## 2020-01-10 ENCOUNTER — Encounter: Payer: Self-pay | Admitting: Nurse Practitioner

## 2020-01-10 ENCOUNTER — Ambulatory Visit (INDEPENDENT_AMBULATORY_CARE_PROVIDER_SITE_OTHER): Payer: Medicare Other | Admitting: Nurse Practitioner

## 2020-01-10 ENCOUNTER — Other Ambulatory Visit: Payer: Self-pay

## 2020-01-10 VITALS — BP 144/73 | HR 98 | Temp 97.6°F | Resp 20 | Ht 72.0 in | Wt 197.0 lb

## 2020-01-10 DIAGNOSIS — Z09 Encounter for follow-up examination after completed treatment for conditions other than malignant neoplasm: Secondary | ICD-10-CM | POA: Insufficient documentation

## 2020-01-10 DIAGNOSIS — A419 Sepsis, unspecified organism: Secondary | ICD-10-CM | POA: Diagnosis not present

## 2020-01-10 DIAGNOSIS — N390 Urinary tract infection, site not specified: Secondary | ICD-10-CM | POA: Diagnosis not present

## 2020-01-10 DIAGNOSIS — I10 Essential (primary) hypertension: Secondary | ICD-10-CM | POA: Diagnosis not present

## 2020-01-10 DIAGNOSIS — R7309 Other abnormal glucose: Secondary | ICD-10-CM | POA: Diagnosis not present

## 2020-01-10 NOTE — Assessment & Plan Note (Signed)
Symptoms well controlled, went over hospital discharge instructions with patient and what to expect . Education provided on how to prevent UTI in the future and when to call the doctor. Printed handout given to patients.  Confirmed patients upcoming appointment to see urology 01/14/20. Labs collected. patient advised to call with worsening or unresolved symptoms.

## 2020-01-10 NOTE — Patient Instructions (Signed)
Urosepsis, Adult  Urosepsis is a type of sepsis. Sepsis is a severe bodily reaction to an infection. Urosepsis is caused by a bacterial infection that starts in the urinary tract and spreads to the blood. The urinary tract is the system where urine is made, stored, and passed out of the body and includes the kidneys, ureters, bladder, and urethra. This may also be called the urinary system. In severe cases, sepsis can lead to septic shock. Septic shock can weaken your heart and cause your blood pressure to drop. This can make the body's central nervous system and other vital organs stop working. Urosepsis is a medical emergency that requires immediate treatment in a hospital. What are the causes? Common causes of this condition include:  A urinary tract infection (UTI) that spreads to your blood.  A urinary tract blockage due to kidney stones.  Swelling and inflammation of the prostate (prostatitis) or prostate infection, in males. What increases the risk? You are more likely to develop this condition if you:  Are male, especially if you are sexually active.  Are age 65 or older.  Have a long-term disease, such as kidney disease or diabetes.  Have a weak disease-fighting system (immune system).  Have a condition that lessens or changes urine flow, such as a kidney or bladder stone, prostate disease, or a tumor in the urinary tract.  Have had surgery in an area of the urinary tract.  Have a small, thin tube in your urethra that drains urine from your bladder for a period of time (indwelling urinary catheter).  Have lost feeling below the waist or are in a wheelchair. What are the signs or symptoms? Early symptoms of this condition are similar to symptoms of a severe UTI. Common symptoms of this condition include:  Pain in your side, back, or lower abdomen.  Fever and chills.  Nausea and vomiting.  Frequent need to pass urine.  Burning pain when passing urine.  Bloody or  cloudy urine.  Bad-smelling urine.  Fatigue.  Trouble passing urine or not being able to pass urine at all. Once the infection has spread to the blood and a sepsis reaction starts, other symptoms may include:  Chills with shaking.  Cold and clammy skin.  A fever of 101.3F (38.5C) or higher.  Low body temperature of 96.8F (36C) or lower.  Fast breathing.  Trouble breathing.  Fast heartbeat.  Severe pain in the abdomen.  Muscle aches.  Anxiety.  Problems staying awake.  Confusion.  Fainting. How is this diagnosed? This condition is diagnosed based on your symptoms, your medical history, and a physical exam. You may also have:  Urine tests or blood tests to check kidney function and to look for infection.  Imaging tests such as a CT scan or ultrasound to check for blockages in the urinary system. How is this treated? This condition is a medical emergency that needs to be treated right away in the hospital. This condition may be treated with:  An IV so that you can quickly receive: ? Antibiotic medicines. ? Fluids. ? Medicines to support blood pressure.  Oxygen and breathing support, if needed.  Removing a urinary catheter if it is the source of the infection, if this applies.  Filtering your blood with a machine (dialysis). This process cleans your blood if your kidneys have failed.  Surgery to drain infected areas or restore urine flow. This is rare. Follow these instructions at home: Medicines  Take over-the-counter and prescription medicines only as told   by your health care provider.  If you were prescribed an antibiotic medicine, take it as told by your health care provider. Do not stop using the antibiotic even if you start to feel better. General instructions   Drink enough fluid to keep your urine pale yellow.  Return to your normal activities as told by your health care provider. Ask your health care provider what activities are safe for  you.  Keep all follow-up visits as told by your health care provider. This is important. Contact a health care provider if:  You have symptoms that get worse or do not get better with treatment.  You have new UTI symptoms. Get help right away if:  You have new or continued symptoms of sepsis after hospitalization, such as: ? A fever of 101.3F (38.5C) or higher. ? Low body temperature of 96.8F (36C) or lower. ? Chills. ? Severe pain. ? Difficulty breathing. ? Confusion. ? Sleepiness. ? Nausea and vomiting. These symptoms may represent a serious problem that is an emergency. Do not wait to see if the symptoms will go away. Get medical help right away. Call your local emergency services (911 in the [place] Do not drive yourself to the hospital. Summary  Urosepsis is a type of sepsis. Sepsis is a severe bodily reaction to an infection.  Urosepsis is a medical emergency that requires immediate treatment in a hospital.  Possible causes of urosepsis include a urinary tract infection that spreads to your blood, blockage from kidney stones, and prostate swelling or infection in males.  This condition may be treated with an IV so that you can quickly receive antibiotic medicines, fluids, and medicines to support your blood pressure. Other treatments may be used as well.  Get help right away if you have new or continued symptoms of sepsis after hospitalization. This information is not intended to replace advice given to you by your health care provider. Make sure you discuss any questions you have with your health care provider. Document Revised: 09/05/2018 Document Reviewed: 09/06/2018 Elsevier Patient Education  2020 Elsevier Inc.  

## 2020-01-10 NOTE — Progress Notes (Signed)
Established Patient Office Visit  Subjective:  Patient ID: Ralph Stevens, male    DOB: [birth year]  Age: 69 y.o. MRN: [id number]  CC:  Chief Complaint  Patient presents with  . Transitions Of Care    hosp follow up - sepsis due to UTI     HPI Ralph Stevens presents for follow up after sepsis due to urinary tract infection. He is 7 days post hospital admission, Patient is complaining of weakness and extreme tiredness.  no pain, nausea . He reports that his appetite is gradually coming back and he is able to eat a meal.   Past Medical History:  Diagnosis Date  . Acid reflux   . Arthritis   . Arthritis   . Enlarged prostate    PSA goes up and down   . GERD (gastroesophageal reflux disease)   . High cholesterol   . Hyperlipidemia   . Hypertension     Past Surgical History:  Procedure Laterality Date  . EYE SURGERY    . TONSILLECTOMY     age 69     Family History  Problem Relation Age of Onset  . Cancer Mother   . Colon cancer Mother   . Dementia Mother   . Alzheimer's disease Mother   . Emphysema Father   . Hypothyroidism Sister   . Thyroid disease Sister   . Hypertension Sister   . Multiple sclerosis Daughter   . Heart defect Daughter   . Cancer Maternal Grandmother        colon  . Heart disease Maternal Grandfather   . Heart disease Paternal Grandmother   . Cancer Paternal Grandfather        unknown ( mouth/throat)    Social History   Socioeconomic History  . Marital status: Married    Spouse name: [name]  . Number of children: 3  . Years of education: Not on file  . Highest education level: Not on file  Occupational History    Comment: semi-retired   Tobacco Use  . Smoking status: Never Smoker  . Smokeless tobacco: Never Used  Substance and Sexual Activity  . Alcohol use: Never  . Drug use: Never  . Sexual activity: Not on file  Other Topics Concern  . 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  He currently is unemployed but is actively looking for work as he enjoys being busy.   He recently relocated from [place] and resides locally with his wife.  He has a son that lives in [place] but his other 2 daughters are residing in [place].   Social Determinants of Health   Financial Resource Strain:   . Difficulty of Paying Living Expenses:   Food Insecurity:   . Worried About Charity fundraiser in the Last Year:   . Arboriculturist in the Last Year:   Transportation Needs:   . Film/video editor (Medical):   Marland Kitchen Lack of Transportation (Non-Medical):   Physical Activity:   . Days of Exercise per Week:   . Minutes of Exercise per Session:   Stress:   . Feeling of Stress :   Social Connections:   . Frequency of Communication with Friends and Family:   . Frequency of Social Gatherings with Friends and Family:   . Attends Religious Services:   . Active Member of Clubs or Organizations:   .  Attends Archivist Meetings:   Marland Kitchen Marital Status:   Intimate Partner Violence:   . Fear of Current or Ex-Partner:   . Emotionally Abused:   Marland Kitchen Physically Abused:   . Sexually Abused:     Outpatient Medications Prior to Visit  Medication Sig Dispense Refill  . alfuzosin (UROXATRAL) 10 MG 24 hr tablet Take 1 tablet (10 mg total) by mouth daily with breakfast. 90 tablet 3  . amLODipine (NORVASC) 5 MG tablet Take 1 tablet (5 mg total) by mouth daily. 90 tablet 3  . aspirin 81 MG chewable tablet Chew 81 mg by mouth in the morning.     Marland Kitchen atorvastatin (LIPITOR) 10 MG tablet Take 10 mg by mouth at bedtime.     . cephALEXin (KEFLEX) 500 MG capsule Take 1 capsule (500 mg total) by mouth 4 (four) times daily for 8 days. 32 capsule 0  . esomeprazole (NEXIUM) 20 MG capsule Take 20 mg by mouth in the morning.     . fluticasone (FLONASE) 50 MCG/ACT nasal spray Place 1-2 sprays into both nostrils daily as needed for allergies or rhinitis.     Marland Kitchen  lisinopril (ZESTRIL) 40 MG tablet Take 1 tablet (40 mg total) by mouth daily. 90 tablet 0  . metoprolol tartrate (LOPRESSOR) 50 MG tablet Take 1 tablet (50 mg total) by mouth 2 (two) times daily. 180 tablet 3  . hydrochlorothiazide (MICROZIDE) 12.5 MG capsule Take 12.5 mg by mouth daily.     No facility-administered medications prior to visit.    No Known Allergies  ROS Review of Systems  Constitutional: Negative for activity change, appetite change, chills and fever.  HENT: Negative.   Eyes: Negative.   Respiratory: Negative for chest tightness and shortness of breath.   Cardiovascular: Negative.   Gastrointestinal: Negative.   Genitourinary: Positive for difficulty urinating. Negative for flank pain, frequency and urgency.       Patient has an indwelling cath.  Neurological: Negative for headaches.  Psychiatric/Behavioral: The patient is not nervous/anxious.       Objective:    Physical Exam  Constitutional: He is oriented to person, place, and time. He appears well-developed and well-nourished.  HENT:  Head: Normocephalic.  Eyes: Conjunctivae are normal.  Cardiovascular: Normal rate and regular rhythm.  Pulmonary/Chest: Breath sounds normal.  Abdominal: Bowel sounds are normal.  Genitourinary:    Genitourinary Comments: Indwelling catheter in place   Musculoskeletal:        General: No tenderness.     Cervical back: Neck supple.  Neurological: He is alert and oriented to person, place, and time.  Skin: Skin is warm. No rash noted.  Psychiatric: He has a normal mood and affect. His behavior is normal.    BP (!) 144/73 (BP Location: Right Arm, Cuff Size: Large)   Pulse 98   Temp 97.6 F (36.4 C)   Resp 20   Ht 6' (1.829 m)   Wt 197 lb (89.4 kg)   SpO2 96%   BMI 26.72 kg/m  Wt Readings from Last 3 Encounters:  01/10/20 197 lb (89.4 kg)  01/01/20 190 lb (86.2 kg)  09/24/19 193 lb (87.5 kg)     Health Maintenance Due  Topic Date Due  . COVID-19 Vaccine (1)  Never done     Lab Results  Component Value Date   WBC 7.7 01/04/2020   HGB 12.7 (L) 01/04/2020   HCT 38.2 (L) 01/04/2020   MCV 90.3 01/04/2020   PLT 266 01/04/2020  Lab Results  Component Value Date   NA 141 01/04/2020   K 3.4 (L) 01/04/2020   CO2 23 01/04/2020   GLUCOSE 98 01/04/2020   BUN 10 01/04/2020   CREATININE 0.81 01/04/2020   BILITOT 1.2 01/01/2020   ALKPHOS 77 01/01/2020   AST 14 (L) 01/01/2020   ALT 16 01/01/2020   PROT 7.1 01/01/2020   ALBUMIN 3.1 (L) 01/01/2020   CALCIUM 8.3 (L) 01/04/2020   ANIONGAP 8 01/04/2020      Assessment & Plan:  Hospital discharge follow-up Symptoms well controlled, went over hospital discharge instructions with patient and what to expect . Education provided on how to prevent UTI in the future and when to call the doctor. Printed handout given to patients.  Confirmed patients upcoming appointment to see urology 01/14/20. Labs collected. patient advised to call with worsening or unresolved symptoms.   Problem List Items Addressed This Visit    None    Visit Diagnoses    Hospital discharge follow-up    -  Primary   Relevant Orders   BMP8+EGFR   CBC with Differential/Platelet        Follow-up: Return if symptoms worsen or fail to improve.    Ivy Lynn, NP

## 2020-01-11 LAB — CBC WITH DIFFERENTIAL/PLATELET
Basophils Absolute: 0.1 10*3/uL (ref 0.0–0.2)
Basos: 1 %
EOS (ABSOLUTE): 0.2 10*3/uL (ref 0.0–0.4)
Eos: 2 %
Hematocrit: 42.8 % (ref 37.5–51.0)
Hemoglobin: 14.2 g/dL (ref 13.0–17.7)
Immature Grans (Abs): 0.2 10*3/uL — ABNORMAL HIGH (ref 0.0–0.1)
Immature Granulocytes: 2 %
Lymphocytes Absolute: 1.9 10*3/uL (ref 0.7–3.1)
Lymphs: 21 %
MCH: 30 pg (ref 26.6–33.0)
MCHC: 33.2 g/dL (ref 31.5–35.7)
MCV: 90 fL (ref 79–97)
Monocytes Absolute: 0.6 10*3/uL (ref 0.1–0.9)
Monocytes: 7 %
Neutrophils Absolute: 6 10*3/uL (ref 1.4–7.0)
Neutrophils: 67 %
Platelets: 496 10*3/uL — ABNORMAL HIGH (ref 150–450)
RBC: 4.74 x10E6/uL (ref 4.14–5.80)
RDW: 13.1 % (ref 11.6–15.4)
WBC: 9 10*3/uL (ref 3.4–10.8)

## 2020-01-11 LAB — BMP8+EGFR
BUN/Creatinine Ratio: 18 (ref 10–24)
BUN: 14 mg/dL (ref 8–27)
CO2: 22 mmol/L (ref 20–29)
Calcium: 9 mg/dL (ref 8.6–10.2)
Chloride: 106 mmol/L (ref 96–106)
Creatinine, Ser: 0.78 mg/dL (ref 0.76–1.27)
GFR calc Af Amer: 107 mL/min/{1.73_m2} (ref >59)
GFR calc non Af Amer: 93 mL/min/{1.73_m2} (ref >59)
Glucose: 116 mg/dL — ABNORMAL HIGH (ref 65–99)
Potassium: 4.1 mmol/L (ref 3.5–5.2)
Sodium: 143 mmol/L (ref 134–144)

## 2020-01-14 ENCOUNTER — Ambulatory Visit: Payer: Medicare Other | Admitting: Urology

## 2020-01-14 ENCOUNTER — Other Ambulatory Visit: Payer: Self-pay

## 2020-01-14 ENCOUNTER — Encounter: Payer: Self-pay | Admitting: Urology

## 2020-01-14 VITALS — BP 153/84 | HR 97 | Temp 98.1°F | Ht 72.0 in | Wt 196.0 lb

## 2020-01-14 DIAGNOSIS — N3000 Acute cystitis without hematuria: Secondary | ICD-10-CM

## 2020-01-14 MED ORDER — SULFAMETHOXAZOLE-TRIMETHOPRIM 800-160 MG PO TABS: 1.0000 | ORAL_TABLET | Freq: Two times a day (BID) | ORAL | 0 refills | Status: DC

## 2020-01-14 NOTE — Progress Notes (Signed)
See notes

## 2020-01-14 NOTE — Progress Notes (Signed)
Fill and Pull Catheter Removal  Patient is present today for a catheter removal.  Patient was cleaned and prepped in a sterile fashion 160ml of sterile water/ saline was instilled into the bladder when the patient felt the urge to urinate. 69ml of water was then drained from the balloon.  A 16FR foley cath was removed from the bladder no complications were noted .  Patient as then given some time to void on their own.  Patient can void  175ml on their own after some time.  Patient tolerated well.  Performed by: Dublin Grayer  Follow up/ Additional notes: Per MD note

## 2020-01-14 NOTE — Progress Notes (Signed)
H&P  Chief Complaint: Hospital f/u -- Urinary Retention  History of Present Illness:   5.25.2021: Last seen in February 2021, he returns for follow-up on recent 3 day hospitalization for sepsis and urinary retention. He reports that he first became symptomatic with flu like sx's on 5.9.2021 which worsened up until his presentation on 5.12.2021 with a fever >103. He states that his urinary pattern and flow had been worsening with onset of his initial sx's but was still able to urinate when he arrived at ER -- he eventually became unable to urinate and required catheterization. He has been on alfuzosin 1x daily since his hospitalization.   Also of note, he feels like he has recently developed a minor yeast infection -- noting mild erythema and discomfort.   Past Medical History:  Diagnosis Date  . Acid reflux   . Arthritis   . Arthritis   . Enlarged prostate    PSA goes up and down   . GERD (gastroesophageal reflux disease)   . High cholesterol   . Hyperlipidemia   . Hypertension     Past Surgical History:  Procedure Laterality Date  . EYE SURGERY    . TONSILLECTOMY     age 46     Home Medications:  Allergies as of 01/14/2020   No Known Allergies     Medication List       Accurate as of Jan 14, 2020 10:08 AM. If you have any questions, ask your nurse or doctor.        alfuzosin 10 MG 24 hr tablet Commonly known as: UROXATRAL Take 1 tablet (10 mg total) by mouth daily with breakfast.   amLODipine 5 MG tablet Commonly known as: NORVASC Take 1 tablet (5 mg total) by mouth daily.   aspirin 81 MG chewable tablet Chew 81 mg by mouth in the morning.   atorvastatin 10 MG tablet Commonly known as: LIPITOR Take 10 mg by mouth at bedtime.   esomeprazole 20 MG capsule Commonly known as: NEXIUM Take 20 mg by mouth in the morning.   fluticasone 50 MCG/ACT nasal spray Commonly known as: FLONASE Place 1-2 sprays into both nostrils daily as needed for allergies or  rhinitis.   lisinopril 40 MG tablet Commonly known as: ZESTRIL Take 1 tablet (40 mg total) by mouth daily.   metoprolol tartrate 50 MG tablet Commonly known as: LOPRESSOR Take 1 tablet (50 mg total) by mouth 2 (two) times daily.       Allergies: No Known Allergies  Family History  Problem Relation Age of Onset  . Cancer Mother   . Colon cancer Mother   . Dementia Mother   . Alzheimer's disease Mother   . Emphysema Father   . Hypothyroidism Sister   . Thyroid disease Sister   . Hypertension Sister   . Multiple sclerosis Daughter   . Heart defect Daughter   . Cancer Maternal Grandmother        colon  . Heart disease Maternal Grandfather   . Heart disease Paternal Grandmother   . Cancer Paternal Grandfather        unknown ( mouth/throat)    Social History:  reports that he has never smoked. He has never used smokeless tobacco. He reports that he does not drink alcohol or use drugs.  ROS: A complete review of systems was performed.  All systems are negative except for pertinent findings as noted.  Physical Exam:  Vital signs in last 24 hours: BP (!) 153/84  Pulse 97   Temp 98.1 F (36.7 C)   Ht 6' (1.829 m)   Wt 196 lb (88.9 kg)   BMI 26.58 kg/m  Constitutional:  Alert and oriented, No acute distress Cardiovascular: Regular rate  Respiratory: Normal respiratory effort Genitourinary: Indwelling catheter present. Neurologic: Grossly intact, no focal deficits Psychiatric: Normal mood and affect  Renal Function: Recent Labs    01/10/20 1133  CREATININE 0.78   I have reviewed prior pt notes  I have reviewed notes from referring/previous physicians  I have reviewed urinalysis results  I have independently reviewed prior imaging  I have reviewed prior PSA results  I have reviewed prior urine culture Impression/Assessment:  Catheter removed today for TOV -- seemingly successful with him voiding all of instilled volume though with a weak stream. He is  already on alfuzosin 1x daily -- he may benefit from temporarily doubling up on this. We discussed surgical interventions as possible next steps for management of his BPH but given how well he was urinating at his baseline I do not think we will need to pursue these at this time nor in the near future.    Plan:  1. Advised to start taking alfuzosin 2x daily   2. Catheter removed today. This was covered by 3 day course of abx. He was given explicit instructions to return should he find himself still unable to urinate.  3. Return for OV in 2 wks w/ PVR  4. Advised to use OTC antifungal cream for management of recent balanitis.

## 2020-01-15 LAB — SPECIMEN STATUS REPORT

## 2020-01-15 LAB — HGB A1C W/O EAG: Hgb A1c MFr Bld: 5.9 % — ABNORMAL HIGH (ref 4.8–5.6)

## 2020-01-28 ENCOUNTER — Encounter: Payer: Self-pay | Admitting: Urology

## 2020-01-28 ENCOUNTER — Encounter (INDEPENDENT_AMBULATORY_CARE_PROVIDER_SITE_OTHER): Payer: Medicare Other | Admitting: Ophthalmology

## 2020-01-28 ENCOUNTER — Ambulatory Visit: Payer: Medicare Other | Admitting: Urology

## 2020-01-28 ENCOUNTER — Other Ambulatory Visit: Payer: Self-pay

## 2020-01-28 VITALS — BP 145/87 | HR 86 | Temp 98.4°F | Ht 72.0 in | Wt 193.0 lb

## 2020-01-28 DIAGNOSIS — N401 Enlarged prostate with lower urinary tract symptoms: Secondary | ICD-10-CM | POA: Diagnosis not present

## 2020-01-28 DIAGNOSIS — R350 Frequency of micturition: Secondary | ICD-10-CM | POA: Diagnosis not present

## 2020-01-28 DIAGNOSIS — N3000 Acute cystitis without hematuria: Secondary | ICD-10-CM | POA: Diagnosis not present

## 2020-01-28 LAB — POCT URINALYSIS DIPSTICK
Bilirubin, UA: NEGATIVE
Blood, UA: NEGATIVE
Glucose, UA: NEGATIVE
Ketones, UA: NEGATIVE
Nitrite, UA: NEGATIVE
Protein, UA: NEGATIVE
Spec Grav, UA: 1.01 (ref 1.010–1.025)
Urobilinogen, UA: 0.2 E.U./dL
pH, UA: 6 (ref 5.0–8.0)

## 2020-01-28 LAB — BLADDER SCAN AMB NON-IMAGING: Scan Result: 28.1

## 2020-01-28 MED ORDER — ALFUZOSIN HCL ER 10 MG PO TB24: 10.0000 mg | ORAL_TABLET | Freq: Two times a day (BID) | ORAL | 3 refills | Status: DC

## 2020-01-28 NOTE — Progress Notes (Signed)
H&P  Chief Complaint: Urinary retention  History of Present Illness:   6.8.2021: Here for follow-up after catheter removal 2 wks prior. He has been on alfuzosin 2x daily since last visit and feels as though his urinary pattern and sx's are significantly improved. He still thinks his stream is weaker than before his recently retentive episode but feels as though this is still improving. He does have some dizziness.  Of note, he received both doses of the COVID vaccine and wanted this entered into his chart so he stops getting notified about it.   IPSS Questionnaire (AUA-7): Over the past month.   1)  How often have you had a sensation of not emptying your bladder completely after you finish urinating?  1 - Less than 1 time in 5  2)  How often have you had to urinate again less than two hours after you finished urinating? 3 - About half the time  3)  How often have you found you stopped and started again several times when you urinated?  1 - Less than 1 time in 5  4) How difficult have you found it to postpone urination?  0 - Not at all  5) How often have you had a weak urinary stream?  1 - Less than 1 time in 5  6) How often have you had to push or strain to begin urination?  0 - Not at all  7) How many times did you most typically get up to urinate from the time you went to bed until the time you got up in the morning?  3 - 3 times  Total score:  0-7 mildly symptomatic   8-19 moderately symptomatic   20-35 severely symptomatic   Total: 9 QoL: 2  Past Medical History:  Diagnosis Date  . Acid reflux   . Arthritis   . Arthritis   . Enlarged prostate    PSA goes up and down   . GERD (gastroesophageal reflux disease)   . High cholesterol   . Hyperlipidemia   . Hypertension     Past Surgical History:  Procedure Laterality Date  . EYE SURGERY    . TONSILLECTOMY     age 28     Home Medications:  Allergies as of 01/28/2020   No Known Allergies     Medication List       Accurate as of January 28, 2020 10:34 AM. If you have any questions, ask your nurse or doctor.        alfuzosin 10 MG 24 hr tablet Commonly known as: UROXATRAL Take 1 tablet (10 mg total) by mouth daily with breakfast.   amLODipine 5 MG tablet Commonly known as: NORVASC Take 1 tablet (5 mg total) by mouth daily.   aspirin 81 MG chewable tablet Chew 81 mg by mouth in the morning.   atorvastatin 10 MG tablet Commonly known as: LIPITOR Take 10 mg by mouth at bedtime.   esomeprazole 20 MG capsule Commonly known as: NEXIUM Take 20 mg by mouth in the morning.   fluticasone 50 MCG/ACT nasal spray Commonly known as: FLONASE Place 1-2 sprays into both nostrils daily as needed for allergies or rhinitis.   lisinopril 40 MG tablet Commonly known as: ZESTRIL Take 1 tablet (40 mg total) by mouth daily.   metoprolol tartrate 50 MG tablet Commonly known as: LOPRESSOR Take 1 tablet (50 mg total) by mouth 2 (two) times daily.   sulfamethoxazole-trimethoprim 800-160 MG tablet Commonly known as: BACTRIM DS Take  1 tablet by mouth 2 (two) times daily.       Allergies: No Known Allergies  Family History  Problem Relation Age of Onset  . Cancer Mother   . Colon cancer Mother   . Dementia Mother   . Alzheimer's disease Mother   . Emphysema Father   . Hypothyroidism Sister   . Thyroid disease Sister   . Hypertension Sister   . Multiple sclerosis Daughter   . Heart defect Daughter   . Cancer Maternal Grandmother        colon  . Heart disease Maternal Grandfather   . Heart disease Paternal Grandmother   . Cancer Paternal Grandfather        unknown ( mouth/throat)    Social History:  reports that he has never smoked. He has never used smokeless tobacco. He reports that he does not drink alcohol or use drugs.  ROS: Urological Symptom Review  Patient is experiencing the following symptoms: None Review of Systems Gastrointestinal (upper)  : Negative for upper GI  symptoms Gastrointestinal (lower) : Negative for lower GI symptom Constitutional : Negative for symptoms Skin: Negative for skin symptoms Eyes: Negative for eye symptoms Ear/Nose/Throat : Negative for Ear/Nose/Throat symptom Hematologic/Lymphatic: Negative for Hematologic/Lymphatic symptoms Cardiovascular : Negative for cardiovascular symptoms Respiratory : Negative for respiratory symptoms Endocrine: Negative for endocrine symptoms Musculoskeletal: Negative for musculoskeletal symptoms Neurological: Negative for neurological symptoms Psychologic: Negative for psychiatric symptoms   Physical Exam:  Vital signs in last 24 hours: BP (!) 145/87   Pulse 86   Temp 98.4 F (36.9 C)   Ht 6' (1.829 m)   Wt 193 lb (87.5 kg)   BMI 26.18 kg/m  Constitutional:  Alert and oriented, No acute distress Cardiovascular: Regular rate  Respiratory: Normal respiratory effort Genitourinary: Normal male phallus, testes are descended bilaterally and non-tender and without masses, scrotum is normal in appearance without lesions or masses, perineum is normal on inspection. Prostate feels around 80 grams in size, no nodularity.  Lymphatic: No lymphadenopathy Neurologic: Grossly intact, no focal deficits Psychiatric: Normal mood and affect    Results for orders placed or performed in visit on 01/28/20 (from the past 24 hour(s))  POCT urinalysis dipstick     Status: Abnormal   Collection Time: 01/28/20 10:28 AM  Result Value Ref Range   Color, UA lt yellow    Clarity, UA clear    Glucose, UA Negative Negative   Bilirubin, UA negative    Ketones, UA negative    Spec Grav, UA 1.010 1.010 - 1.025   Blood, UA negative    pH, UA 6.0 5.0 - 8.0   Protein, UA Negative Negative   Urobilinogen, UA 0.2 0.2 or 1.0 E.U./dL   Nitrite, UA negative    Leukocytes, UA Small (1+) (A) Negative   Appearance     Odor     I have reviewed prior pt notes  I have reviewed notes from referring/previous  physicians  I have reviewed urinalysis results  I have independently reviewed prior imaging  I have reviewed prior PSA results--recently 2.6  I have reviewed prior urine culture   Impression/Assessment:  Significantly improved urinary sx's since last visit -- no recurrence of retention. He is fine to try tapering back down to 1x alfuzosin daily if his urinary sx's remain stable. He has been having some light-headedness and this very well may be a side effect of his currently higher dose of alfuzosin.   Plan:  1. Advised to maintain proper bowel  regimen.  2. Continue on afluzosin -- he is fine to try to taper back down to taking this just 1x daily.  3. Return for OV in 6 mo.

## 2020-01-28 NOTE — Progress Notes (Signed)
See progress notes

## 2020-02-18 ENCOUNTER — Ambulatory Visit: Payer: Medicare Other | Admitting: Urology

## 2020-02-18 ENCOUNTER — Ambulatory Visit (INDEPENDENT_AMBULATORY_CARE_PROVIDER_SITE_OTHER): Payer: Medicare Other | Admitting: Family

## 2020-02-18 ENCOUNTER — Other Ambulatory Visit: Payer: Self-pay

## 2020-02-18 ENCOUNTER — Encounter: Payer: Self-pay | Admitting: Family

## 2020-02-18 ENCOUNTER — Other Ambulatory Visit: Payer: Self-pay | Admitting: Family

## 2020-02-18 ENCOUNTER — Ambulatory Visit (INDEPENDENT_AMBULATORY_CARE_PROVIDER_SITE_OTHER): Payer: Medicare Other

## 2020-02-18 VITALS — BP 140/80 | HR 87 | Temp 98.2°F | Ht 72.0 in | Wt 198.8 lb

## 2020-02-18 DIAGNOSIS — R0602 Shortness of breath: Secondary | ICD-10-CM

## 2020-02-18 NOTE — Progress Notes (Signed)
Subjective:    Patient ID: Ralph Stevens, male    DOB: 1951/06/03, 69 y.o.   MRN: [id number]  Chief Complaint  Patient presents with  . Fatigue    not aware of any tick bites  . Breathing Problem    When takes deep breath it hurts   PT presents to the office with intermittent chest tightness with deep breathing that started after he has been discharged from the hospital in May.  Breathing Problem He complains of chest tightness and cough (when taking a deep breath). There is no difficulty breathing, frequent throat clearing, hemoptysis, shortness of breath, sputum production or wheezing. Associated symptoms include dyspnea on exertion and malaise/fatigue. Pertinent negatives include no ear congestion, ear pain, fever, headaches, myalgias, nasal congestion, orthopnea, postnasal drip, rhinorrhea, sneezing, sore throat or trouble swallowing. His symptoms are alleviated by nothing. He reports no improvement on treatment. There is no history of COPD.      Review of Systems  Constitutional: Positive for malaise/fatigue. Negative for fever.  HENT: Negative for ear pain, postnasal drip, rhinorrhea, sneezing, sore throat and trouble swallowing.   Respiratory: Positive for cough (when taking a deep breath). Negative for hemoptysis, sputum production, shortness of breath and wheezing.   Cardiovascular: Positive for dyspnea on exertion.  Musculoskeletal: Negative for myalgias.  Neurological: Negative for headaches.  All other systems reviewed and are negative.      Objective:   Physical Exam Vitals reviewed.  Constitutional:      General: He is not in acute distress.    Appearance: He is well-developed.  HENT:     Head: Normocephalic.  Eyes:     General:        Right eye: No discharge.        Left eye: No discharge.     Pupils: Pupils are equal, round, and reactive to light.  Neck:     Thyroid: No thyromegaly.  Cardiovascular:     Rate and Rhythm: Normal rate and regular  rhythm.     Heart sounds: Normal heart sounds. No murmur heard.   Pulmonary:     Effort: Pulmonary effort is normal. No respiratory distress.     Breath sounds: Normal breath sounds. No wheezing.  Abdominal:     General: Bowel sounds are normal. There is no distension.     Palpations: Abdomen is soft.     Tenderness: There is no abdominal tenderness.  Musculoskeletal:        General: No tenderness. Normal range of motion.     Cervical back: Normal range of motion and neck supple.  Skin:    General: Skin is warm and dry.     Findings: No erythema or rash.  Neurological:     Mental Status: He is alert and oriented to person, place, and time.     Cranial Nerves: No cranial nerve deficit.     Deep Tendon Reflexes: Reflexes are normal and symmetric.  Psychiatric:        Behavior: Behavior normal.        Thought Content: Thought content normal.        Judgment: Judgment normal.      BP 140/80   Pulse 87   Temp 98.2 F (36.8 C) (Temporal)   Ht 6' (1.829 m)   Wt 198 lb 12.8 oz (90.2 kg)   SpO2 97%   BMI 26.96 kg/m       Assessment & Plan:  Ralph Stevens comes in today with chief  complaint of Fatigue (not aware of any tick bites) and Breathing Problem (When takes deep breath it hurts)   Diagnosis and orders addressed:  1. SOB (shortness of breath) - CMP14+EGFR - CBC with Differential/Platelet - EKG 12-Lead  2. Shortness of breath - CT ANGIO CHEST PE W OR WO CONTRAST; Future   Labs pending  Chest x-ray stable Will do CT to rule out PE If SOB worsen go to ED  Evelina Dun, FNP

## 2020-02-18 NOTE — Patient Instructions (Signed)
Shortness of Breath, Adult Shortness of breath is when a person has trouble breathing enough air or when a person feels like she or he is having trouble breathing in enough air. Shortness of breath could be a sign of a medical problem. Follow these instructions at home:   Pay attention to any changes in your symptoms.  Do not use any products that contain nicotine or tobacco, such as cigarettes, e-cigarettes, and chewing tobacco.  Do not smoke. Smoking is a common cause of shortness of breath. If you need help quitting, ask your health care provider.  Avoid things that can irritate your airways, such as: ? Mold. ? Dust. ? Air pollution. ? Chemical fumes. ? Things that can cause allergy symptoms (allergens), if you have allergies.  Keep your living space clean and free of mold and dust.  Rest as needed. Slowly return to your usual activities.  Take over-the-counter and prescription medicines only as told by your health care provider. This includes oxygen therapy and inhaled medicines.  Keep all follow-up visits as told by your health care provider. This is important. Contact a health care provider if:  Your condition does not improve as soon as expected.  You have a hard time doing your normal activities, even after you rest.  You have new symptoms. Get help right away if:  Your shortness of breath gets worse.  You have shortness of breath when you are resting.  You feel light-headed or you faint.  You have a cough that is not controlled with medicines.  You cough up blood.  You have pain with breathing.  You have pain in your chest, arms, shoulders, or abdomen.  You have a fever.  You cannot walk up stairs or exercise the way that you normally do. These symptoms may represent a serious problem that is an emergency. Do not wait to see if the symptoms will go away. Get medical help right away. Call your local emergency services (911 in the [place] Do not drive yourself  to the hospital. Summary  Shortness of breath is when a person has trouble breathing enough air. It can be a sign of a medical problem.  Avoid things that irritate your lungs, such as smoking, pollution, mold, and dust.  Pay attention to changes in your symptoms and contact your health care provider if you have a hard time completing daily activities because of shortness of breath. This information is not intended to replace advice given to you by your health care provider. Make sure you discuss any questions you have with your health care provider. Document Revised: 01/08/2018 Document Reviewed: 01/08/2018 Elsevier Patient Education  2020 [employer] Inc[employer]  

## 2020-02-19 ENCOUNTER — Ambulatory Visit (HOSPITAL_COMMUNITY)
Admission: RE | Admit: 2020-02-19 | Discharge: 2020-02-19 | Disposition: A | Discharge status: Home / Self Care | Payer: Medicare Other | Source: Ambulatory Visit | Attending: Family | Admitting: Family

## 2020-02-19 ENCOUNTER — Other Ambulatory Visit: Payer: Self-pay

## 2020-02-19 DIAGNOSIS — R0602 Shortness of breath: Secondary | ICD-10-CM | POA: Diagnosis not present

## 2020-02-19 LAB — CBC WITH DIFFERENTIAL/PLATELET
Basophils Absolute: 0.1 10*3/uL (ref 0.0–0.2)
Basos: 1 %
EOS (ABSOLUTE): 0.3 10*3/uL (ref 0.0–0.4)
Eos: 4 %
Hematocrit: 42.5 % (ref 37.5–51.0)
Hemoglobin: 14.8 g/dL (ref 13.0–17.7)
Immature Grans (Abs): 0.1 10*3/uL (ref 0.0–0.1)
Immature Granulocytes: 1 %
Lymphocytes Absolute: 2.2 10*3/uL (ref 0.7–3.1)
Lymphs: 28 %
MCH: 30.5 pg (ref 26.6–33.0)
MCHC: 34.8 g/dL (ref 31.5–35.7)
MCV: 88 fL (ref 79–97)
Monocytes Absolute: 0.7 10*3/uL (ref 0.1–0.9)
Monocytes: 9 %
Neutrophils Absolute: 4.4 10*3/uL (ref 1.4–7.0)
Neutrophils: 57 %
Platelets: 336 10*3/uL (ref 150–450)
RBC: 4.85 x10E6/uL (ref 4.14–5.80)
RDW: 13.3 % (ref 11.6–15.4)
WBC: 7.8 10*3/uL (ref 3.4–10.8)

## 2020-02-19 LAB — CMP14+EGFR
ALT: 14 IU/L (ref 0–44)
AST: 15 IU/L (ref 0–40)
Albumin/Globulin Ratio: 1.4 (ref 1.2–2.2)
Albumin: 4.3 g/dL (ref 3.8–4.8)
Alkaline Phosphatase: 78 IU/L (ref 48–121)
BUN/Creatinine Ratio: 10 (ref 10–24)
BUN: 10 mg/dL (ref 8–27)
Bilirubin Total: 0.5 mg/dL (ref 0.0–1.2)
CO2: 23 mmol/L (ref 20–29)
Calcium: 8.7 mg/dL (ref 8.6–10.2)
Chloride: 106 mmol/L (ref 96–106)
Creatinine, Ser: 0.98 mg/dL (ref 0.76–1.27)
GFR calc Af Amer: 91 mL/min/{1.73_m2} (ref >59)
GFR calc non Af Amer: 79 mL/min/{1.73_m2} (ref >59)
Globulin, Total: 3 g/dL (ref 1.5–4.5)
Glucose: 101 mg/dL — ABNORMAL HIGH (ref 65–99)
Potassium: 4.3 mmol/L (ref 3.5–5.2)
Sodium: 142 mmol/L (ref 134–144)
Total Protein: 7.3 g/dL (ref 6.0–8.5)

## 2020-02-19 MED ORDER — IOHEXOL 350 MG/ML SOLN
100.0000 mL | Freq: Once | INTRAVENOUS | Status: AC | PRN
  Administered 2020-02-19: 100 mL via INTRAVENOUS

## 2020-03-02 ENCOUNTER — Ambulatory Visit (INDEPENDENT_AMBULATORY_CARE_PROVIDER_SITE_OTHER): Payer: Medicare Other | Admitting: Family Medicine

## 2020-03-02 ENCOUNTER — Encounter: Payer: Self-pay | Admitting: Family Medicine

## 2020-03-02 ENCOUNTER — Other Ambulatory Visit: Payer: Self-pay

## 2020-03-02 VITALS — BP 140/84 | HR 80 | Temp 98.1°F | Ht 72.0 in | Wt 201.0 lb

## 2020-03-02 DIAGNOSIS — Z789 Other specified health status: Secondary | ICD-10-CM | POA: Diagnosis not present

## 2020-03-02 DIAGNOSIS — I10 Essential (primary) hypertension: Secondary | ICD-10-CM | POA: Diagnosis not present

## 2020-03-02 DIAGNOSIS — R0602 Shortness of breath: Secondary | ICD-10-CM | POA: Diagnosis not present

## 2020-03-02 MED ORDER — METOPROLOL TARTRATE 50 MG PO TABS: 50.0000 mg | ORAL_TABLET | Freq: Two times a day (BID) | ORAL | 3 refills | Status: DC

## 2020-03-02 MED ORDER — LISINOPRIL 40 MG PO TABS: 40.0000 mg | ORAL_TABLET | Freq: Every day | ORAL | 3 refills | Status: DC

## 2020-03-02 NOTE — Patient Instructions (Signed)
I reviewed labs and imaging.    You had a false positive HIV screen in May.  We usually repeat this at least 3 months after test to ensure negativity.  The follow up studies of the initial test were NEGATIVE.  We will plan to repeat that test in the next few months.

## 2020-03-02 NOTE — Progress Notes (Signed)
Subjective: CC: f/u HTN PCP: Ralph Norlander, DO EVO:[id number] Ralph Stevens is a 69 y.o. male presenting to clinic today for:  1. HTN/recent hospitalization Compliant with amlodipine 5, lisinopril 40, metoprolol 50 mg twice daily.  No chest pain.  Shortness of breath that he was experiencing a couple of weeks ago has improved substantially since his visit.  He had a CT which showed no abnormalities except for mild basilar atelectasis.  His labs were totally normal.  He unfortunately did have urosepsis back in May.  During that time there was a HIV screen done which came back positive for the screening test but negative for serologies.  No known exposures.  Urinary symptoms are back to baseline.  He continues to follow-up with urology.  Overall he does feel like he is getting back to his baseline.  Minimal dyspnea but tolerating physical activity.  Not having as much fatigue.   ROS: Per HPI  No Known Allergies Past Medical History:  Diagnosis Date  . Acid reflux   . Arthritis   . Arthritis   . Enlarged prostate    PSA goes up and down   . GERD (gastroesophageal reflux disease)   . High cholesterol   . Hyperlipidemia   . Hypertension     Current Outpatient Medications:  .  alfuzosin (UROXATRAL) 10 MG 24 hr tablet, Take 1 tablet (10 mg total) by mouth in the morning and at bedtime., Disp: 180 tablet, Rfl: 3 .  amLODipine (NORVASC) 5 MG tablet, Take 1 tablet (5 mg total) by mouth daily., Disp: 90 tablet, Rfl: 3 .  aspirin 81 MG chewable tablet, Chew 81 mg by mouth in the morning. , Disp: , Rfl:  .  atorvastatin (LIPITOR) 10 MG tablet, Take 10 mg by mouth at bedtime. , Disp: , Rfl:  .  esomeprazole (NEXIUM) 20 MG capsule, Take 20 mg by mouth in the morning. , Disp: , Rfl:  .  fluticasone (FLONASE) 50 MCG/ACT nasal spray, Place 1-2 sprays into both nostrils daily as needed for allergies or rhinitis. , Disp: , Rfl:  .  lisinopril (ZESTRIL) 40 MG tablet, Take 1 tablet (40 mg total)  by mouth daily., Disp: 90 tablet, Rfl: 0 .  metoprolol tartrate (LOPRESSOR) 50 MG tablet, Take 1 tablet (50 mg total) by mouth 2 (two) times daily., Disp: 180 tablet, Rfl: 3 Social History   Socioeconomic History  . Marital status: Married    Spouse name: [name]  . Number of children: 3  . Years of education: Not on file  . Highest education level: Not on file  Occupational History    Comment: semi-retired   Tobacco Use  . Smoking status: Never Smoker  . Smokeless tobacco: Never Used  Vaping Use  . Vaping Use: Never used  Substance and Sexual Activity  . Alcohol use: Never  . Drug use: Never  . Sexual activity: Not on file  Other Topics Concern  . 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  He currently is unemployed but is actively looking for work as he enjoys being busy.   He recently relocated from [place] and resides locally with his wife.  He has a son that lives in [place] but his other 2 daughters are residing in [place].   Social Determinants of Health   Financial Resource Strain:   . Difficulty of Paying Living Expenses:   Food  Insecurity:   . Worried About Charity fundraiser in the Last Year:   . Arboriculturist in the Last Year:   Transportation Needs:   . Film/video editor (Medical):   Marland Kitchen Lack of Transportation (Non-Medical):   Physical Activity:   . Days of Exercise per Week:   . Minutes of Exercise per Session:   Stress:   . Feeling of Stress :   Social Connections:   . Frequency of Communication with Friends and Family:   . Frequency of Social Gatherings with Friends and Family:   . Attends Religious Services:   . Active Member of Clubs or Organizations:   . Attends Archivist Meetings:   Marland Kitchen Marital Status:   Intimate Partner Violence:   . Fear of Current or Ex-Partner:   . Emotionally Abused:   Marland Kitchen Physically Abused:   . Sexually Abused:    Family  History  Problem Relation Age of Onset  . Cancer Mother   . Colon cancer Mother   . Dementia Mother   . Alzheimer's disease Mother   . Emphysema Father   . Hypothyroidism Sister   . Thyroid disease Sister   . Hypertension Sister   . Multiple sclerosis Daughter   . Heart defect Daughter   . Cancer Maternal Grandmother        colon  . Heart disease Maternal Grandfather   . Heart disease Paternal Grandmother   . Cancer Paternal Grandfather        unknown ( mouth/throat)    Objective: Office vital signs reviewed. BP 140/84   Pulse 80   Temp 98.1 F (36.7 C) (Temporal)   Ht 6' (1.829 m)   Wt 201 lb (91.2 kg)   SpO2 97%   BMI 27.26 kg/m   Physical Examination:  General: Awake, alert, well nourished, No acute distress HEENT: Normal, MMM, sclera white Cardio: regular rate and rhythm, S1S2 heard, no murmurs appreciated Pulm: clear to auscultation bilaterally, no wheezes, rhonchi or rales; normal work of breathing on room air Extremities: warm, well perfused, No edema, cyanosis or clubbing; +1 pulses bilaterally  Assessment/ Plan: 69 y.o. male   1. Essential hypertension Controlled.  Continue current regimen  2. Shortness of breath Resolving  3. False positive HIV serology We discussed that this is likely negative but recommended at least a repeat HIV screen 3 months status post last check - HIV antibody (with reflex); Future    No orders of the defined types were placed in this encounter.  No orders of the defined types were placed in this encounter.    Ralph Norlander, DO [place] [phone]

## 2020-03-03 ENCOUNTER — Ambulatory Visit (INDEPENDENT_AMBULATORY_CARE_PROVIDER_SITE_OTHER): Payer: Medicare Other | Admitting: Ophthalmology

## 2020-03-03 ENCOUNTER — Encounter (INDEPENDENT_AMBULATORY_CARE_PROVIDER_SITE_OTHER): Payer: Self-pay | Admitting: Ophthalmology

## 2020-03-03 DIAGNOSIS — I10 Essential (primary) hypertension: Secondary | ICD-10-CM | POA: Diagnosis not present

## 2020-03-03 DIAGNOSIS — H35033 Hypertensive retinopathy, bilateral: Secondary | ICD-10-CM

## 2020-03-03 DIAGNOSIS — H35372 Puckering of macula, left eye: Secondary | ICD-10-CM

## 2020-03-03 DIAGNOSIS — Z9889 Other specified postprocedural states: Secondary | ICD-10-CM | POA: Diagnosis not present

## 2020-03-03 DIAGNOSIS — H25813 Combined forms of age-related cataract, bilateral: Secondary | ICD-10-CM

## 2020-03-03 DIAGNOSIS — H3581 Retinal edema: Secondary | ICD-10-CM | POA: Diagnosis not present

## 2020-03-03 NOTE — Progress Notes (Signed)
[facility] Note  03/03/2020     CHIEF COMPLAINT Patient presents for Retina Follow Up   HISTORY OF PRESENT ILLNESS: Ralph Stevens is a 69 y.o. male who presents to the clinic today for:   HPI    Retina Follow Up    Patient presents with  Other.  In left eye.  This started months ago.  Severity is mild.  Duration of 5 months.  Since onset it is stable.  I, the attending physician,  performed the HPI with the patient and updated documentation appropriately.          Comments    69 y/o male pt here for 5 mo f/u for ERM OS.  Pt was supposed to return about 1 mo ago, but f/u was lost due to pt being hospitalized for a UTI and resulting sepsis.  No change in [place] OU.  Denies pain, FOL.  Has occasional small floaters OD.  No gtts.       Last edited by Bernarda Caffey, MD on 03/03/2020  1:30 PM. (History)    pt states vision is doing well, pt missed his appt last month due to being hospitalized for sepsis from a UTI, he states he is just now starting to feel better  Referring physician: [name], [place],  [place] [address]  HISTORICAL INFORMATION:   Selected notes from the [place] [place] Referred by PCP (Dr. Lajuana Ripple) for history of retinal tear OS   CURRENT MEDICATIONS: No current outpatient medications on file. (Ophthalmic Drugs)   No current facility-administered medications for this visit. (Ophthalmic Drugs)   Current Outpatient Medications (Other)  Medication Sig  . alfuzosin (UROXATRAL) 10 MG 24 hr tablet Take 1 tablet (10 mg total) by mouth in the morning and at bedtime.  Marland Kitchen amLODipine (NORVASC) 5 MG tablet Take 1 tablet (5 mg total) by mouth daily.  Marland Kitchen aspirin 81 MG chewable tablet Chew 81 mg by mouth in the morning.   Marland Kitchen atorvastatin (LIPITOR) 10 MG tablet Take 10 mg by mouth at bedtime.   Marland Kitchen esomeprazole (NEXIUM) 20 MG capsule Take 20 mg by mouth in the morning.   . fluticasone (FLONASE) 50 MCG/ACT nasal  spray Place 1-2 sprays into both nostrils daily as needed for allergies or rhinitis.   Marland Kitchen lisinopril (ZESTRIL) 40 MG tablet Take 1 tablet (40 mg total) by mouth daily.  . metoprolol tartrate (LOPRESSOR) 50 MG tablet Take 1 tablet (50 mg total) by mouth 2 (two) times daily.   No current facility-administered medications for this visit. (Other)      REVIEW OF SYSTEMS: ROS    Positive for: Gastrointestinal, Musculoskeletal, Eyes   Negative for: Constitutional, Neurological, Skin, Genitourinary, HENT, Endocrine, Cardiovascular, Respiratory, Psychiatric, Allergic/Imm, Heme/Lymph   Last edited by Matthew Folks, COA on 03/03/2020  1:06 PM. (History)       ALLERGIES No Known Allergies  PAST MEDICAL HISTORY Past Medical History:  Diagnosis Date  . Acid reflux   . Arthritis   . Arthritis   . Cataract    Mixed form OU  . Enlarged prostate    PSA goes up and down   . GERD (gastroesophageal reflux disease)   . High cholesterol   . Hyperlipidemia   . Hypertension   . Hypertensive retinopathy    OU   Past Surgical History:  Procedure Laterality Date  . EYE SURGERY    . TONSILLECTOMY     age  36     FAMILY HISTORY Family History  Problem Relation Age of Onset  . Cancer Mother   . Colon cancer Mother   . Dementia Mother   . Alzheimer's disease Mother   . Emphysema Father   . Hypothyroidism Sister   . Thyroid disease Sister   . Hypertension Sister   . Multiple sclerosis Daughter   . Heart defect Daughter   . Cancer Maternal Grandmother        colon  . Heart disease Maternal Grandfather   . Heart disease Paternal Grandmother   . Cancer Paternal Grandfather        unknown ( mouth/throat)    SOCIAL HISTORY Social History   Tobacco Use  . Smoking status: Never Smoker  . Smokeless tobacco: Never Used  Vaping Use  . Vaping Use: Never used  Substance Use Topics  . Alcohol use: Never  . Drug use: Never         OPHTHALMIC EXAM:  Base Eye Exam    Visual Acuity  (Snellen - Linear)      Right Left   Dist cc 20/20 -2 20/15   Correction: Glasses       Tonometry (Tonopen, 1:11 PM)      Right Left   Pressure 13 13       Pupils      Dark Light Shape React APD   Right 3 2 Round Brisk None   Left 3 2 Round Brisk None       Visual Fields (Counting fingers)      Left Right    Full Full       Extraocular Movement      Right Left    Full, Ortho Full, Ortho       Neuro/Psych    Oriented x3: Yes   Mood/Affect: Normal       Dilation    Both eyes: 1.0% Mydriacyl, 2.5% Phenylephrine @ 1:11 PM        Slit Lamp and Fundus Exam    Slit Lamp Exam      Right Left   Lids/Lashes Dermatochalasis - upper lid, mild Meibomian gland dysfunction Dermatochalasis - upper lid, mild Meibomian gland dysfunction   Conjunctiva/Sclera White and quiet White and quiet   Cornea Trace Punctate epithelial erosions, mild Arcus Trace Punctate epithelial erosions, mild Arcus   Anterior Chamber Deep,clear, narrow temporal angle Deep and quiet   Iris Round and moderately dilated to 5.60mm Round and moderately dilated to 5.31mm   Lens 2-3+ Nuclear sclerosis, 2-3+ Cortical cataract 2-3+ Nuclear sclerosis, 2-3+ Cortical cataract   Vitreous Vitreous syneresis Vitreous syneresis       Fundus Exam      Right Left   Disc Pink and Sharp Pink and Sharp   C/D Ratio 0.4 0.3   Macula Flat, Good foveal reflex, Retinal pigment epithelial mottling, No heme or edema Flat, blunted foveal reflex, ERM with striae, No heme    Vessels Mild Vascular attenuation, mild Tortuousity Mild Vascular attenuation, mild Tortuousity   Periphery Attached, No heme  Attached, operculated tear at 1200 with good laser surrounding, No heme           IMAGING AND PROCEDURES  Imaging and Procedures for @TODAY @  OCT, Retina - OU - Both Eyes       Right Eye Quality was good. Central Foveal Thickness: 293. Progression has been stable. Findings include normal foveal contour, no IRF, no SRF.   Left  Eye Quality was good. Central  Foveal Thickness: 391. Progression has improved. Findings include abnormal foveal contour, epiretinal membrane, no IRF, no SRF, macular pucker (Mild interval improvement in foveal profile; persistent ERM).   Notes *Images captured and stored on drive  Diagnosis / Impression:  OD: NFP, no IRF/SRF OS: +ERM w/ pucker; abnormal foveal profile; no IRF/SRF  Clinical management:  See below  Abbreviations: NFP - Normal foveal profile. CME - cystoid macular edema. PED - pigment epithelial detachment. IRF - intraretinal fluid. SRF - subretinal fluid. EZ - ellipsoid zone. ERM - epiretinal membrane. ORA - outer retinal atrophy. ORT - outer retinal tubulation. SRHM - subretinal hyper-reflective material                 ASSESSMENT/PLAN:    ICD-10-CM   1. Epiretinal membrane (ERM) of left eye  H35.372   2. Retinal edema  H35.81 OCT, Retina - OU - Both Eyes  3. History of repair of retinal tear by laser photocoagulation  Z98.890   4. Essential hypertension  I10   5. Hypertensive retinopathy of both eyes  H35.033   6. Combined forms of age-related cataract of both eyes  H25.813     1,2. Epiretinal membrane, OS  - likely related to history of RT s/p laser retinopexy (see below)  - mild ERM w/ early pucker -- stable from prior  - asymptomatic, no metamorphopsia  - no indication for surgery at this time  - monitor for now  - f/u 9 mos, DFE, OCT  3. History of retinal tear OS  - operculated tear located at 1200  - s/p laser retinopexy (Dr. Shana Chute, [facility], [place], [place]) -- good laser surrounding  - stable  - no other RT/RD  4,5. Hypertensive retinopathy OU  - discussed importance of tight BP control  - monitor  6. Mixed form age related cataracts OU  - The symptoms of cataract, surgical options, and treatments and risks were discussed with patient.  - discussed diagnosis and progression  - not yet visually significant  - monitor for  now    Ophthalmic Meds Ordered this visit:  No orders of the defined types were placed in this encounter.      Return in about 9 months (around 12/02/2020) for f/u ERM OS, DFE, OCT.  There are no Patient Instructions on file for this visit.   Explained the diagnoses, plan, and follow up with the patient and they expressed understanding.  Patient expressed understanding of the importance of proper follow up care.   This document serves as a record of services personally performed by Gardiner Sleeper, MD, PhD. It was created on their behalf by Estill Bakes, COT an ophthalmic technician. The creation of this record is the provider's dictation and/or activities during the visit.    Electronically signed by: Estill Bakes, COT 03/03/20 @ 1:42 PM   This document serves as a record of services personally performed by Gardiner Sleeper, MD, PhD. It was created on their behalf by San Jetty. Owens Shark, OA an ophthalmic technician. The creation of this record is the provider's dictation and/or activities during the visit.    Electronically signed by: San Jetty. Owens Shark, [place] 07.13.2021 1:42 PM  Gardiner Sleeper, M.D., Ph.D. Diseases & Surgery of the Retina and [place] 03/03/2020   I have reviewed the above documentation for accuracy and completeness, and I agree with the above. Gardiner Sleeper, M.D., Ph.D. 03/03/20 1:42 PM   Abbreviations: M myopia (nearsighted); A  astigmatism; H hyperopia (farsighted); P presbyopia; Mrx spectacle prescription;  CTL contact lenses; OD right eye; OS left eye; OU both eyes  XT exotropia; ET esotropia; PEK punctate epithelial keratitis; PEE punctate epithelial erosions; DES dry eye syndrome; MGD meibomian gland dysfunction; ATs artificial tears; PFAT's preservative free artificial tears; [place] nuclear sclerotic cataract; PSC posterior subcapsular cataract; ERM epi-retinal membrane; PVD posterior vitreous detachment; RD retinal detachment; DM  diabetes mellitus; DR diabetic retinopathy; NPDR non-proliferative diabetic retinopathy; PDR proliferative diabetic retinopathy; CSME clinically significant macular edema; DME diabetic macular edema; dbh dot blot hemorrhages; CWS cotton wool spot; POAG primary open angle glaucoma; C/D cup-to-disc ratio; HVF humphrey visual field; GVF goldmann visual field; OCT optical coherence tomography; IOP intraocular pressure; BRVO Branch retinal vein occlusion; CRVO central retinal vein occlusion; CRAO central retinal artery occlusion; BRAO branch retinal artery occlusion; RT retinal tear; SB scleral buckle; PPV pars plana vitrectomy; VH Vitreous hemorrhage; PRP panretinal laser photocoagulation; IVK intravitreal kenalog; VMT vitreomacular traction; MH Macular hole;  NVD neovascularization of the disc; NVE neovascularization elsewhere; AREDS age related eye disease study; ARMD age related macular degeneration; POAG primary open angle glaucoma; EBMD epithelial/anterior basement membrane dystrophy; ACIOL anterior chamber intraocular lens; IOL intraocular lens; PCIOL posterior chamber intraocular lens; Phaco/IOL phacoemulsification with intraocular lens placement; [place] photorefractive keratectomy; LASIK laser assisted in situ keratomileusis; HTN hypertension; DM diabetes mellitus; COPD chronic obstructive pulmonary disease

## 2020-03-10 DIAGNOSIS — L570 Actinic keratosis: Secondary | ICD-10-CM | POA: Diagnosis not present

## 2020-03-10 DIAGNOSIS — X32XXXD Exposure to sunlight, subsequent encounter: Secondary | ICD-10-CM | POA: Diagnosis not present

## 2020-03-24 ENCOUNTER — Ambulatory Visit: Payer: Medicare Other | Admitting: Urology

## 2020-05-12 ENCOUNTER — Other Ambulatory Visit: Payer: Self-pay | Admitting: Family Medicine

## 2020-05-12 DIAGNOSIS — I10 Essential (primary) hypertension: Secondary | ICD-10-CM

## 2020-05-28 ENCOUNTER — Other Ambulatory Visit: Payer: Medicare Other

## 2020-05-28 ENCOUNTER — Other Ambulatory Visit: Payer: Self-pay

## 2020-05-28 DIAGNOSIS — Z789 Other specified health status: Secondary | ICD-10-CM | POA: Diagnosis not present

## 2020-05-28 DIAGNOSIS — N401 Enlarged prostate with lower urinary tract symptoms: Secondary | ICD-10-CM

## 2020-05-28 DIAGNOSIS — I10 Essential (primary) hypertension: Secondary | ICD-10-CM

## 2020-05-28 DIAGNOSIS — R350 Frequency of micturition: Secondary | ICD-10-CM | POA: Diagnosis not present

## 2020-05-28 NOTE — Addendum Note (Signed)
Addended by: Earlene Plater on: 05/28/2020 11:15 AM   Modules accepted: Orders

## 2020-05-29 LAB — HIV ANTIBODY (ROUTINE TESTING W REFLEX)

## 2020-05-29 LAB — PSA: Prostate Specific Ag, Serum: 3 ng/mL (ref 0.0–4.0)

## 2020-06-01 LAB — CMP14+EGFR
ALT: 15 IU/L (ref 0–44)
AST: 14 IU/L (ref 0–40)
Albumin/Globulin Ratio: 1.6 (ref 1.2–2.2)
Albumin: 4.2 g/dL (ref 3.8–4.8)
Alkaline Phosphatase: 74 IU/L (ref 44–121)
BUN/Creatinine Ratio: 10 (ref 10–24)
BUN: 9 mg/dL (ref 8–27)
Bilirubin Total: 0.6 mg/dL (ref 0.0–1.2)
CO2: 23 mmol/L (ref 20–29)
Calcium: 8.8 mg/dL (ref 8.6–10.2)
Chloride: 107 mmol/L — ABNORMAL HIGH (ref 96–106)
Creatinine, Ser: 0.92 mg/dL (ref 0.76–1.27)
GFR calc Af Amer: 98 mL/min/{1.73_m2} (ref >59)
GFR calc non Af Amer: 85 mL/min/{1.73_m2} (ref >59)
Globulin, Total: 2.6 g/dL (ref 1.5–4.5)
Glucose: 152 mg/dL — ABNORMAL HIGH (ref 65–99)
Potassium: 3.8 mmol/L (ref 3.5–5.2)
Sodium: 143 mmol/L (ref 134–144)
Total Protein: 6.8 g/dL (ref 6.0–8.5)

## 2020-06-01 LAB — RNA QUALITATIVE: HIV 1 RNA Qualitative: NEGATIVE

## 2020-06-01 LAB — HIV 1/2 AB DIFFERENTIATION
HIV 1 Ab: NEGATIVE
HIV 2 Ab: NEGATIVE
NOTE (HIV CONF MULTIP: NEGATIVE

## 2020-06-01 LAB — HIV ANTIBODY (ROUTINE TESTING W REFLEX): HIV Screen 4th Generation wRfx: REACTIVE — AB

## 2020-06-01 LAB — LIPID PANEL
Chol/HDL Ratio: 5.4 ratio — ABNORMAL HIGH (ref 0.0–5.0)
Cholesterol, Total: 168 mg/dL (ref 100–199)
HDL: 31 mg/dL — ABNORMAL LOW (ref >39)
LDL Chol Calc (NIH): 105 mg/dL — ABNORMAL HIGH (ref 0–99)
Triglycerides: 181 mg/dL — ABNORMAL HIGH (ref 0–149)
VLDL Cholesterol Cal: 32 mg/dL (ref 5–40)

## 2020-06-09 NOTE — Progress Notes (Signed)
Results sent via my chart 

## 2020-07-28 ENCOUNTER — Encounter: Payer: Self-pay | Admitting: Urology

## 2020-07-28 ENCOUNTER — Ambulatory Visit (INDEPENDENT_AMBULATORY_CARE_PROVIDER_SITE_OTHER): Payer: Medicare Other | Admitting: Urology

## 2020-07-28 ENCOUNTER — Other Ambulatory Visit: Payer: Self-pay

## 2020-07-28 VITALS — BP 165/93 | HR 93 | Temp 97.7°F | Wt 203.0 lb

## 2020-07-28 DIAGNOSIS — N401 Enlarged prostate with lower urinary tract symptoms: Secondary | ICD-10-CM | POA: Diagnosis not present

## 2020-07-28 DIAGNOSIS — R350 Frequency of micturition: Secondary | ICD-10-CM | POA: Diagnosis not present

## 2020-07-28 LAB — BLADDER SCAN AMB NON-IMAGING: Scan Result: 58

## 2020-07-28 NOTE — Progress Notes (Signed)
H&P  Chief Complaint: BPH & Elevated PSA  History of Present Illness:   12.7.2021: Pt here after septic bacterial infection. He notes that he has lingering fatigue but is otherwise recovering well. He continues on alfuzosin but notes significant urinary frequency during the day. He notes a strong FOS with a full bladder but notes that the force diminishes while emptying his bladder. He reports that he is able to suppress urinary urge without issue and denies any recent foul-smelling urine or dysuria.  PSA: 3.0 (10.7.2021)  IPSS Questionnaire (AUA-7): Over the past month.   1)  How often have you had a sensation of not emptying your bladder completely after you finish urinating?  1 - Less than 1 time in 5  2)  How often have you had to urinate again less than two hours after you finished urinating? 4 - More than half the time  3)  How often have you found you stopped and started again several times when you urinated?  1 - Less than 1 time in 5  4) How difficult have you found it to postpone urination?  0 - Not at all  5) How often have you had a weak urinary stream?  3 - About half the time  6) How often have you had to push or strain to begin urination?  1 - Less than 1 time in 5  7) How many times did you most typically get up to urinate from the time you went to bed until the time you got up in the morning?  1 - 1 time  Total score:  0-7 mildly symptomatic   8-19 moderately symptomatic   20-35 severely symptomatic  QoL Score: 3   (below copied from [facility] records):  2.2.2021: Here today referred by Dr Lajuana Ripple for follow-up on his elevated PSA and 'abnormally shaped' prostate w/ mild LUTS. He is currently on tamsulosin -- previously on dual medical therapy w/ alfuzosin but stopped because he was having issues with it in combination w/ HCTZ (peeing too frequently). His PSA has apparently fluctuated over the last few years (at one point he thinks it was up to 6, but it came back down to  what he says was normal for him), but he does not have any old records for these data.   6.8.2021: Here for follow-up after catheter removal 2 wks prior. He has been on alfuzosin 2x daily since last visit and feels as though his urinary pattern and sx's are significantly improved. He still thinks his stream is weaker than before his recently retentive episode but feels as though this is still improving. He does have some dizziness. Of note, he received both doses of the COVID vaccine and wanted this entered into his chart so he stops getting notified about it.   Past Medical History:  Diagnosis Date  . Acid reflux   . Arthritis   . Arthritis   . Cataract    Mixed form OU  . Enlarged prostate    PSA goes up and down   . GERD (gastroesophageal reflux disease)   . High cholesterol   . Hyperlipidemia   . Hypertension   . Hypertensive retinopathy    OU    Past Surgical History:  Procedure Laterality Date  . EYE SURGERY    . TONSILLECTOMY     age 69     Home Medications:  Allergies as of 07/28/2020   No Known Allergies     Medication List  Accurate as of July 28, 2020  9:21 AM. If you have any questions, ask your nurse or doctor.        alfuzosin 10 MG 24 hr tablet Commonly known as: UROXATRAL Take 1 tablet (10 mg total) by mouth in the morning and at bedtime.   amLODipine 5 MG tablet Commonly known as: NORVASC Take 1 tablet (5 mg total) by mouth daily.   aspirin 81 MG chewable tablet Chew 81 mg by mouth in the morning.   atorvastatin 10 MG tablet Commonly known as: LIPITOR Take 10 mg by mouth at bedtime.   esomeprazole 20 MG capsule Commonly known as: NEXIUM Take 20 mg by mouth in the morning.   fluticasone 50 MCG/ACT nasal spray Commonly known as: FLONASE Place 1-2 sprays into both nostrils daily as needed for allergies or rhinitis.   lisinopril 40 MG tablet Commonly known as: ZESTRIL Take 1 tablet (40 mg total) by mouth daily.   metoprolol  tartrate 50 MG tablet Commonly known as: LOPRESSOR Take 1 tablet (50 mg total) by mouth 2 (two) times daily.       Allergies: No Known Allergies  Family History  Problem Relation Age of Onset  . Cancer Mother   . Colon cancer Mother   . Dementia Mother   . Alzheimer's disease Mother   . Emphysema Father   . Hypothyroidism Sister   . Thyroid disease Sister   . Hypertension Sister   . Multiple sclerosis Daughter   . Heart defect Daughter   . Cancer Maternal Grandmother        colon  . Heart disease Maternal Grandfather   . Heart disease Paternal Grandmother   . Cancer Paternal Grandfather        unknown ( mouth/throat)    Social History:  reports that he has never smoked. He has never used smokeless tobacco. He reports that he does not drink alcohol and does not use drugs.  ROS: A complete review of systems was performed.  All systems are negative except for pertinent findings as noted.  Physical Exam:  Vital signs in last 24 hours: There were no vitals taken for this visit. Constitutional:  Alert and oriented, No acute distress Respiratory: Normal respiratory effort Neurologic: Grossly intact, no focal deficits Psychiatric: Normal mood and affect  Laboratory Data:  No results for input(s): WBC, HGB, HCT, PLT in the last 72 hours.  No results for input(s): NA, K, CL, GLUCOSE, BUN, CALCIUM, CREATININE in the last 72 hours.  Invalid input(s): CO3   No results found for this or any previous visit (from the past 24 hour(s)). No results found for this or any previous visit (from the past 240 hour(s)).  Renal Function: No results for input(s): CREATININE in the last 168 hours. CrCl cannot be calculated (Patient's most recent lab result is older than the maximum 21 days allowed.).  Radiologic Imaging: No results found.  Impression/Assessment:  PSA: Pt trend is stable and presents no concern at this time.  BPH: Pt stable  OAB: PVR shows pt is able to empty his  bladder completely and is urinary symptoms are likely caused by an overactive bladder.  Plan:  1. Pt provided w/ oriented to OAB worksheet for self-help measures to reduce his urinary symptoms  2. F/U in one year for OV and symptom recheck.  CC: Dr. Ronnie Doss

## 2020-07-28 NOTE — Progress Notes (Signed)
Bladder Scan Patient cannot void: 58 ml Performed By: Hope,LPN    Urological Symptom Review  Patient is experiencing the following symptoms: Frequent urination Get up at night to urinate Weak stream Erection problems (male only)   Review of Systems  Gastrointestinal (upper)  : Negative for upper GI symptoms  Gastrointestinal (lower) : Constipation  Constitutional : Negative for symptoms  Skin: Negative for skin symptoms  Eyes: Negative for eye symptoms  Ear/Nose/Throat : Negative for Ear/Nose/Throat symptoms  Hematologic/Lymphatic: negative  Cardiovascular : Negative for cardiovascular symptoms  Respiratory : Negative for respiratory symptoms  Endocrine: Negative for endocrine symptoms  Musculoskeletal: Negative for musculoskeletal symptoms  Neurological: Negative for neurological symptoms  Psychologic: Negative for psychiatric symptoms

## 2020-08-24 ENCOUNTER — Other Ambulatory Visit: Payer: Self-pay | Admitting: Family Medicine

## 2020-08-24 DIAGNOSIS — I10 Essential (primary) hypertension: Secondary | ICD-10-CM

## 2020-09-02 ENCOUNTER — Ambulatory Visit (INDEPENDENT_AMBULATORY_CARE_PROVIDER_SITE_OTHER): Payer: Medicare Other | Admitting: Family Medicine

## 2020-09-02 ENCOUNTER — Encounter: Payer: Self-pay | Admitting: Family Medicine

## 2020-09-02 ENCOUNTER — Other Ambulatory Visit: Payer: Self-pay

## 2020-09-02 VITALS — BP 152/81 | HR 74 | Temp 97.4°F | Ht 72.0 in | Wt 199.0 lb

## 2020-09-02 DIAGNOSIS — I10 Essential (primary) hypertension: Secondary | ICD-10-CM | POA: Diagnosis not present

## 2020-09-02 DIAGNOSIS — K449 Diaphragmatic hernia without obstruction or gangrene: Secondary | ICD-10-CM

## 2020-09-02 DIAGNOSIS — E782 Mixed hyperlipidemia: Secondary | ICD-10-CM | POA: Diagnosis not present

## 2020-09-02 DIAGNOSIS — K649 Unspecified hemorrhoids: Secondary | ICD-10-CM

## 2020-09-02 DIAGNOSIS — R739 Hyperglycemia, unspecified: Secondary | ICD-10-CM | POA: Diagnosis not present

## 2020-09-02 MED ORDER — FLUTICASONE PROPIONATE 50 MCG/ACT NA SUSP: 1.0000 | Freq: Every day | NASAL | 3 refills | Status: DC | PRN

## 2020-09-02 MED ORDER — ATORVASTATIN CALCIUM 20 MG PO TABS
20.0000 mg | ORAL_TABLET | Freq: Every day | ORAL | 3 refills | Status: DC
Start: 2020-09-02 — End: 2021-03-03

## 2020-09-02 NOTE — Progress Notes (Signed)
Subjective: CC: Follow-up hypertension, hyperlipidemia PCP: [name], DO MHW:KGSUP Ralph Stevens is a 70 y.o. male presenting to clinic today for:  1.  Hypertension with hyperlipidemia; Hiatal hernia, hemorrhoids Patient is compliant with Norvasc 5 mg daily, Zestril 40 mg daily and Lopressor 50 mg.  He also takes Lipitor 10 mg currently for cholesterol.  His cholesterol panel was noted to be elevated last visit but unfortunately he was never aware of need to advance the Lipitor to 20 mg daily.  He is willing to do this however.  Denies any chest pain, shortness of breath, dizziness or falls.  He does note some chest pressure and fullness that is most prominent when he bends forward.  He is currently on Nexium 20 mg daily for acid reflux and has a known hiatal hernia.  He denies any issues with nausea, vomiting, decreased p.o. intake.  He also admits to bleeding hemorrhoids that occur up to 1 week at a time but are random.  He notes that the seem to be most prominent when he is not straining.  He reports fairly regular bowel movements.  Denies any unplanned weight loss.  Last colonoscopy was 2 years ago in [facility].  He has history of polyps.  He was told to follow-up in 5 years.   ROS: Per HPI  No Known Allergies Past Medical History:  Diagnosis Date  . Acid reflux   . Arthritis   . Arthritis   . Cataract    Mixed form OU  . Enlarged prostate    PSA goes up and down   . GERD (gastroesophageal reflux disease)   . High cholesterol   . Hyperlipidemia   . Hypertension   . Hypertensive retinopathy    OU    Current Outpatient Medications:  .  alfuzosin (UROXATRAL) 10 MG 24 hr tablet, Take 10 mg by mouth daily with breakfast., Disp: , Rfl:  .  amLODipine (NORVASC) 5 MG tablet, TAKE 1 TABLET BY MOUTH EVERY DAY, Disp: 90 tablet, Rfl: 3 .  aspirin 81 MG chewable tablet, Chew 81 mg by mouth in the morning. , Disp: , Rfl:  .  atorvastatin (LIPITOR) 10 MG tablet, Take  10 mg by mouth at bedtime. , Disp: , Rfl:  .  esomeprazole (NEXIUM) 20 MG capsule, Take 20 mg by mouth in the morning. , Disp: , Rfl:  .  fluticasone (FLONASE) 50 MCG/ACT nasal spray, Place 1-2 sprays into both nostrils daily as needed for allergies or rhinitis. , Disp: , Rfl:  .  lisinopril (ZESTRIL) 40 MG tablet, Take 1 tablet (40 mg total) by mouth daily., Disp: 90 tablet, Rfl: 3 .  metoprolol tartrate (LOPRESSOR) 50 MG tablet, Take 1 tablet (50 mg total) by mouth 2 (two) times daily., Disp: 180 tablet, Rfl: 3 Social History   Socioeconomic History  . Marital status: Married    Spouse name: [name]  . Number of children: 3  . Years of education: Not on file  . Highest education level: Not on file  Occupational History    Comment: semi-retired   Tobacco Use  . Smoking status: Never Smoker  . Smokeless tobacco: Never Used  Vaping Use  . Vaping Use: Never used  Substance and Sexual Activity  . Alcohol use: Never  . Drug use: Never  . Sexual activity: Not on file  Other Topics Concern  . 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  He currently is unemployed but is actively looking for work as he enjoys being busy.   He recently relocated from [place] and resides locally with his wife.  He has a son that lives in [place] but his other 2 daughters are residing in [place].   Social Determinants of Health   Financial Resource Strain: Not on file  Food Insecurity: Not on file  Transportation Needs: Not on file  Physical Activity: Not on file  Stress: Not on file  Social Connections: Not on file  Intimate Partner Violence: Not on file   Family History  Problem Relation Age of Onset  . Cancer Mother   . Colon cancer Mother   . Dementia Mother   . Alzheimer's disease Mother   . Emphysema Father   . Hypothyroidism Sister   . Thyroid disease Sister   . Hypertension Sister   . Multiple sclerosis  Daughter   . Heart defect Daughter   . Cancer Maternal Grandmother        colon  . Heart disease Maternal Grandfather   . Heart disease Paternal Grandmother   . Cancer Paternal Grandfather        unknown ( mouth/throat)    Objective: Office vital signs reviewed. BP (!) 152/81   Pulse 74   Temp (!) 97.4 F (36.3 C) (Temporal)   Ht 6' (1.829 m)   Wt 199 lb (90.3 kg)   SpO2 97%   BMI 26.99 kg/m   Physical Examination:  General: Awake, alert, well nourished, No acute distress HEENT: Normal, sclera white, MMM Cardio: regular rate and rhythm, S1S2 heard, no murmurs appreciated Pulm: clear to auscultation bilaterally, no wheezes, rhonchi or rales; normal work of breathing on room air GI: soft, non-tender, non-distended, bowel sounds present x4, no hepatomegaly, no splenomegaly, no masses  Assessment/ Plan: 70 y.o. male   Essential hypertension  Mixed hyperlipidemia - Plan: CMP14+EGFR, Lipid panel, CANCELED: Lipid Panel  Elevated serum glucose - Plan: Bayer DCA Hb A1c Waived, CANCELED: Bayer DCA Hb A1c Waived  Bleeding hemorrhoids - Plan: Ambulatory referral to Gastroenterology  Hiatal hernia - Plan: Ambulatory referral to Gastroenterology  Blood pressure is not at goal but I do question whitecoat hypertension as his home blood pressures are normal.  I have asked that he bring his blood pressure cuff in for evaluation against ours.  If blood pressure is equivalent, whitecoat hypertension is likely the cause of elevated blood pressures.  If there is a large discrepancy between the blood pressures, he needs to get a new blood pressure cuff and we will plan to increase his amlodipine to 10 mg  He will come back in for fasting labs in about 3 months.  His Lipitor was increased to 20 mg daily.  He is aware of the new dose  For his bleeding hemorrhoids and hiatal hernia and placing referral to gastroenterology locally.  He was previously evaluated 2 years ago by GI in [facility].  Has not yet establish care with new provider locally.  Continue sitz bath's, Tucks pads, Preparation H as needed.  I suspect that the hemorrhoids are internal since he is not having any pain.    No orders of the defined types were placed in this encounter.  No orders of the defined types were placed in this encounter.    [name], DO [place] [phone]

## 2020-09-02 NOTE — Patient Instructions (Signed)
Bring your blood pressure cuff in.  I would like to make sure that we are getting the same readings on your blood pressure cuff as ours.  If blood pressure continues to be above goal we will increase your amlodipine.  However, I suspect that there is a component of whitecoat hypertension   I have increased your Lipitor to 20 mg daily.  New Rx sent  Referral placed to gastroenterology for both hemorrhoids and hiatal hernia   Hiatal Hernia  A hiatal hernia occurs when part of the stomach slides above the muscle that separates the abdomen from the chest (diaphragm). A person can be born with a hiatal hernia (congenital), or it may develop over time. In almost all cases of hiatal hernia, only the top part of the stomach pushes through the diaphragm. Many people have a hiatal hernia with no symptoms. The larger the hernia, the more likely it is that you will have symptoms. In some cases, a hiatal hernia allows stomach acid to flow back into the tube that carries food from your mouth to your stomach (esophagus). This may cause heartburn symptoms. Severe heartburn symptoms may mean that you have developed a condition called gastroesophageal reflux disease (GERD). What are the causes? This condition is caused by a weakness in the opening (hiatus) where the esophagus passes through the diaphragm to attach to the upper part of the stomach. A person may be born with a weakness in the hiatus, or a weakness can develop over time. What increases the risk? This condition is more likely to develop in:  Older people. Age is a major risk factor for a hiatal hernia, especially if you are over the age of 67.  Pregnant women.  People who are overweight.  People who have frequent constipation. What are the signs or symptoms? Symptoms of this condition usually develop in the form of GERD symptoms. Symptoms include:  Heartburn.  Belching.  Indigestion.  Trouble swallowing.  Coughing or wheezing.  Sore  throat.  Hoarseness.  Chest pain.  Nausea and vomiting. How is this diagnosed? This condition may be diagnosed during testing for GERD. Tests that may be done include:  X-rays of your stomach or chest.  An upper gastrointestinal (GI) series. This is an X-ray exam of your GI tract that is taken after you swallow a chalky liquid that shows up clearly on the X-ray.  Endoscopy. This is a procedure to look into your stomach using a thin, flexible tube that has a tiny camera and light on the end of it. How is this treated? This condition may be treated by:  Dietary and lifestyle changes to help reduce GERD symptoms.  Medicines. These may include: ? Over-the-counter antacids. ? Medicines that make your stomach empty more quickly. ? Medicines that block the production of stomach acid (H2 blockers). ? Stronger medicines to reduce stomach acid (proton pump inhibitors).  Surgery to repair the hernia, if other treatments are not helping. If you have no symptoms, you may not need treatment. Follow these instructions at home: Lifestyle and activity  Do not use any products that contain nicotine or tobacco, such as cigarettes and e-cigarettes. If you need help quitting, ask your health care provider.  Try to achieve and maintain a healthy body weight.  Avoid putting pressure on your abdomen. Anything that puts pressure on your abdomen increases the amount of acid that may be pushed up into your esophagus. ? Avoid bending over, especially after eating. ? Raise the head of your  bed by putting blocks under the legs. This keeps your head and esophagus higher than your stomach. ? Do not wear tight clothing around your chest or stomach. ? Try not to strain when having a bowel movement, when urinating, or when lifting heavy objects. Eating and drinking  Avoid foods that can worsen GERD symptoms. These may include: ? Fatty foods, like fried foods. ? Citrus fruits, like oranges or lemon. ? Other  foods and drinks that contain acid, like orange juice or tomatoes. ? Spicy food. ? Chocolate.  Eat frequent small meals instead of three large meals a day. This helps prevent your stomach from getting too full. ? Eat slowly. ? Do not lie down right after eating. ? Do not eat 1-2 hours before bed.  Do not drink beverages with caffeine. These include cola, coffee, cocoa, and tea.  Do not drink alcohol. General instructions  Take over-the-counter and prescription medicines only as told by your health care provider.  Keep all follow-up visits as told by your health care provider. This is important. Contact a health care provider if:  Your symptoms are not controlled with medicines or lifestyle changes.  You are having trouble swallowing.  You have coughing or wheezing that will not go away. Get help right away if:  Your pain is getting worse.  Your pain spreads to your arms, neck, jaw, teeth, or back.  You have shortness of breath.  You sweat for no reason.  You feel sick to your stomach (nauseous) or you vomit.  You vomit blood.  You have bright red blood in your stools.  You have black, tarry stools. This information is not intended to replace advice given to you by your health care provider. Make sure you discuss any questions you have with your health care provider. Document Revised: 07/21/2017 Document Reviewed: 03/13/2017 Elsevier Patient Education  [place].

## 2020-09-09 ENCOUNTER — Other Ambulatory Visit: Payer: Self-pay

## 2020-09-09 ENCOUNTER — Encounter: Payer: Self-pay | Admitting: Family Medicine

## 2020-09-09 ENCOUNTER — Ambulatory Visit: Payer: Medicare Other | Admitting: *Deleted

## 2020-09-09 VITALS — BP 145/86 | HR 84

## 2020-09-09 DIAGNOSIS — I10 Essential (primary) hypertension: Secondary | ICD-10-CM

## 2020-09-09 NOTE — Progress Notes (Signed)
Home blood pressures are appropriate.  It looks like his cuff is reliable.  No medication changes are needed at this time.  It appears that elevation of blood pressure seem to be whitecoat/situationally induced.  Jahkari Maclin M. Lajuana Ripple, [facility]

## 2020-09-09 NOTE — Progress Notes (Signed)
Patient in for 1 week BP Check  Office Cuff  145/86  84 Home Cuff 147/85   Daily Home readings for 1 week 1/13     132/78 1/14     138/90  137/83 1/15     136/76 1/16     129/79  122/79 1/17     121/78 1/18     116/72 1/19     130/82

## 2020-09-21 ENCOUNTER — Ambulatory Visit (INDEPENDENT_AMBULATORY_CARE_PROVIDER_SITE_OTHER): Payer: Medicare Other

## 2020-09-21 DIAGNOSIS — Z0000 Encounter for general adult medical examination without abnormal findings: Secondary | ICD-10-CM

## 2020-09-21 NOTE — Progress Notes (Signed)
MEDICARE ANNUAL WELLNESS VISIT  09/21/2020  Telephone Visit Disclaimer This Medicare AWV was conducted by telephone due to national recommendations for restrictions regarding the COVID-19 Pandemic (e.g. social distancing).  I verified, using two identifiers, that I am speaking with Ralph Stevens or their authorized healthcare agent. I discussed the limitations, risks, security, and privacy concerns of performing an evaluation and management service by telephone and the potential availability of an in-person appointment in the future. The patient expressed understanding and agreed to proceed.  Location of Patient: Home Location of Provider (nurse):  [facility]  Subjective:    Ralph Stevens is a 70 y.o. male patient of Ralph Norlander, DO who had a Medicare Annual Wellness Visit today via telephone. Ralph Stevens and lives with their spouse. He has three children and five grandchildren. He reports that he is socially active and does interact with friends/family regularly. He is moderately physically active and enjoys fishing watching nascar races and attending baseball games.  Patient Care Team: Ralph Norlander, DO as PCP - General (Family Medicine) Ralph Gallo, MD as Consulting Physician (Urology) Ralph Caffey, MD as Consulting Physician (Ophthalmology) Ralph Kenner, MD (Dermatology) Ralph Romney Cristopher Estimable, MD as Consulting Physician (Gastroenterology)  Advanced Directives 09/21/2020 01/01/2020 09/20/2019  Does Patient Have a Medical Advance Directive? No No No  Would patient like information on creating a medical advance directive? No - Patient declined No - Patient declined No - Patient declined    Hospital Utilization Over the Past 12 Months: # of hospitalizations or ER visits: 1 # of surgeries: 0  Review of Systems    Patient reports that his overall health is unchanged compared to last year.  History obtained from chart review and the patient  Patient  Reported Readings (BP, Pulse, CBG, Weight, etc) none  Pain Assessment Pain : No/denies pain     Current Medications & Allergies (verified) Allergies as of 09/21/2020   No Known Allergies     Medication List       Accurate as of September 21, 2020 10:33 AM. If you have any questions, ask your nurse or doctor.        alfuzosin 10 MG 24 hr tablet Commonly known as: UROXATRAL Take 10 mg by mouth daily with breakfast.   amLODipine 5 MG tablet Commonly known as: NORVASC TAKE 1 TABLET BY MOUTH EVERY DAY   aspirin 81 MG chewable tablet Chew 81 mg by mouth in the morning.   atorvastatin 20 MG tablet Commonly known as: LIPITOR Take 1 tablet (20 mg total) by mouth daily.   esomeprazole 20 MG capsule Commonly known as: NEXIUM Take 20 mg by mouth in the morning.   fluticasone 50 MCG/ACT nasal spray Commonly known as: FLONASE Place 1-2 sprays into both nostrils daily as needed for allergies or rhinitis.   lisinopril 40 MG tablet Commonly known as: ZESTRIL Take 1 tablet (40 mg total) by mouth daily.   metoprolol tartrate 50 MG tablet Commonly known as: LOPRESSOR Take 1 tablet (50 mg total) by mouth 2 (two) times daily.       History (reviewed): Past Medical History:  Diagnosis Date  . Acid reflux   . Arthritis   . Arthritis   . Cataract    Mixed form OU  . Enlarged prostate    PSA goes up and down   . GERD (gastroesophageal reflux disease)   . High cholesterol   . Hyperlipidemia   . Hypertension   . Hypertensive retinopathy  OU   Past Surgical History:  Procedure Laterality Date  . EYE SURGERY    . TONSILLECTOMY     age 70    Family History  Problem Relation Age of Onset  . Cancer Mother   . Colon cancer Mother   . Dementia Mother   . Alzheimer's disease Mother   . Emphysema Father   . Hypothyroidism Sister   . Thyroid disease Sister   . Hypertension Sister   . Multiple sclerosis Daughter   . Heart defect Daughter   . Cancer Maternal  Grandmother        colon  . Heart disease Maternal Grandfather   . Heart disease Paternal Grandmother   . Cancer Paternal Grandfather        unknown ( mouth/throat)   Social History   Socioeconomic History  . Marital status: Married    Spouse name: Kathie RhodesBetty  . Number of children: 3  . Years of education: Not on file  . Highest education level: Not on file  Occupational History    Comment: semi-Stevens   Tobacco Use  . Smoking status: Never Smoker  . Smokeless tobacco: Never Used  Vaping Use  . Vaping Use: Never used  Substance and Sexual Activity  . Alcohol use: Never  . Drug use: Never  . Sexual activity: Not on file  Other Topics Concern  . 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  He currently is unemployed but is actively looking for work as he enjoys being busy.   He recently relocated from [place] and resides locally with his wife.  He has a son that lives in [place] but his other 2 daughters are residing in [place].   Social Determinants of Health   Financial Resource Strain: Not on file  Food Insecurity: Not on file  Transportation Needs: Not on file  Physical Activity: Not on file  Stress: Not on file  Social Connections: Not on file    Activities of Daily Living In your present state of health, do you have any difficulty performing the following activities: 09/21/2020 01/02/2020  Hearing? Y N  Vision? N N  Difficulty concentrating or making decisions? N N  Walking or climbing stairs? N N  Dressing or bathing? N N  Doing errands, shopping? N N  Preparing Food and eating ? N -  Using the Toilet? N -  In the past six months, have you accidently leaked urine? N -  Do you have problems with loss of bowel control? N -  Managing your Medications? N -  Managing your Finances? N -  Housekeeping or managing your Housekeeping? N -  Some recent data might be hidden  Patient reports  that he did have problems with his hearing but that has improved since he started wearing hearing aids.  Patient Education/ Literacy How often do you need to have someone help you when you read instructions, pamphlets, or other written materials from your doctor or pharmacy?: 1 - Never What is the last grade level you completed in school?: 12th grade  Exercise Current Exercise Habits: Home exercise routine, Type of exercise: walking, Time (Minutes): 20, Intensity: Mild, Exercise limited by: None identified  Diet Patient reports consuming 2 meals a day and 1 snack(s) a day Patient reports that his primary diet is: Regular Patient reports that he does have regular access to food.   Depression Screen  PHQ 2/9 Scores 09/02/2020 03/02/2020 02/18/2020 01/10/2020 09/20/2019 09/02/2019 08/07/2019  PHQ - 2 Score 0 0 0 0 0 0 0  PHQ- 9 Score 0 0 - - - 0 0     Fall Risk Fall Risk  09/21/2020 09/02/2020 03/02/2020 02/18/2020 01/10/2020  Falls in the past year? 0 0 0 0 0  Follow up Falls evaluation completed - - - -     Objective:  Ralph Stevens seemed alert and oriented and he participated appropriately during our telephone visit.  Blood Pressure Weight BMI  BP Readings from Last 3 Encounters:  09/09/20 (!) 145/86  09/02/20 (!) 152/81  07/28/20 (!) 165/93   Wt Readings from Last 3 Encounters:  09/02/20 199 lb (90.3 kg)  07/28/20 203 lb (92.1 kg)  03/02/20 201 lb (91.2 kg)   BMI Readings from Last 1 Encounters:  09/02/20 26.99 kg/m    *Unable to obtain current vital signs, weight, and BMI due to telephone visit type  Hearing/Vision  . [place] did not seem to have difficulty with hearing/understanding during the telephone conversation . Reports that he has not had a formal eye exam by an eye care professional within the past year . Reports that he has not had a formal hearing evaluation within the past year *Unable to fully assess hearing and vision during telephone visit type  Cognitive  Function: 6CIT Screen 09/21/2020 09/20/2019  What Year? 0 points 0 points  What month? 0 points 0 points  What time? 0 points 0 points  Count back from 20 0 points 0 points  Months in reverse 0 points 0 points  Repeat phrase 0 points 2 points  Total Score 0 2   (Normal:0-7, Significant for Dysfunction: >8)  Normal Cognitive Function Screening: Yes   Immunization & Health Maintenance Record Immunization History  Administered Date(s) Administered  . Moderna Sars-Covid-2 Vaccination 10/30/2019, 11/27/2019, 08/18/2020    Health Maintenance  Topic Date Due  . Hepatitis C Screening  Never done  . INFLUENZA VACCINE  11/19/2020 (Originally 03/22/2020)  . TETANUS/TDAP  09/02/2021 (Originally 11/06/1969)  . PNA vac Low Risk Adult (1 of 2 - PCV13) 09/02/2021 (Originally 11/05/2016)  . COVID-19 Vaccine (4 - Booster for Moderna series) 02/16/2021  . COLONOSCOPY (Pts 45-25yrs Insurance coverage will need to be confirmed)  12/14/2027       Assessment  This is a routine wellness examination for [place]on Novosel.  Health Maintenance: Due or Overdue Health Maintenance Due  Topic Date Due  . Hepatitis C Screening  Never done    Ralph Stevens does not need a referral for Community Assistance: Care Management:   no Social Work:    no Prescription Assistance:  no Nutrition/Diabetes Education:  no   Plan:  Personalized Goals Goals Addressed            This Visit's Progress   . Patient Stated       09/21/2020 AWV Goal: Fall Prevention  . Over the next year, patient will decrease their risk for falls by: o Using assistive devices, such as a cane or walker, as needed o Identifying fall risks within their home and correcting them by: - Removing throw rugs - Adding handrails to stairs or ramps - Removing clutter and keeping a clear pathway throughout the home - Increasing light, especially at night - Adding shower handles/bars - Raising toilet seat o Identifying potential  personal risk factors for falls: - Medication side effects - Incontinence/urgency - Vestibular dysfunction - Hearing loss -  Musculoskeletal disorders - Neurological disorders - Orthostatic hypotension        Personalized Health Maintenance & Screening Recommendations  Pneumococcal vaccine  Influenza vaccine Td vaccine  Lung Cancer Screening Recommended: no (Low Dose CT Chest recommended if Age 75-80 years, 30 pack-year currently smoking OR have quit w/in past 15 years) Hepatitis C Screening recommended: yes HIV Screening recommended: no  Advanced Directives: Written information was not prepared per patient's request.  Referrals & Orders No orders of the defined types were placed in this encounter.   Follow-up Plan . Follow-up with Ralph Norlander, DO as planned     I have personally reviewed and noted the following in the patient's chart:   . Medical and social history . Use of alcohol, tobacco or illicit drugs  . Current medications and supplements . Functional ability and status . Nutritional status . Physical activity . Advanced directives . List of other physicians . Hospitalizations, surgeries, and ER visits in previous 12 months . Vitals . Screenings to include cognitive, depression, and falls . Referrals and appointments  In addition, I have reviewed and discussed with Ralph Stevens certain preventive protocols, quality metrics, and best practice recommendations. A written personalized care plan for preventive services as well as general preventive health recommendations is available and can be mailed to the patient at his request.      Felicity Coyer, LPN    [id number]   Patient declined after visit summary

## 2020-10-22 ENCOUNTER — Other Ambulatory Visit: Payer: Self-pay

## 2020-10-22 ENCOUNTER — Encounter: Payer: Self-pay | Admitting: Nurse Practitioner

## 2020-10-22 ENCOUNTER — Ambulatory Visit: Payer: Medicare Other | Admitting: Nurse Practitioner

## 2020-10-22 DIAGNOSIS — K648 Other hemorrhoids: Secondary | ICD-10-CM | POA: Diagnosis not present

## 2020-10-22 DIAGNOSIS — K625 Hemorrhage of anus and rectum: Secondary | ICD-10-CM | POA: Insufficient documentation

## 2020-10-22 DIAGNOSIS — K449 Diaphragmatic hernia without obstruction or gangrene: Secondary | ICD-10-CM | POA: Insufficient documentation

## 2020-10-22 NOTE — Progress Notes (Signed)
Primary Care Physician:  [name], DO Primary Gastroenterologist:  Dr. Gala Romney  Chief Complaint  Patient presents with  . Hemorrhoids    Bleeding after BM    HPI:   Ralph Stevens is a 70 y.o. male who presents on referral from primary care for bleeding hemorrhoids and hiatal hernia.  Reviewed information associated with referral including primary care office visit dated 09/02/2020 for bleeding hemorrhoids, hiatal hernia, and other medical issues.  At that time he noted bleeding hemorrhoids up to 1 week at a time but random, seems to be most prominent when not straining and notes fairly regular bowel movements.  No weight loss.  Last colonoscopy 2 years ago in [place], [place] with a history of polyps and recommended repeat in 5 years.  He also noted some chest pressure and fullness typically when he bends forward, currently on Nexium 20 mg daily for GERD and has a known hiatal hernia.  Recommended referral to GI.  Report was available for the most recent colonoscopy completed 12/13/2017 which found internal hemorrhoids, diverticulosis in the entire examined colon, otherwise normal.  Recommended repeat colonoscopy in 5 years based on previous personal history of adenomatous colon polyps.  He did have a CT angio of the chest 02/19/2020 which was to evaluate for pulmonary embolism.  At that time did note a small sliding-type hiatal hernia.  Today he states he is doing okay overall. He has had a total of 6 colon polyps. Mother had CRC and passed from it, diagnosed age 40. Also his maternal uncle, and MGM as well. Has known hemorrhoids. When he has a bowel movement about every third bowel movement, only on the tissue. This has gradually gotten worse, but has had this issue since 1990s. Bleeding only after a bowel movement. Denies straining, no hard stools. Has a bowel movement about 3-4 days a week, which is his normal. Has a hiatal hernia found incidentally on CT chest; only symptom  is "annoying discomfort" if he bends forward for prolonged amount of time. No GERD or dysphagia symptoms. Denies abdominal pain, N/V, melena, fever, chills, unintentional weight loss. Denies URI or flu-like symptoms. Denies loss of sense of taste or smell. The patient has received COVID-19 vaccination(s). They have had a bosster dose as well. Denies chest pain, dyspnea, dizziness, lightheadedness, syncope, near syncope. Denies any other upper or lower GI symptoms.  He does use suppositories for hemorrhoid bleeding which helps temporarily. Still with ongoing bleeding.  Past Medical History:  Diagnosis Date  . Acid reflux   . Arthritis   . Arthritis   . Cataract    Mixed form OU  . Enlarged prostate    PSA goes up and down   . GERD (gastroesophageal reflux disease)   . High cholesterol   . Hyperlipidemia   . Hypertension   . Hypertensive retinopathy    OU    Past Surgical History:  Procedure Laterality Date  . EYE SURGERY    . TONSILLECTOMY     age 76     Current Outpatient Medications  Medication Sig Dispense Refill  . alfuzosin (UROXATRAL) 10 MG 24 hr tablet Take 10 mg by mouth daily with breakfast.    . amLODipine (NORVASC) 5 MG tablet TAKE 1 TABLET BY MOUTH EVERY DAY 90 tablet 3  . aspirin 81 MG chewable tablet Chew 81 mg by mouth in the morning.     Marland Kitchen atorvastatin (LIPITOR) 20 MG tablet Take 1 tablet (20 mg total) by  mouth daily. 90 tablet 3  . esomeprazole (NEXIUM) 20 MG capsule Take 20 mg by mouth in the morning.     . fluticasone (FLONASE) 50 MCG/ACT nasal spray Place 1-2 sprays into both nostrils daily as needed for allergies or rhinitis. 48 mL 3  . lisinopril (ZESTRIL) 40 MG tablet Take 1 tablet (40 mg total) by mouth daily. 90 tablet 3  . metoprolol tartrate (LOPRESSOR) 50 MG tablet Take 1 tablet (50 mg total) by mouth 2 (two) times daily. 180 tablet 3   No current facility-administered medications for this visit.    Allergies as of 10/22/2020  . (No Known  Allergies)    Family History  Problem Relation Age of Onset  . Cancer Mother   . Colon cancer Mother   . Dementia Mother   . Alzheimer's disease Mother   . Emphysema Father   . Hypothyroidism Sister   . Thyroid disease Sister   . Hypertension Sister   . Multiple sclerosis Daughter   . Heart defect Daughter   . Colon cancer Maternal Grandmother   . Heart disease Maternal Grandfather   . Heart disease Paternal Grandmother   . Cancer Paternal Grandfather        unknown ( mouth/throat)  . Colon cancer Maternal Uncle     Social History   Socioeconomic History  . Marital status: Married    Spouse name: [name]  . Number of children: 3  . Years of education: Not on file  . Highest education level: Not on file  Occupational History    Comment: semi-retired   Tobacco Use  . Smoking status: Never Smoker  . Smokeless tobacco: Never Used  Vaping Use  . Vaping Use: Never used  Substance and Sexual Activity  . Alcohol use: Never  . Drug use: Never  . Sexual activity: Not on file  Other Topics Concern  . 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  He currently is unemployed but is actively looking for work as he enjoys being busy.   He recently relocated from [place] and resides locally with his wife.  He has a son that lives in [place] but his other 2 daughters are residing in [place].   Social Determinants of Health   Financial Resource Strain: Not on file  Food Insecurity: Not on file  Transportation Needs: Not on file  Physical Activity: Not on file  Stress: Not on file  Social Connections: Not on file  Intimate Partner Violence: Not on file    Subjective: Review of Systems  Constitutional: Negative for chills, fever, malaise/fatigue and weight loss.  HENT: Negative for congestion and sore throat.   Respiratory: Negative for cough and shortness of breath.   Cardiovascular: Negative  for chest pain and palpitations.  Gastrointestinal: Positive for blood in stool. Negative for abdominal pain, constipation, diarrhea, heartburn, melena, nausea and vomiting.  Musculoskeletal: Negative for joint pain and myalgias.  Skin: Negative for rash.  Neurological: Negative for dizziness and weakness.  Endo/Heme/Allergies: Does not bruise/bleed easily.  Psychiatric/Behavioral: Negative for depression. The patient is not nervous/anxious.   All other systems reviewed and are negative.      Objective: BP (!) 146/91   Pulse 88   Temp 97.7 F (36.5 C)   Ht 6' (1.829 m)   Wt 200 lb 9.6 oz (91 kg)   BMI 27.21 kg/m  Physical Exam Vitals and nursing  note reviewed.  Constitutional:      General: He is not in acute distress.    Appearance: Normal appearance. He is normal weight. He is not ill-appearing, toxic-appearing or diaphoretic.  HENT:     Head: Normocephalic and atraumatic.     Nose: No congestion or rhinorrhea.  Eyes:     General: No scleral icterus. Cardiovascular:     Rate and Rhythm: Normal rate and regular rhythm.     Heart sounds: Normal heart sounds.  Pulmonary:     Effort: Pulmonary effort is normal.     Breath sounds: Normal breath sounds.  Abdominal:     General: Bowel sounds are normal. There is no distension.     Palpations: Abdomen is soft. There is no hepatomegaly, splenomegaly or mass.     Tenderness: There is no abdominal tenderness. There is no guarding or rebound.     Hernia: No hernia is present.  Musculoskeletal:     Cervical back: Neck supple.  Skin:    General: Skin is warm and dry.     Coloration: Skin is not jaundiced.     Findings: No bruising or rash.  Neurological:     General: No focal deficit present.     Mental Status: He is alert and oriented to person, place, and time. Mental status is at baseline.  Psychiatric:        Mood and Affect: Mood normal.        Behavior: Behavior normal.        Thought Content: Thought content normal.       Assessment:  Very pleasant 70 year old male presents on referral from primary care for hiatal hernia and bleeding hemorrhoids.  Other than the rectal bleeding there are no red flag/warning signs or symptoms today.  Colonoscopy is up-to-date 2019 next due in 2024, a copy is scanned into our system and he is provided a color copy for [place] today.  Hiatal hernia: Minimal to no symptoms.  He only has "annoying discomfort" if he bends forward for prolonged amount of time.  Denies overt GERD symptoms, dysphagia symptoms.  No regurgitation of food substances.  We discussed the fact that hiatal hernias are very common.  His CT of the chest a couple years ago documented a small hiatal hernia.  Generally no intervention unless hernia becomes significantly symptomatic or if it is quite large.  Updated this point he can discontinue his PPI to manage any possible GERD symptoms, follow-up as needed for this.  Bleeding internal hemorrhoids: Fortunately, the patient brought a color copy of his most recent colonoscopy completed in 2019 in [facility].  The images do clearly show at least moderate size internal hemorrhoids.  He has been using rectal suppositories which help in the short-term, but he does still have persistent bleeding up to a couple/few times a week.  Generally the blood is limited to the toilet tissue.  No large voluminous bleeding.  I feel given the tissue hematochezia there is no need to repeat his colonoscopy as of right now.  If his symptoms change, we could change course.  In the interim, I discussed in office hemorrhoid banding.  I will discuss with Laban Emperor, NP about his candidacy for banding.  I feel he would likely be a good candidate for hemorrhoid banding he is interested.  If she agrees and we will proceed with scheduling for banding to help eradicate his internal hemorrhoids and alleviate the symptoms.   Plan: 1. Continue current medications 2.  Discussed with Laban Emperor, NP  hemorrhoid banding candidacy and schedule as able 3. Follow-up based on recommendations from Vicente Males or as needed    Thank you for allowing [place] to participate in the care of Donald Pore, DNP, AGNP-C Adult & Gerontological Nurse Practitioner [facility]   10/22/2020 3:04 PM   Disclaimer: This note was dictated with voice recognition software. Similar sounding words can inadvertently be transcribed and may not be corrected upon review.

## 2020-10-22 NOTE — Patient Instructions (Signed)
Your health issues we discussed today were:   Hiatal hernia: 1. As we discussed, hiatal hernias are very common 2. Your hiatal hernia appears to be small and, based on our discussion, you do not seem to be having a lot of significant symptoms 3. Generally we monitor hernias for any worsening or development of any significant symptoms 4. We can refer you to surgery, if needed in the future, for hiatal hernia repair depending on your progression of symptoms 5. Call us for any worsening or severe symptoms  Internal hemorrhoids with bleeding: 1. I am glad your last colonoscopy looked good 2. You will next be due for colonoscopy in 2 more years 3. Given your bleeding hemorrhoids and failure of topical therapy (suppositories) to provide lasting relief, I will discuss possible hemorrhoid banding with Laban Emperor, NP who does the in office banding 4. I feel you will likely be a candidate.  If she agrees we can call and schedule an appointment 5. Call us for any large-volume bleeding or unusual changes in the amount or quality of bleeding  Overall I recommend:  1. Continue other current medications 2. Return for follow-up based on Anna's recommendations, or as needed for GI problems 3. Call us if you have any questions or concerns   ---------------------------------------------------------------  I am glad you have gotten your COVID-19 vaccination!  Even though you are fully vaccinated you should continue to follow CDC and state/local guidelines.  ---------------------------------------------------------------   At [facility] we value your feedback. You may receive a survey about your visit today. Please share your experience as we strive to create trusting relationships with our patients to provide genuine, compassionate, quality care.  We appreciate your understanding and patience as we review any laboratory studies, imaging, and other diagnostic tests that are ordered as we  care for you. Our office policy is 5 business days for review of these results, and any emergent or urgent results are addressed in a timely manner for your best interest. If you do not hear from our office in 1 week, please contact us.   We also encourage the use of MyChart, which contains your medical information for your review as well. If you are not enrolled in this feature, an access code is on this after visit summary for your convenience. Thank you for allowing [place] to be involved in your care.  It was great to see you today!  I hope you have a great spring!!

## 2020-11-09 ENCOUNTER — Other Ambulatory Visit: Payer: Self-pay

## 2020-11-09 ENCOUNTER — Encounter: Payer: Self-pay | Admitting: Nurse Practitioner

## 2020-11-09 ENCOUNTER — Ambulatory Visit (INDEPENDENT_AMBULATORY_CARE_PROVIDER_SITE_OTHER): Payer: Medicare Other | Admitting: Nurse Practitioner

## 2020-11-09 VITALS — BP 141/84 | HR 86 | Temp 98.7°F | Resp 20 | Ht 72.0 in | Wt 199.0 lb

## 2020-11-09 DIAGNOSIS — B029 Zoster without complications: Secondary | ICD-10-CM | POA: Diagnosis not present

## 2020-11-09 DIAGNOSIS — L819 Disorder of pigmentation, unspecified: Secondary | ICD-10-CM | POA: Diagnosis not present

## 2020-11-09 MED ORDER — VALACYCLOVIR HCL 1 G PO TABS: 1000.0000 mg | ORAL_TABLET | Freq: Three times a day (TID) | ORAL | 0 refills | Status: DC

## 2020-11-09 NOTE — Patient Instructions (Signed)
Shingles  Shingles is an infection. It gives you a painful skin rash and blisters that have fluid in them. Shingles is caused by the same germ (virus) that causes chickenpox. Shingles only happens in people who:  Have had chickenpox.  Have been given a shot of medicine (vaccine) to protect against chickenpox. Shingles is rare in this group. The first symptoms of shingles may be itching, tingling, or pain in an area on your skin. A rash will show on your skin a few days or weeks later. The rash is likely to be on one side of your body. The rash usually has a shape like a belt or a band. Over time, the rash turns into fluid-filled blisters. The blisters will break open, change into scabs, and dry up. Medicines may:  Help with pain and itching.  Help you get better sooner.  Help to prevent long-term problems. Follow these instructions at home: Medicines  Take over-the-counter and prescription medicines only as told by your doctor.  Put on an anti-itch cream or numbing cream where you have a rash, blisters, or scabs. Do this as told by your doctor. Helping with itching and discomfort  Put cold, wet cloths (cold compresses) on the area of the rash or blisters as told by your doctor.  Cool baths can help you feel better. Try adding baking soda or dry oatmeal to the water to lessen itching. Do not bathe in hot water.   Blister and rash care  Keep your rash covered with a loose bandage (dressing).  Wear loose clothing that does not rub on your rash.  Keep your rash and blisters clean. To do this, wash the area with mild soap and cool water as told by your doctor.  Check your rash every day for signs of infection. Check for: ? More redness, swelling, or pain. ? Fluid or blood. ? Warmth. ? Pus or a bad smell.  Do not scratch your rash. Do not pick at your blisters. To help you to not scratch: ? Keep your fingernails clean and cut short. ? Wear gloves or mittens when you sleep, if  scratching is a problem. General instructions  Rest as told by your doctor.  Keep all follow-up visits as told by your doctor. This is important.  Wash your hands often with soap and water. If soap and water are not available, use hand sanitizer. Doing this lowers your chance of getting a skin infection caused by germs (bacteria).  Your infection can cause chickenpox in people who have never had chickenpox or never got a shot of chickenpox vaccine. If you have blisters that did not change into scabs yet, try not to touch other people or be around other people, especially: ? Babies. ? Pregnant women. ? Children who have areas of red, itchy, or rough skin (eczema). ? Very old people who have transplants. ? People who have a long-term (chronic) sickness, like cancer or AIDS. Contact a doctor if:  Your pain does not get better with medicine.  Your pain does not get better after the rash heals.  You have any signs of infection in the rash area. These signs include: ? More redness, swelling, or pain around the rash. ? Fluid or blood coming from the rash. ? The rash area feeling warm to the touch. ? Pus or a bad smell coming from the rash. Get help right away if:  The rash is on your face or nose.  You have pain in your face or pain  by your eye.  You lose feeling on one side of your face.  You have trouble seeing.  You have ear pain, or you have ringing in your ear.  You have a loss of taste.  Your condition gets worse. Summary  Shingles gives you a painful skin rash and blisters that have fluid in them.  Shingles is an infection. It is caused by the same germ (virus) that causes chickenpox.  Keep your rash covered with a loose bandage (dressing). Wear loose clothing that does not rub on your rash.  If you have blisters that did not change into scabs yet, try not to touch other people or be around people. This information is not intended to replace advice given to you by  your health care provider. Make sure you discuss any questions you have with your health care provider. Document Revised: 11/30/2018 Document Reviewed: 04/12/2017 Elsevier Patient Education  2021 [employer].

## 2020-11-09 NOTE — Progress Notes (Signed)
   Subjective:    Patient ID: Ralph Stevens, male    DOB: 22-Jan-1951, 70 y.o.   MRN: [id number]   Chief Complaint: spots on forehead and arms (Left ear pain/)   HPI Patient come sin today stating that face started hurting on left side Saturday morning. He developed some blisters since then . Still very painful. Rate [place] 3/10. He also has spots on his left arm that he has made an appointment to see derm about them the spots seem to be getting bigger.    Review of Systems  Constitutional: Negative.   Respiratory: Negative.   Cardiovascular: Negative.   Skin: Positive for rash (left forehead).  Neurological: Negative.   Psychiatric/Behavioral: Negative.   All other systems reviewed and are negative.      Objective:   Physical Exam Vitals and nursing note reviewed.  Constitutional:      Appearance: Normal appearance.  Cardiovascular:     Rate and Rhythm: Normal rate and regular rhythm.     Heart sounds: Normal heart sounds.  Pulmonary:     Breath sounds: Normal breath sounds.  Musculoskeletal:        General: Normal range of motion.  Skin:    General: Skin is warm.     Findings: Rash (erythematous vesicular lesions in patch pattern on left forehead.) present.  Neurological:     General: No focal deficit present.     Mental Status: He is alert and oriented to person, place, and time.  Psychiatric:        Mood and Affect: Mood normal.        Behavior: Behavior normal.     BP (!) 141/84   Pulse 86   Temp 98.7 F (37.1 C) (Temporal)   Resp 20   Ht 6' (1.829 m)   Wt 199 lb (90.3 kg)   SpO2 97%   BMI 26.99 kg/m        Assessment & Plan:  [name] in today with chief complaint of spots on forehead and arms (Left ear pain/)   1. Herpes zoster without complication Ice Avoid rubbing Motrin OTC for pain Meds ordered this encounter  Medications  . valACYclovir (VALTREX) 1000 MG tablet    Sig: Take 1 tablet (1,000 mg total) by mouth 3 (three)  times daily.    Dispense:  21 tablet    Refill:  0    Order Specific Question:   Supervising Provider    Answer:   Caryl Pina A [[id number]]     2. Multiple hypopigmented skin lesions on both forearms Keep appointment with dermatology later this week.    The above assessment and management plan was discussed with the patient. The patient verbalized understanding of and has agreed to the management plan. Patient is aware to call the clinic if symptoms persist or worsen. Patient is aware when to return to the clinic for a follow-up visit. Patient educated on when it is appropriate to go to the emergency department.   Mary-Margaret Hassell Done, FNP

## 2020-11-12 ENCOUNTER — Other Ambulatory Visit: Payer: Self-pay

## 2020-11-12 ENCOUNTER — Encounter (INDEPENDENT_AMBULATORY_CARE_PROVIDER_SITE_OTHER): Payer: Self-pay | Admitting: Ophthalmology

## 2020-11-12 ENCOUNTER — Ambulatory Visit (INDEPENDENT_AMBULATORY_CARE_PROVIDER_SITE_OTHER): Payer: Medicare Other | Admitting: Ophthalmology

## 2020-11-12 ENCOUNTER — Telehealth: Payer: Self-pay

## 2020-11-12 DIAGNOSIS — I10 Essential (primary) hypertension: Secondary | ICD-10-CM

## 2020-11-12 DIAGNOSIS — H25813 Combined forms of age-related cataract, bilateral: Secondary | ICD-10-CM

## 2020-11-12 DIAGNOSIS — H35033 Hypertensive retinopathy, bilateral: Secondary | ICD-10-CM | POA: Diagnosis not present

## 2020-11-12 DIAGNOSIS — Z9889 Other specified postprocedural states: Secondary | ICD-10-CM | POA: Diagnosis not present

## 2020-11-12 DIAGNOSIS — H35372 Puckering of macula, left eye: Secondary | ICD-10-CM | POA: Diagnosis not present

## 2020-11-12 DIAGNOSIS — H3581 Retinal edema: Secondary | ICD-10-CM | POA: Diagnosis not present

## 2020-11-12 DIAGNOSIS — B0233 Zoster keratitis: Secondary | ICD-10-CM | POA: Diagnosis not present

## 2020-11-12 MED ORDER — VALACYCLOVIR HCL 1 G PO TABS: 1000.0000 mg | ORAL_TABLET | Freq: Three times a day (TID) | ORAL | 0 refills | Status: DC

## 2020-11-12 MED ORDER — GANCICLOVIR 0.15 % OP GEL: 1.0000 [drp] | Freq: Four times a day (QID) | OPHTHALMIC | 2 refills | Status: DC

## 2020-11-12 NOTE — Telephone Encounter (Signed)
Patient aware and verbalizes understanding. 

## 2020-11-12 NOTE — Progress Notes (Signed)
[facility] Note  11/12/2020     CHIEF COMPLAINT Patient presents for Retina Evaluation   HISTORY OF PRESENT ILLNESS: Ralph Stevens is a 70 y.o. male who presents to the clinic today for:   HPI    Retina Evaluation    In left eye.  This started 4 days ago.  Associated Symptoms Redness.          Comments    Patient here for Retina Evaluation. Patient states started Monday (4 days ago) shingles started. First had pain in cheek area and brow area. No bumps. Then it hurt to touch. The next day had bumps. Last night OS lids swelled up. Was put on Valacyclovir.       Last edited by Theodore Demark, COA on 11/12/2020  2:31 PM. (History)    pt states vision is doing well, pt missed his appt last month due to being hospitalized for sepsis from a UTI, he states he is just now starting to feel better  Referring physician: [name], DO [place],  [place] [address]  HISTORICAL INFORMATION:   Selected notes from the [facility] Referred by PCP (Dr. Lajuana Ripple) for history of retinal tear OS   CURRENT MEDICATIONS: Current Outpatient Medications (Ophthalmic Drugs)  Medication Sig  . Ganciclovir (ZIRGAN) 0.15 % GEL Place 1 drop into the left eye 4 (four) times daily.   No current facility-administered medications for this visit. (Ophthalmic Drugs)   Current Outpatient Medications (Other)  Medication Sig  . alfuzosin (UROXATRAL) 10 MG 24 hr tablet Take 10 mg by mouth daily with breakfast.  . amLODipine (NORVASC) 5 MG tablet TAKE 1 TABLET BY MOUTH EVERY DAY  . aspirin 81 MG chewable tablet Chew 81 mg by mouth in the morning.   Marland Kitchen atorvastatin (LIPITOR) 20 MG tablet Take 1 tablet (20 mg total) by mouth daily.  Marland Kitchen esomeprazole (NEXIUM) 20 MG capsule Take 20 mg by mouth in the morning.   . fluticasone (FLONASE) 50 MCG/ACT nasal spray Place 1-2 sprays into both nostrils daily as needed for allergies or rhinitis.  Marland Kitchen lisinopril  (ZESTRIL) 40 MG tablet Take 1 tablet (40 mg total) by mouth daily.  . metoprolol tartrate (LOPRESSOR) 50 MG tablet Take 1 tablet (50 mg total) by mouth 2 (two) times daily.  . valACYclovir (VALTREX) 1000 MG tablet Take 1 tablet (1,000 mg total) by mouth 3 (three) times daily.   No current facility-administered medications for this visit. (Other)      REVIEW OF SYSTEMS: ROS    Positive for: Gastrointestinal, Skin, Musculoskeletal, Eyes   Negative for: Constitutional, Neurological, Genitourinary, HENT, Endocrine, Cardiovascular, Respiratory, Psychiatric, Allergic/Imm, Heme/Lymph   Last edited by Theodore Demark, COA on 11/12/2020  2:30 PM. (History)       ALLERGIES No Known Allergies  PAST MEDICAL HISTORY Past Medical History:  Diagnosis Date  . Acid reflux   . Arthritis   . Arthritis   . Cataract    Mixed form OU  . Enlarged prostate    PSA goes up and down   . GERD (gastroesophageal reflux disease)   . High cholesterol   . Hyperlipidemia   . Hypertension   . Hypertensive retinopathy    OU   Past Surgical History:  Procedure Laterality Date  . EYE SURGERY    . TONSILLECTOMY     age 44     FAMILY HISTORY Family History  Problem Relation Age of Onset  .  Cancer Mother   . Colon cancer Mother   . Dementia Mother   . Alzheimer's disease Mother   . Emphysema Father   . Hypothyroidism Sister   . Thyroid disease Sister   . Hypertension Sister   . Multiple sclerosis Daughter   . Heart defect Daughter   . Colon cancer Maternal Grandmother   . Heart disease Maternal Grandfather   . Heart disease Paternal Grandmother   . Cancer Paternal Grandfather        unknown ( mouth/throat)  . Colon cancer Maternal Uncle     SOCIAL HISTORY Social History   Tobacco Use  . Smoking status: Never Smoker  . Smokeless tobacco: Never Used  Vaping Use  . Vaping Use: Never used  Substance Use Topics  . Alcohol use: Never  . Drug use: Never         OPHTHALMIC  EXAM:  Base Eye Exam    Visual Acuity (Snellen - Linear)      Right Left   Dist cc 20/20 -2 20/30   Dist ph cc  NI   Correction: Glasses       Tonometry (Tonopen, 2:25 PM)      Right Left   Pressure 17 18       Pupils      Dark Light Shape React APD   Right 3 2 Round Brisk None   Left 3 2 Round Brisk None       Visual Fields (Counting fingers)      Left Right    Full Full       Extraocular Movement      Right Left    Full Full       Neuro/Psych    Oriented x3: Yes   Mood/Affect: Normal       Dilation    Both eyes: 1.0% Mydriacyl, 2.5% Phenylephrine @ 2:25 PM        Slit Lamp and Fundus Exam    External Exam      Right Left   External  vesicles on left forehead and scalp, negative Hutchinson's sign       Slit Lamp Exam      Right Left   Lids/Lashes Dermatochalasis - upper lid, mild Meibomian gland dysfunction 3-4 +periorbital edema, no vesicles on  lids   Conjunctiva/Sclera White and quiet 1-2+ injection nasally   Cornea Trace Punctate epithelial erosions, mild Arcus High tear lake, no dendrites, punctate fluorescein staining superior cornea   Anterior Chamber Deep,clear, narrow temporal angle Deep and quiet   Iris Round and moderately dilated to 5.53mm Round and moderately dilated to 5.25mm   Lens 2-3+ Nuclear sclerosis, 2-3+ Cortical cataract 2-3+ Nuclear sclerosis, 2-3+ Cortical cataract   Vitreous Vitreous syneresis Vitreous syneresis, clear, no vitritis        Fundus Exam      Right Left   Disc Pink and Sharp Pink and Sharp   C/D Ratio 0.4 0.2   Macula Flat, Good foveal reflex, Retinal pigment epithelial mottling, No heme or edema Flat, blunted foveal reflex, ERM with striae, No heme    Vessels Mild Vascular attenuation, mild Tortuousity Mild Vascular attenuation, mild Tortuousity   Periphery Attached, No heme  Attached, operculated tear at 1200 with good laser surrounding, No heme         Refraction    Wearing Rx      Sphere Cylinder Axis Add    Right -2.75 +3.75 177 +2.50   Left -1.25 +2.75 173 +2.50  Manifest Refraction      Sphere Cylinder Axis Dist VA   Right -2.25 +3.25 004 20/20   Left +1.75 +3.00 180 20/25          IMAGING AND PROCEDURES  Imaging and Procedures for @TODAY @  OCT, Retina - OU - Both Eyes       Right Eye Quality was good. Central Foveal Thickness: 287. Progression has been stable. Findings include normal foveal contour, no IRF, no SRF.   Left Eye Quality was good. Central Foveal Thickness: 377. Progression has been stable. Findings include abnormal foveal contour, epiretinal membrane, no IRF, no SRF, macular pucker (Mild interval improvement in foveal profile; stable ERM).   Notes *Images captured and stored on drive  Diagnosis / Impression:  OD: NFP, no IRF/SRF OS: +ERM w/ pucker; abnormal foveal profile; no IRF/SRF  Clinical management:  See below  Abbreviations: NFP - Normal foveal profile. CME - cystoid macular edema. PED - pigment epithelial detachment. IRF - intraretinal fluid. SRF - subretinal fluid. EZ - ellipsoid zone. ERM - epiretinal membrane. ORA - outer retinal atrophy. ORT - outer retinal tubulation. 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 Epiretinal membrane (ERM) of left eye  H35.372   3. Retinal edema  H35.81 OCT, Retina - OU - Both Eyes  4. History of repair of retinal tear by laser photocoagulation  Z98.890   5. Essential hypertension  I10   6. Hypertensive retinopathy of both eyes  H35.033   7. Combined forms of age-related cataract of both eyes  H25.813    1. Herpes zoster with ocular involvement OS  - pt diagnosed w/ herpes zoster on 3.21.22 and started on 1g po valtrex TID x7 days  - this morning awoke with left eyelids swollen shut -- pt reports improvement since this morning  - exam shows periorbital vesicles in V1 dermatome (no eyelid  vesicles, negative Hutchinson's sign) and significant periorbital edema  - mild conj injection and tr punctate fluorescein staining, concerning for early dendrite formation  - recommend continuing valtrex 1000 mg tid to complete at least 10 days total   - will start Zirgan oph gel OS QID to empirically treat possible corneal involvement  - start artificial tears tid-qid OS  - cool compresses tid-qid OS  - f/u 2 weeks, sooner prn  2,3. Epiretinal membrane, OS  - likely related to history of RT s/p laser retinopexy (see below)  - mild ERM w/ early pucker -- stable from prior  - asymptomatic, no metamorphopsia  - no indication for surgery at this time  - monitor for now  - f/u 9 mos, DFE, OCT  4. History of retinal tear OS  - operculated tear located at 1200  - s/p laser retinopexy (Dr. Shana Chute, [facility], [place], [place]) -- good laser surrounding  - stable  - no other RT/RD  5,6. Hypertensive retinopathy OU  - discussed importance of tight BP control  - monitor  7. Mixed form age related cataracts OU  - The symptoms of cataract, surgical options, and treatments and risks were discussed with patient.  - discussed diagnosis and progression  - not yet visually significant  - monitor for now    Ophthalmic Meds Ordered this visit:  Meds ordered this encounter  Medications  . Ganciclovir (ZIRGAN) 0.15 % GEL  Sig: Place 1 drop into the left eye 4 (four) times daily.    Dispense:  5 g    Refill:  2  . valACYclovir (VALTREX) 1000 MG tablet    Sig: Take 1 tablet (1,000 mg total) by mouth 3 (three) times daily.    Dispense:  9 tablet    Refill:  0       Return in about 2 weeks (around 11/26/2020) for DFE, OCT.  There are no Patient Instructions on file for this visit.   Explained the diagnoses, plan, and follow up with the patient and they expressed understanding.  Patient expressed understanding of the importance of proper follow up care.   This document serves  as a record of services personally performed by Gardiner Sleeper, MD, PhD. It was created on their behalf by San Jetty. Owens Shark, OA an ophthalmic technician. The creation of this record is the provider's dictation and/or activities during the visit.    Electronically signed by: San Jetty. Owens Shark, [place] 03.24.2022 3:25 PM  Gardiner Sleeper, M.D., Ph.D. Diseases & Surgery of the Retina and Vitreous [facility]  I have reviewed the above documentation for accuracy and completeness, and I agree with the above. Gardiner Sleeper, M.D., Ph.D. 11/12/20 3:25 PM  Abbreviations: M myopia (nearsighted); A astigmatism; H hyperopia (farsighted); P presbyopia; Mrx spectacle prescription;  CTL contact lenses; OD right eye; OS left eye; OU both eyes  XT exotropia; ET esotropia; PEK punctate epithelial keratitis; PEE punctate epithelial erosions; DES dry eye syndrome; MGD meibomian gland dysfunction; ATs artificial tears; PFAT's preservative free artificial tears; [place] nuclear sclerotic cataract; PSC posterior subcapsular cataract; ERM epi-retinal membrane; PVD posterior vitreous detachment; RD retinal detachment; DM diabetes mellitus; DR diabetic retinopathy; NPDR non-proliferative diabetic retinopathy; PDR proliferative diabetic retinopathy; CSME clinically significant macular edema; DME diabetic macular edema; dbh dot blot hemorrhages; CWS cotton wool spot; POAG primary open angle glaucoma; C/D cup-to-disc ratio; HVF humphrey visual field; GVF goldmann visual field; OCT optical coherence tomography; IOP intraocular pressure; BRVO Branch retinal vein occlusion; CRVO central retinal vein occlusion; CRAO central retinal artery occlusion; BRAO branch retinal artery occlusion; RT retinal tear; SB scleral buckle; PPV pars plana vitrectomy; VH Vitreous hemorrhage; PRP panretinal laser photocoagulation; IVK intravitreal kenalog; VMT vitreomacular traction; MH Macular hole;  NVD neovascularization of the disc; NVE  neovascularization elsewhere; AREDS age related eye disease study; ARMD age related macular degeneration; POAG primary open angle glaucoma; EBMD epithelial/anterior basement membrane dystrophy; ACIOL anterior chamber intraocular lens; IOL intraocular lens; PCIOL posterior chamber intraocular lens; Phaco/IOL phacoemulsification with intraocular lens placement; [place] photorefractive keratectomy; LASIK laser assisted in situ keratomileusis; HTN hypertension; DM diabetes mellitus; COPD chronic obstructive pulmonary disease

## 2020-11-12 NOTE — Telephone Encounter (Signed)
Needs to see eye doctor ASAP

## 2020-11-12 NOTE — Progress Notes (Deleted)
[facility] Note  11/12/2020     CHIEF COMPLAINT Patient presents for Retina Evaluation   HISTORY OF PRESENT ILLNESS: Ralph Stevens is a 70 y.o. male who presents to the clinic today for:   HPI    Retina Evaluation    Laterality: left eye   Onset: 4 days ago   Associated Symptoms: Redness          Comments    Patient here for Retina Evaluation. Patient states started Monday (4 days ago) shingles started. First had pain in cheek area and brow area. No bumps. Then it hurt to touch. The next day had bumps. Last night OS lids swelled up. Was put on Valacyclovir.       Last edited by Theodore Demark, COA on 11/12/2020  2:31 PM. (History)     Patient states started with mild pain on left side of face about 4 days ago. Patient thought he had sinus infection. Pain progressed to feeling like pins sticking in skin, followed by the development of lesions on left side of face. Left lids and around left eye swollen.    Referring physician: [name], DO [place],  [place] [address]  HISTORICAL INFORMATION:   Selected notes from the MEDICAL RECORD NUMBER Referred by Dr. Truman Hayward:  Ocular Hx- PMH-    CURRENT MEDICATIONS: Current Outpatient Medications (Ophthalmic Drugs)  Medication Sig  . Ganciclovir (ZIRGAN) 0.15 % GEL Place 1 drop into the left eye 4 (four) times daily.   No current facility-administered medications for this visit. (Ophthalmic Drugs)   Current Outpatient Medications (Other)  Medication Sig  . alfuzosin (UROXATRAL) 10 MG 24 hr tablet Take 10 mg by mouth daily with breakfast.  . amLODipine (NORVASC) 5 MG tablet TAKE 1 TABLET BY MOUTH EVERY DAY  . aspirin 81 MG chewable tablet Chew 81 mg by mouth in the morning.   Marland Kitchen atorvastatin (LIPITOR) 20 MG tablet Take 1 tablet (20 mg total) by mouth daily.  Marland Kitchen esomeprazole (NEXIUM) 20 MG capsule Take 20 mg by mouth in the morning.   . fluticasone (FLONASE) 50 MCG/ACT nasal  spray Place 1-2 sprays into both nostrils daily as needed for allergies or rhinitis.  Marland Kitchen lisinopril (ZESTRIL) 40 MG tablet Take 1 tablet (40 mg total) by mouth daily.  . metoprolol tartrate (LOPRESSOR) 50 MG tablet Take 1 tablet (50 mg total) by mouth 2 (two) times daily.  . valACYclovir (VALTREX) 1000 MG tablet Take 1 tablet (1,000 mg total) by mouth 3 (three) times daily.   No current facility-administered medications for this visit. (Other)      REVIEW OF SYSTEMS: ROS    Positive for: Gastrointestinal, Skin, Musculoskeletal, Eyes   Negative for: Constitutional, Neurological, Genitourinary, HENT, Endocrine, Cardiovascular, Respiratory, Psychiatric, Allergic/Imm, Heme/Lymph   Last edited by Theodore Demark, COA on 11/12/2020  2:30 PM. (History)       ALLERGIES No Known Allergies  PAST MEDICAL HISTORY Past Medical History:  Diagnosis Date  . Acid reflux   . Arthritis   . Arthritis   . Cataract    Mixed form OU  . Enlarged prostate    PSA goes up and down   . GERD (gastroesophageal reflux disease)   . High cholesterol   . Hyperlipidemia   . Hypertension   . Hypertensive retinopathy    OU   Past Surgical History:  Procedure Laterality Date  . EYE SURGERY    . TONSILLECTOMY  age 44     FAMILY HISTORY Family History  Problem Relation Age of Onset  . Cancer Mother   . Colon cancer Mother   . Dementia Mother   . Alzheimer's disease Mother   . Emphysema Father   . Hypothyroidism Sister   . Thyroid disease Sister   . Hypertension Sister   . Multiple sclerosis Daughter   . Heart defect Daughter   . Colon cancer Maternal Grandmother   . Heart disease Maternal Grandfather   . Heart disease Paternal Grandmother   . Cancer Paternal Grandfather        unknown ( mouth/throat)  . Colon cancer Maternal Uncle     SOCIAL HISTORY Social History   Tobacco Use  . Smoking status: Never Smoker  . Smokeless tobacco: Never Used  Vaping Use  . Vaping Use: Never used   Substance Use Topics  . Alcohol use: Never  . Drug use: Never         OPHTHALMIC EXAM:  Base Eye Exam    Visual Acuity (Snellen - Linear)      Right Left   Dist cc 20/20 -2 20/30   Dist ph cc  NI   Correction: Glasses       Tonometry (Tonopen, 2:25 PM)      Right Left   Pressure 17 18       Pupils      Dark Light Shape React APD   Right 3 2 Round Brisk None   Left 3 2 Round Brisk None       Visual Fields (Counting fingers)      Left Right    Full Full       Extraocular Movement      Right Left    Full Full       Neuro/Psych    Oriented x3: Yes   Mood/Affect: Normal       Dilation    Both eyes: 1.0% Mydriacyl, 2.5% Phenylephrine @ 2:25 PM        Slit Lamp and Fundus Exam    External Exam      Right Left   External  vesicles on left forehead and scalp, negative Hutchinson's sign       Slit Lamp Exam      Right Left   Lids/Lashes Dermatochalasis - upper lid, mild Meibomian gland dysfunction 3-4 +periorbital edema, no vesicles on  lids   Conjunctiva/Sclera White and quiet 1-2+ injeciton nasally   Cornea Trace Punctate epithelial erosions, mild Arcus High tear lake, no dendrites, punctate fluorescein staining superior cornea   Anterior Chamber Deep,clear, narrow temporal angle Deep and quiet   Iris Round and moderately dilated to 5.11mm Round and moderately dilated to 5.30mm   Lens 2-3+ Nuclear sclerosis, 2-3+ Cortical cataract 2-3+ Nuclear sclerosis, 2-3+ Cortical cataract   Vitreous Vitreous syneresis Vitreous syneresis, clear, no vitritis        Fundus Exam      Right Left   Disc Pink and Sharp Pink and Sharp   C/D Ratio 0.4 0.2   Macula Flat, Good foveal reflex, Retinal pigment epithelial mottling, No heme or edema Flat, blunted foveal reflex, ERM with striae, No heme    Vessels Mild Vascular attenuation, mild Tortuousity Mild Vascular attenuation, mild Tortuousity   Periphery Attached, No heme  Attached, operculated tear at 1200 with good laser  surrounding, No heme         Refraction    Wearing Rx      Sphere  Cylinder Axis Add   Right -2.75 +3.75 177 +2.50   Left -1.25 +2.75 173 +2.50       Manifest Refraction      Sphere Cylinder Axis Dist VA   Right -2.25 +3.25 004 20/20   Left +1.75 +3.00 180 20/25          IMAGING AND PROCEDURES  Imaging and Procedures for 11/12/2020           ASSESSMENT/PLAN:    ICD-10-CM   1. Epiretinal membrane (ERM) of left eye  H35.372   2. Retinal edema  H35.81 OCT, Retina - OU - Both Eyes  3. History of repair of retinal tear by laser photocoagulation  Z98.890   4. Essential hypertension  I10   5. Hypertensive retinopathy of both eyes  H35.033   6. Combined forms of age-related cataract of both eyes  H25.813       2.  3.  Ophthalmic Meds Ordered this visit:  Meds ordered this encounter  Medications  . Ganciclovir (ZIRGAN) 0.15 % GEL    Sig: Place 1 drop into the left eye 4 (four) times daily.    Dispense:  5 g    Refill:  2       Return in about 2 weeks (around 11/26/2020) for DFE, OCT.  There are no Patient Instructions on file for this visit.   Explained the diagnoses, plan, and follow up with the patient and they expressed understanding.  Patient expressed understanding of the importance of proper follow up care.   This document serves as a record of services personally performed by Gardiner Sleeper, MD, PhD. It was created on their behalf by Roselee Nova, COMT. The creation of this record is the provider's dictation and/or activities during the visit.  Electronically signed by: Roselee Nova, COMT 11/12/20 3:11 PM     Gardiner Sleeper, M.D., Ph.D. Diseases & Surgery of the Retina and [facility] @TODAY @     Abbreviations: M myopia (nearsighted); A astigmatism; H hyperopia (farsighted); P presbyopia; Mrx spectacle prescription;  CTL contact lenses; OD right eye; OS left eye; OU both eyes  XT exotropia; ET esotropia; PEK  punctate epithelial keratitis; PEE punctate epithelial erosions; DES dry eye syndrome; MGD meibomian gland dysfunction; ATs artificial tears; PFAT's preservative free artificial tears; [place] nuclear sclerotic cataract; PSC posterior subcapsular cataract; ERM epi-retinal membrane; PVD posterior vitreous detachment; RD retinal detachment; DM diabetes mellitus; DR diabetic retinopathy; NPDR non-proliferative diabetic retinopathy; PDR proliferative diabetic retinopathy; CSME clinically significant macular edema; DME diabetic macular edema; dbh dot blot hemorrhages; CWS cotton wool spot; POAG primary open angle glaucoma; C/D cup-to-disc ratio; HVF humphrey visual field; GVF goldmann visual field; OCT optical coherence tomography; IOP intraocular pressure; BRVO Branch retinal vein occlusion; CRVO central retinal vein occlusion; CRAO central retinal artery occlusion; BRAO branch retinal artery occlusion; RT retinal tear; SB scleral buckle; PPV pars plana vitrectomy; VH Vitreous hemorrhage; PRP panretinal laser photocoagulation; IVK intravitreal kenalog; VMT vitreomacular traction; MH Macular hole;  NVD neovascularization of the disc; NVE neovascularization elsewhere; AREDS age related eye disease study; ARMD age related macular degeneration; POAG primary open angle glaucoma; EBMD epithelial/anterior basement membrane dystrophy; ACIOL anterior chamber intraocular lens; IOL intraocular lens; PCIOL posterior chamber intraocular lens; Phaco/IOL phacoemulsification with intraocular lens placement; [place] photorefractive keratectomy; LASIK laser assisted in situ keratomileusis; HTN hypertension; DM diabetes mellitus; COPD chronic obstructive pulmonary disease

## 2020-11-12 NOTE — Telephone Encounter (Signed)
Pt was here 11/09/2020 to see mmm for shingles. It has spread in his eyes. His eyes are swollen now. He wants to know what to do. I offered an apt to come in and he rather a message be sent. Please call back

## 2020-11-13 MED ORDER — OFLOXACIN 0.3 % OP SOLN: 1.0000 [drp] | Freq: Four times a day (QID) | OPHTHALMIC | 1 refills | Status: AC

## 2020-11-13 NOTE — Addendum Note (Signed)
Addended by: Gardiner Sleeper on: 11/13/2020 12:53 PM   Modules accepted: Orders

## 2020-11-24 ENCOUNTER — Encounter (INDEPENDENT_AMBULATORY_CARE_PROVIDER_SITE_OTHER): Payer: Self-pay | Admitting: Ophthalmology

## 2020-11-24 ENCOUNTER — Other Ambulatory Visit: Payer: Self-pay

## 2020-11-24 ENCOUNTER — Ambulatory Visit (INDEPENDENT_AMBULATORY_CARE_PROVIDER_SITE_OTHER): Payer: Medicare Other | Admitting: Ophthalmology

## 2020-11-24 DIAGNOSIS — Z9889 Other specified postprocedural states: Secondary | ICD-10-CM

## 2020-11-24 DIAGNOSIS — H25813 Combined forms of age-related cataract, bilateral: Secondary | ICD-10-CM

## 2020-11-24 DIAGNOSIS — H35372 Puckering of macula, left eye: Secondary | ICD-10-CM | POA: Diagnosis not present

## 2020-11-24 DIAGNOSIS — H35033 Hypertensive retinopathy, bilateral: Secondary | ICD-10-CM

## 2020-11-24 DIAGNOSIS — H3581 Retinal edema: Secondary | ICD-10-CM

## 2020-11-24 DIAGNOSIS — I10 Essential (primary) hypertension: Secondary | ICD-10-CM | POA: Diagnosis not present

## 2020-11-24 DIAGNOSIS — B0233 Zoster keratitis: Secondary | ICD-10-CM | POA: Diagnosis not present

## 2020-11-24 MED ORDER — PREDNISOLONE ACETATE 1 % OP SUSP
1.0000 [drp] | Freq: Four times a day (QID) | OPHTHALMIC | 0 refills | Status: DC
Start: 2020-11-24 — End: 2021-02-24

## 2020-11-24 NOTE — Progress Notes (Signed)
[facility] Note  11/24/2020     CHIEF COMPLAINT Patient presents for Retina Follow Up   HISTORY OF PRESENT ILLNESS: Ralph Stevens is a 70 y.o. male who presents to the clinic today for:   HPI    Retina Follow Up    Patient presents with  Other.  In left eye.  This started weeks ago.  Severity is moderate.  Duration of weeks.  Since onset it is stable.  I, the attending physician,  performed the HPI with the patient and updated documentation appropriately.          Comments    Pt states he has had more swelling and irritation around nasal corner of OS.  Pt states it is better today but still irritated.  Pt ran out of valtrex on Wednesday.       Last edited by Bernarda Caffey, MD on 11/24/2020  9:56 PM. (History)    pt states he is doing a "whole lot better", he states he completed 10 days of Valtrex treatment and is still using Ofloxacin drops   Referring physician: [name], DO [place],  [place] [address]  HISTORICAL INFORMATION:   Selected notes from the MEDICAL RECORD NUMBER Referred by PCP (Dr. Lajuana Ripple) for history of retinal tear OS   CURRENT MEDICATIONS: Current Outpatient Medications (Ophthalmic Drugs)  Medication Sig  . prednisoLONE acetate (PRED FORTE) 1 % ophthalmic suspension Place 1 drop into the left eye 4 (four) times daily.  . Ganciclovir (ZIRGAN) 0.15 % GEL Place 1 drop into the left eye 4 (four) times daily.  Marland Kitchen ofloxacin (OCUFLOX) 0.3 % ophthalmic solution Place 1 drop into the left eye 4 (four) times daily.   No current facility-administered medications for this visit. (Ophthalmic Drugs)   Current Outpatient Medications (Other)  Medication Sig  . alfuzosin (UROXATRAL) 10 MG 24 hr tablet Take 10 mg by mouth daily with breakfast.  . amLODipine (NORVASC) 5 MG tablet TAKE 1 TABLET BY MOUTH EVERY DAY  . aspirin 81 MG chewable tablet Chew 81 mg by mouth in the morning.   Marland Kitchen atorvastatin (LIPITOR) 20 MG  tablet Take 1 tablet (20 mg total) by mouth daily.  Marland Kitchen esomeprazole (NEXIUM) 20 MG capsule Take 20 mg by mouth in the morning.   . fluticasone (FLONASE) 50 MCG/ACT nasal spray Place 1-2 sprays into both nostrils daily as needed for allergies or rhinitis.  Marland Kitchen lisinopril (ZESTRIL) 40 MG tablet Take 1 tablet (40 mg total) by mouth daily.  . metoprolol tartrate (LOPRESSOR) 50 MG tablet Take 1 tablet (50 mg total) by mouth 2 (two) times daily.  . valACYclovir (VALTREX) 1000 MG tablet Take 1 tablet (1,000 mg total) by mouth 3 (three) times daily. (Patient not taking: Reported on 11/24/2020)   No current facility-administered medications for this visit. (Other)      REVIEW OF SYSTEMS: ROS    Positive for: Gastrointestinal, Skin, Musculoskeletal, Eyes   Negative for: Constitutional, Neurological, Genitourinary, HENT, Endocrine, Cardiovascular, Respiratory, Psychiatric, Allergic/Imm, Heme/Lymph   Last edited by Doneen Poisson on 11/24/2020  1:56 PM. (History)       ALLERGIES No Known Allergies  PAST MEDICAL HISTORY Past Medical History:  Diagnosis Date  . Acid reflux   . Arthritis   . Arthritis   . Cataract    Mixed form OU  . Enlarged prostate    PSA goes up and down   . GERD (gastroesophageal reflux disease)   .  High cholesterol   . Hyperlipidemia   . Hypertension   . Hypertensive retinopathy    OU   Past Surgical History:  Procedure Laterality Date  . EYE SURGERY    . TONSILLECTOMY     age 78     FAMILY HISTORY Family History  Problem Relation Age of Onset  . Cancer Mother   . Colon cancer Mother   . Dementia Mother   . Alzheimer's disease Mother   . Emphysema Father   . Hypothyroidism Sister   . Thyroid disease Sister   . Hypertension Sister   . Multiple sclerosis Daughter   . Heart defect Daughter   . Colon cancer Maternal Grandmother   . Heart disease Maternal Grandfather   . Heart disease Paternal Grandmother   . Cancer Paternal Grandfather        unknown (  mouth/throat)  . Colon cancer Maternal Uncle     SOCIAL HISTORY Social History   Tobacco Use  . Smoking status: Never Smoker  . Smokeless tobacco: Never Used  Vaping Use  . Vaping Use: Never used  Substance Use Topics  . Alcohol use: Never  . Drug use: Never         OPHTHALMIC EXAM:  Base Eye Exam    Visual Acuity (Snellen - Linear)      Right Left   Dist cc 20/20 -1 20/25 -2   Dist ph cc  NI       Tonometry (Tonopen, 2:00 PM)      Right Left   Pressure 17 15       Pupils      Dark Light Shape React APD   Right 3 2 Round Minimal 0   Left 3 2 Round Minimal 0       Visual Fields      Left Right    Full Full       Extraocular Movement      Right Left    Full Full       Neuro/Psych    Oriented x3: Yes   Mood/Affect: Normal       Dilation    Both eyes: 1.0% Mydriacyl, 2.5% Phenylephrine @ 2:00 PM        Slit Lamp and Fundus Exam    External Exam      Right Left   External  vesicles on left forehead and scalp -- improving, negative Hutchinson's sign       Slit Lamp Exam      Right Left   Lids/Lashes Dermatochalasis - upper lid, mild Meibomian gland dysfunction 3-4 +periorbital edema -- resolved, no vesicles on  lids   Conjunctiva/Sclera White and quiet White and quiet   Cornea Trace Punctate epithelial erosions, mild Arcus tear film debris, no epi defect, no dendrites, fine KP inferiorly   Anterior Chamber Deep,clear, narrow temporal angle Deep, 0.5+cell/pigment   Iris Round and moderately dilated to 5.47mm Round and moderately dilated to 5.92mm   Lens 2-3+ Nuclear sclerosis, 2-3+ Cortical cataract 2-3+ Nuclear sclerosis, 2-3+ Cortical cataract   Vitreous Vitreous syneresis Vitreous syneresis, clear, no vitritis        Fundus Exam      Right Left   Disc Pink and Sharp Pink and Sharp   C/D Ratio 0.4 0.2   Macula Flat, Good foveal reflex, Retinal pigment epithelial mottling, No heme or edema Flat, blunted foveal reflex, ERM with striae, No heme     Vessels Mild Vascular attenuation, mild Tortuousity Mild Vascular attenuation,  mild Tortuousity   Periphery Attached, No heme  Attached, operculated tear at 1200 with good laser surrounding, No heme         Refraction    Wearing Rx      Sphere Cylinder Axis Add   Right -2.75 +3.75 177 +2.50   Left -1.25 +2.75 173 +2.50          IMAGING AND PROCEDURES  Imaging and Procedures for @TODAY @  OCT, Retina - OU - Both Eyes       Right Eye Quality was good. Central Foveal Thickness: 295. Progression has been stable. Findings include normal foveal contour, no IRF, no SRF.   Left Eye Quality was good. Central Foveal Thickness: 387. Progression has been stable. Findings include abnormal foveal contour, epiretinal membrane, no IRF, no SRF, macular pucker (stable ERM).   Notes *Images captured and stored on drive  Diagnosis / Impression:  OD: NFP, no IRF/SRF OS: +ERM w/ pucker; abnormal foveal profile; no IRF/SRF  Clinical management:  See below  Abbreviations: NFP - Normal foveal profile. CME - cystoid macular edema. PED - pigment epithelial detachment. IRF - intraretinal fluid. SRF - subretinal fluid. EZ - ellipsoid zone. ERM - epiretinal membrane. ORA - outer retinal atrophy. ORT - outer retinal tubulation. 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 Epiretinal membrane (ERM) of left eye  H35.372   3. Retinal edema  H35.81 OCT, Retina - OU - Both Eyes  4. History of repair of retinal tear by laser photocoagulation  Z98.890   5. Essential hypertension  I10   6. Hypertensive retinopathy of both eyes  H35.033   7. Combined forms of age-related cataract of both eyes  H25.813    1. Herpes zoster with ocular involvement OS  - pt diagnosed w/ herpes zoster on 3.21.22 and completed 1g po valtrex TID x10 days  - exam shows interval improvement in periorbital vesicles in V1  dermatome (no eyelid vesicles, negative Hutchinson's sign) and periorbital edema resolved today  - mild conj injection and tr punctate fluorescein staining improved -- pt was prescribed zirgan gel, but could not afford, so used Ofloxacin QID OS  - cornea exam shows mild inf KP OS  - start PredForte qid OS  - f/u 1-2 weeks, sooner prn  2,3. Epiretinal membrane, OS  - likely related to history of RT s/p laser retinopexy (see below)  - mild ERM w/ early pucker -- stable from prior  - asymptomatic, no metamorphopsia  - no indication for surgery at this time  - monitor for now  - f/u 9 mos, DFE, OCT  4. History of retinal tear OS  - operculated tear located at 1200  - s/p laser retinopexy (Dr. Shana Chute, [facility], [place]. [place], [place]) -- good laser surrounding  - stable  - no other RT/RD  5,6. Hypertensive retinopathy OU  - discussed importance of tight BP control  - monitor  7. Mixed form age related cataracts OU  - The symptoms of cataract, surgical options, and treatments and risks were discussed with patient.  - discussed diagnosis and progression  - not yet visually significant  - monitor for now    Ophthalmic Meds Ordered this visit:  Meds ordered this encounter  Medications  . prednisoLONE acetate (PRED FORTE) 1 % ophthalmic suspension    Sig: Place 1  drop into the left eye 4 (four) times daily.    Dispense:  15 mL    Refill:  0       Return for f/u 1-2 weeks, HSK OS, DFE, OCT.  There are no Patient Instructions on file for this visit.   Explained the diagnoses, plan, and follow up with the patient and they expressed understanding.  Patient expressed understanding of the importance of proper follow up care.   This document serves as a record of services personally performed by Gardiner Sleeper, MD, PhD. It was created on their behalf by San Jetty. Owens Shark, OA an ophthalmic technician. The creation of this record is the provider's dictation and/or activities  during the visit.    Electronically signed by: San Jetty. Owens Shark, [place] 04.05.2022 10:31 PM   This document serves as a record of services personally performed by Gardiner Sleeper, MD, PhD. It was created on their behalf by San Jetty. Owens Shark, OA an ophthalmic technician. The creation of this record is the provider's dictation and/or activities during the visit.    Electronically signed by: San Jetty. Owens Shark, [place] 04.05.2022 10:31 PM  Gardiner Sleeper, M.D., Ph.D. Diseases & Surgery of the Retina and Vitreous [facility]  I have reviewed the above documentation for accuracy and completeness, and I agree with the above. Gardiner Sleeper, M.D., Ph.D. 11/24/20 10:31 PM   Abbreviations: M myopia (nearsighted); A astigmatism; H hyperopia (farsighted); P presbyopia; Mrx spectacle prescription;  CTL contact lenses; OD right eye; OS left eye; OU both eyes  XT exotropia; ET esotropia; PEK punctate epithelial keratitis; PEE punctate epithelial erosions; DES dry eye syndrome; MGD meibomian gland dysfunction; ATs artificial tears; PFAT's preservative free artificial tears; [place] nuclear sclerotic cataract; PSC posterior subcapsular cataract; ERM epi-retinal membrane; PVD posterior vitreous detachment; RD retinal detachment; DM diabetes mellitus; DR diabetic retinopathy; NPDR non-proliferative diabetic retinopathy; PDR proliferative diabetic retinopathy; CSME clinically significant macular edema; DME diabetic macular edema; dbh dot blot hemorrhages; CWS cotton wool spot; POAG primary open angle glaucoma; C/D cup-to-disc ratio; HVF humphrey visual field; GVF goldmann visual field; OCT optical coherence tomography; IOP intraocular pressure; BRVO Branch retinal vein occlusion; CRVO central retinal vein occlusion; CRAO central retinal artery occlusion; BRAO branch retinal artery occlusion; RT retinal tear; SB scleral buckle; PPV pars plana vitrectomy; VH Vitreous hemorrhage; PRP panretinal laser  photocoagulation; IVK intravitreal kenalog; VMT vitreomacular traction; MH Macular hole;  NVD neovascularization of the disc; NVE neovascularization elsewhere; AREDS age related eye disease study; ARMD age related macular degeneration; POAG primary open angle glaucoma; EBMD epithelial/anterior basement membrane dystrophy; ACIOL anterior chamber intraocular lens; IOL intraocular lens; PCIOL posterior chamber intraocular lens; Phaco/IOL phacoemulsification with intraocular lens placement; [place] photorefractive keratectomy; LASIK laser assisted in situ keratomileusis; HTN hypertension; DM diabetes mellitus; COPD chronic obstructive pulmonary disease

## 2020-11-26 ENCOUNTER — Encounter (INDEPENDENT_AMBULATORY_CARE_PROVIDER_SITE_OTHER): Payer: Medicare Other | Admitting: Ophthalmology

## 2020-12-02 ENCOUNTER — Ambulatory Visit (INDEPENDENT_AMBULATORY_CARE_PROVIDER_SITE_OTHER): Payer: Medicare Other | Admitting: Family Medicine

## 2020-12-02 ENCOUNTER — Encounter: Payer: Self-pay | Admitting: Family Medicine

## 2020-12-02 ENCOUNTER — Other Ambulatory Visit: Payer: Medicare Other

## 2020-12-02 ENCOUNTER — Other Ambulatory Visit: Payer: Self-pay

## 2020-12-02 ENCOUNTER — Encounter (INDEPENDENT_AMBULATORY_CARE_PROVIDER_SITE_OTHER): Payer: Medicare Other | Admitting: Ophthalmology

## 2020-12-02 ENCOUNTER — Telehealth: Payer: Self-pay | Admitting: *Deleted

## 2020-12-02 DIAGNOSIS — B0229 Other postherpetic nervous system involvement: Secondary | ICD-10-CM | POA: Diagnosis not present

## 2020-12-02 DIAGNOSIS — E782 Mixed hyperlipidemia: Secondary | ICD-10-CM | POA: Diagnosis not present

## 2020-12-02 DIAGNOSIS — R739 Hyperglycemia, unspecified: Secondary | ICD-10-CM

## 2020-12-02 LAB — BAYER DCA HB A1C WAIVED: HB A1C (BAYER DCA - WAIVED): 6.1 % (ref <7.0)

## 2020-12-02 MED ORDER — GABAPENTIN 300 MG PO CAPS: ORAL_CAPSULE | ORAL | 0 refills | Status: DC

## 2020-12-02 NOTE — Telephone Encounter (Signed)
Double book my 3pm and I will do a televisit with him at lunch time since I have not seen him for this issue and it has been >2 weeks since MM saw him.

## 2020-12-02 NOTE — Telephone Encounter (Signed)
Appt made

## 2020-12-02 NOTE — Telephone Encounter (Signed)
Pt was called about labs today and while talking - he states that he is still having a ton of Nerve pain from shingles. Had OV with MMM on 11/09/20. Has had to see EYE Dr as well - shingles got into his eye.   Nerve pain is bad on head/ face. Is there anything he can try for this?

## 2020-12-02 NOTE — Progress Notes (Signed)
Telephone visit  Subjective: CC: Shingles PCP: [name], DO JSH:FWYOV Ralph Stevens is a 70 y.o. male calls for telephone consult today. Patient provides verbal consent for consult held via phone.  Due to COVID-19 pandemic this visit was conducted virtually. This visit type was conducted due to national recommendations for restrictions regarding the COVID-19 Pandemic (e.g. social distancing, sheltering in place) in an effort to limit this patient's exposure and mitigate transmission in our community. All issues noted in this document were discussed and addressed.  A physical exam was not performed with this format.   Location of patient: home Location of provider: [facility] Others present for call: none  1.  Shingles Patient was diagnosed with shingles rash.  Unfortunately it did involve the face and eye and he is now under the care of ophthalmology with plans for repeat evaluation on Monday.  He notes that the rash has essentially healed but he continues to have significant burning, particularly at nighttime when he tries to lay on his side.  He is asking for assistance with this as over-the-counter measures including Neosporin or not helping the pain.   ROS: Per HPI  No Known Allergies Past Medical History:  Diagnosis Date  . Acid reflux   . Arthritis   . Arthritis   . Cataract    Mixed form OU  . Enlarged prostate    PSA goes up and down   . GERD (gastroesophageal reflux disease)   . High cholesterol   . Hyperlipidemia   . Hypertension   . Hypertensive retinopathy    OU    Current Outpatient Medications:  .  alfuzosin (UROXATRAL) 10 MG 24 hr tablet, Take 10 mg by mouth daily with breakfast., Disp: , Rfl:  .  amLODipine (NORVASC) 5 MG tablet, TAKE 1 TABLET BY MOUTH EVERY DAY, Disp: 90 tablet, Rfl: 3 .  aspirin 81 MG chewable tablet, Chew 81 mg by mouth in the morning. , Disp: , Rfl:  .  atorvastatin (LIPITOR) 20 MG tablet, Take 1 tablet (20 mg total) by mouth daily.,  Disp: 90 tablet, Rfl: 3 .  esomeprazole (NEXIUM) 20 MG capsule, Take 20 mg by mouth in the morning. , Disp: , Rfl:  .  fluticasone (FLONASE) 50 MCG/ACT nasal spray, Place 1-2 sprays into both nostrils daily as needed for allergies or rhinitis., Disp: 48 mL, Rfl: 3 .  Ganciclovir (ZIRGAN) 0.15 % GEL, Place 1 drop into the left eye 4 (four) times daily., Disp: 5 g, Rfl: 2 .  lisinopril (ZESTRIL) 40 MG tablet, Take 1 tablet (40 mg total) by mouth daily., Disp: 90 tablet, Rfl: 3 .  metoprolol tartrate (LOPRESSOR) 50 MG tablet, Take 1 tablet (50 mg total) by mouth 2 (two) times daily., Disp: 180 tablet, Rfl: 3 .  ofloxacin (OCUFLOX) 0.3 % ophthalmic solution, Place 1 drop into the left eye 4 (four) times daily., Disp: 10 mL, Rfl: 1 .  prednisoLONE acetate (PRED FORTE) 1 % ophthalmic suspension, Place 1 drop into the left eye 4 (four) times daily., Disp: 15 mL, Rfl: 0 .  valACYclovir (VALTREX) 1000 MG tablet, Take 1 tablet (1,000 mg total) by mouth 3 (three) times daily. (Patient not taking: Reported on 11/24/2020), Disp: 9 tablet, Rfl: 0  Assessment/ Plan: 70 y.o. male   Post herpetic neuralgia - Plan: gabapentin (NEURONTIN) 300 MG capsule  Gabapentin sent in.  Discussed gradually advancing dose.  Caution sedation.  He will follow-up with me if he needs higher doses to help with postherpetic  neuralgia.  Otherwise continue to follow-up with ophthalmology as scheduled for reevaluation  Start time: 11:59am End time: 12:04pm  Total time spent on patient care (including telephone call/ virtual visit): 5 minutes  [place], [place] [phone]

## 2020-12-03 LAB — CMP14+EGFR
ALT: 17 IU/L (ref 0–44)
AST: 14 IU/L (ref 0–40)
Albumin/Globulin Ratio: 1.4 (ref 1.2–2.2)
Albumin: 4.2 g/dL (ref 3.8–4.8)
Alkaline Phosphatase: 70 IU/L (ref 44–121)
BUN/Creatinine Ratio: 15 (ref 10–24)
BUN: 17 mg/dL (ref 8–27)
Bilirubin Total: 0.7 mg/dL (ref 0.0–1.2)
CO2: 21 mmol/L (ref 20–29)
Calcium: 9 mg/dL (ref 8.6–10.2)
Chloride: 106 mmol/L (ref 96–106)
Creatinine, Ser: 1.1 mg/dL (ref 0.76–1.27)
Globulin, Total: 2.9 g/dL (ref 1.5–4.5)
Glucose: 104 mg/dL — ABNORMAL HIGH (ref 65–99)
Potassium: 3.9 mmol/L (ref 3.5–5.2)
Sodium: 141 mmol/L (ref 134–144)
Total Protein: 7.1 g/dL (ref 6.0–8.5)
eGFR: 73 mL/min/{1.73_m2} (ref >59)

## 2020-12-03 LAB — LIPID PANEL
Chol/HDL Ratio: 4.9 ratio (ref 0.0–5.0)
Cholesterol, Total: 158 mg/dL (ref 100–199)
HDL: 32 mg/dL — ABNORMAL LOW (ref >39)
LDL Chol Calc (NIH): 94 mg/dL (ref 0–99)
Triglycerides: 183 mg/dL — ABNORMAL HIGH (ref 0–149)
VLDL Cholesterol Cal: 32 mg/dL (ref 5–40)

## 2020-12-03 NOTE — Progress Notes (Signed)
[facility] Note  12/07/2020     CHIEF COMPLAINT Patient presents for Retina Follow Up   HISTORY OF PRESENT ILLNESS: Ralph Stevens is a 70 y.o. male who presents to the clinic today for:   HPI    Retina Follow Up    Patient presents with  Other.  In left eye.  This started 2 weeks ago.  Since onset it is gradually improving.  I, the attending physician,  performed the HPI with the patient and updated documentation appropriately.          Comments    Pt here for 2 week retinal follow up, herpes zoster OS. Pt states he has improved, feeling better than he has been. Nerve pain is still there but vision has cleared up.        Last edited by Bernarda Caffey, MD on 12/07/2020  4:15 PM. (History)    pt states he is doing a "whole lot better", he states he started gabapentin for nerve pain, but he is allergic to it, he states once he stopped taking it, the nerve pain came back, he is on PF QID  Referring physician: [name], DO [place],  [place] [id number]  HISTORICAL INFORMATION:   Selected notes from the [place] [place] Referred by PCP (Dr. Lajuana Ripple) for history of retinal tear OS   CURRENT MEDICATIONS: Current Outpatient Medications (Ophthalmic Drugs)  Medication Sig  . Ganciclovir (ZIRGAN) 0.15 % GEL Place 1 drop into the left eye 4 (four) times daily.  Marland Kitchen ofloxacin (OCUFLOX) 0.3 % ophthalmic solution Place 1 drop into the left eye 4 (four) times daily.  . prednisoLONE acetate (PRED FORTE) 1 % ophthalmic suspension Place 1 drop into the left eye 4 (four) times daily.   No current facility-administered medications for this visit. (Ophthalmic Drugs)   Current Outpatient Medications (Other)  Medication Sig  . alfuzosin (UROXATRAL) 10 MG 24 hr tablet Take 10 mg by mouth daily with breakfast.  . amLODipine (NORVASC) 5 MG tablet TAKE 1 TABLET BY MOUTH EVERY DAY  . aspirin 81 MG chewable tablet Chew 81 mg by mouth in the  morning.   Marland Kitchen atorvastatin (LIPITOR) 20 MG tablet Take 1 tablet (20 mg total) by mouth daily.  Marland Kitchen esomeprazole (NEXIUM) 20 MG capsule Take 20 mg by mouth in the morning.   . fluticasone (FLONASE) 50 MCG/ACT nasal spray Place 1-2 sprays into both nostrils daily as needed for allergies or rhinitis.  Marland Kitchen gabapentin (NEURONTIN) 300 MG capsule Take 1 capsule (300 mg total) by mouth at bedtime for 3 days, THEN 1 capsule (300 mg total) 2 (two) times daily for 3 days, THEN 1 capsule (300 mg total) 3 (three) times daily. Advance dose as directed only if needed.. (Patient not taking: Reported on 12/07/2020)  . lisinopril (ZESTRIL) 40 MG tablet Take 1 tablet (40 mg total) by mouth daily.  . metoprolol tartrate (LOPRESSOR) 50 MG tablet Take 1 tablet (50 mg total) by mouth 2 (two) times daily.   No current facility-administered medications for this visit. (Other)      REVIEW OF SYSTEMS: ROS    Positive for: Gastrointestinal, Skin, Musculoskeletal, Eyes   Negative for: Constitutional, Neurological, Genitourinary, HENT, Endocrine, Cardiovascular, Respiratory, Psychiatric, Allergic/Imm, Heme/Lymph   Last edited by Kingsley Spittle, COT on 12/07/2020  1:35 PM. (History)       ALLERGIES Allergies  Allergen Reactions  . Gabapentin Hives    PAST MEDICAL HISTORY  Past Medical History:  Diagnosis Date  . Acid reflux   . Arthritis   . Arthritis   . Cataract    Mixed form OU  . Enlarged prostate    PSA goes up and down   . GERD (gastroesophageal reflux disease)   . High cholesterol   . Hyperlipidemia   . Hypertension   . Hypertensive retinopathy    OU   Past Surgical History:  Procedure Laterality Date  . EYE SURGERY    . TONSILLECTOMY     age 55     FAMILY HISTORY Family History  Problem Relation Age of Onset  . Cancer Mother   . Colon cancer Mother   . Dementia Mother   . Alzheimer's disease Mother   . Emphysema Father   . Hypothyroidism Sister   . Thyroid disease Sister   .  Hypertension Sister   . Multiple sclerosis Daughter   . Heart defect Daughter   . Colon cancer Maternal Grandmother   . Heart disease Maternal Grandfather   . Heart disease Paternal Grandmother   . Cancer Paternal Grandfather        unknown ( mouth/throat)  . Colon cancer Maternal Uncle     SOCIAL HISTORY Social History   Tobacco Use  . Smoking status: Never Smoker  . Smokeless tobacco: Never Used  Vaping Use  . Vaping Use: Never used  Substance Use Topics  . Alcohol use: Never  . Drug use: Never         OPHTHALMIC EXAM:  Base Eye Exam    Visual Acuity (Snellen - Linear)      Right Left   Dist cc 20/30 20/25 -1   Dist ph [place] 20/25 NI       Tonometry (Tonopen, 1:42 PM)      Right Left   Pressure 18 18       Pupils      Dark Light Shape React APD   Right 3 2 Round Brisk None   Left 3 2 Round Brisk None       Visual Fields (Counting fingers)      Left Right    Full Full       Extraocular Movement      Right Left    Full, Ortho Full, Ortho       Neuro/Psych    Oriented x3: Yes   Mood/Affect: Normal       Dilation    Both eyes: 1.0% Mydriacyl, 2.5% Phenylephrine @ 1:42 PM        Slit Lamp and Fundus Exam    External Exam      Right Left   External  vesicles on left forehead and scalp -- improving, negative Hutchinson's sign       Slit Lamp Exam      Right Left   Lids/Lashes Dermatochalasis - upper lid, mild Meibomian gland dysfunction 3-4 +periorbital edema -- resolved, no vesicles on  lids   Conjunctiva/Sclera White and quiet White and quiet   Cornea Trace Punctate epithelial erosions, mild Arcus tear film debris, no epi defect, no dendrites, fine KP inferiorly -- resolved   Anterior Chamber Deep,clear, narrow temporal angle Deep, 0.5+cell/pigment   Iris Round and moderately dilated to 5.62mm Round and moderately dilated to 5.74mm   Lens 2-3+ Nuclear sclerosis, 2-3+ Cortical cataract 2-3+ Nuclear sclerosis, 2-3+ Cortical cataract   Vitreous  Vitreous syneresis Vitreous syneresis, clear, no vitritis        Fundus Exam  Right Left   Disc Pink and Sharp Pink and Sharp   C/D Ratio 0.4 0.2   Macula Flat, Good foveal reflex, Retinal pigment epithelial mottling, No heme or edema Flat, good foveal reflex, ERM with striae, No heme    Vessels Mild Vascular attenuation, mild Tortuousity Mild Vascular attenuation, mild Tortuousity   Periphery Attached, No heme  Attached, operculated tear at 1200 with good laser surrounding, No heme         Refraction    Wearing Rx      Sphere Cylinder Axis Add   Right -2.75 +3.75 177 +2.50   Left -1.25 +2.75 173 +2.50          IMAGING AND PROCEDURES  Imaging and Procedures for @TODAY @  OCT, Retina - OU - Both Eyes       Right Eye Quality was good. Central Foveal Thickness: 295. Progression has been stable. Findings include normal foveal contour, no IRF, no SRF.   Left Eye Quality was good. Central Foveal Thickness: 389. Progression has been stable. Findings include abnormal foveal contour, epiretinal membrane, no IRF, no SRF, macular pucker (stable ERM).   Notes *Images captured and stored on drive  Diagnosis / Impression:  OD: NFP, no IRF/SRF OS: +ERM w/ pucker; abnormal foveal profile; no IRF/SRF  Clinical management:  See below  Abbreviations: NFP - Normal foveal profile. CME - cystoid macular edema. PED - pigment epithelial detachment. IRF - intraretinal fluid. SRF - subretinal fluid. EZ - ellipsoid zone. ERM - epiretinal membrane. ORA - outer retinal atrophy. ORT - outer retinal tubulation. 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 Epiretinal membrane (ERM) of left eye  H35.372   3. Retinal edema  H35.81 OCT, Retina - OU - Both Eyes  4. Essential hypertension  I10   5. Hypertensive retinopathy of both eyes  H35.033   6. History of repair of retinal tear by  laser photocoagulation  Z98.890   7. Combined forms of age-related cataract of both eyes  H25.813    1. Herpes zoster with ocular involvement OS  - pt diagnosed w/ herpes zoster on 3.21.22 and completed 1g po valtrex TID x10 days  - exam shows interval improvement in periorbital vesicles in V1 dermatome (no eyelid vesicles, negative Hutchinson's sign) and periorbital edema resolved today  - mild conj injection and tr punctate fluorescein staining improved -- pt was prescribed zirgan gel, but could not afford, so used Ofloxacin QID OS -- pt is no longer using  - cornea exam shows interval improvement in mild inf KP OS -- on PF QID OS  - start PF taper -- 4,3,2,1 drops daily, decrease weekly  - f/u 6-8 weeks, sooner prn  2,3. Epiretinal membrane, OS  - likely related to history of RT s/p laser retinopexy (see below)  - mild ERM w/ early pucker -- stable from prior  - asymptomatic, no metamorphopsia  - no indication for surgery at this time  - monitor for now  - f/u 9 mos, DFE, OCT  4. History of retinal tear OS  - operculated tear located at 1200  - s/p laser retinopexy (Dr. Shana Chute, [facility], [place]. [place], [place]) -- good laser surrounding  - stable  - no other RT/RD  5,6. Hypertensive retinopathy OU  - discussed importance of tight BP control  - monitor  7. Mixed form age related cataracts OU  - The symptoms of cataract, surgical options, and treatments and risks were discussed with patient.  - discussed diagnosis and progression  - not yet visually significant  - monitor for now    Ophthalmic Meds Ordered this visit:  No orders of the defined types were placed in this encounter.      Return for f/u 6-8 weeks, herpes zoster OS, DFE, OCT.  There are no Patient Instructions on file for this visit.   This document serves as a record of services personally performed by Gardiner Sleeper, MD, PhD. It was created on their behalf by Leeann Must, [place], an ophthalmic  technician. The creation of this record is the provider's dictation and/or activities during the visit.    Electronically signed by: Leeann Must, COA @TODAY @ 4:17 PM   This document serves as a record of services personally performed by Gardiner Sleeper, MD, PhD. It was created on their behalf by San Jetty. Owens Shark, OA an ophthalmic technician. The creation of this record is the provider's dictation and/or activities during the visit.    Electronically signed by: San Jetty. Marguerita Merles 04.18.2022 4:17 PM  Gardiner Sleeper, M.D., Ph.D. Diseases & Surgery of the Retina and [place] 12/07/2020   I have reviewed the above documentation for accuracy and completeness, and I agree with the above. Gardiner Sleeper, M.D., Ph.D. 12/07/20 4:17 PM    Abbreviations: M myopia (nearsighted); A astigmatism; H hyperopia (farsighted); P presbyopia; Mrx spectacle prescription;  CTL contact lenses; OD right eye; OS left eye; OU both eyes  XT exotropia; ET esotropia; PEK punctate epithelial keratitis; PEE punctate epithelial erosions; DES dry eye syndrome; MGD meibomian gland dysfunction; ATs artificial tears; PFAT's preservative free artificial tears; [place] nuclear sclerotic cataract; PSC posterior subcapsular cataract; ERM epi-retinal membrane; PVD posterior vitreous detachment; RD retinal detachment; DM diabetes mellitus; DR diabetic retinopathy; NPDR non-proliferative diabetic retinopathy; PDR proliferative diabetic retinopathy; CSME clinically significant macular edema; DME diabetic macular edema; dbh dot blot hemorrhages; CWS cotton wool spot; POAG primary open angle glaucoma; C/D cup-to-disc ratio; HVF humphrey visual field; GVF goldmann visual field; OCT optical coherence tomography; IOP intraocular pressure; BRVO Branch retinal vein occlusion; CRVO central retinal vein occlusion; CRAO central retinal artery occlusion; BRAO branch retinal artery occlusion; RT retinal tear; SB scleral  buckle; PPV pars plana vitrectomy; VH Vitreous hemorrhage; PRP panretinal laser photocoagulation; IVK intravitreal kenalog; VMT vitreomacular traction; MH Macular hole;  NVD neovascularization of the disc; NVE neovascularization elsewhere; AREDS age related eye disease study; ARMD age related macular degeneration; POAG primary open angle glaucoma; EBMD epithelial/anterior basement membrane dystrophy; ACIOL anterior chamber intraocular lens; IOL intraocular lens; PCIOL posterior chamber intraocular lens; Phaco/IOL phacoemulsification with intraocular lens placement; [place] photorefractive keratectomy; LASIK laser assisted in situ keratomileusis; HTN hypertension; DM diabetes mellitus; COPD chronic obstructive pulmonary disease

## 2020-12-07 ENCOUNTER — Encounter (INDEPENDENT_AMBULATORY_CARE_PROVIDER_SITE_OTHER): Payer: Self-pay | Admitting: Ophthalmology

## 2020-12-07 ENCOUNTER — Ambulatory Visit (INDEPENDENT_AMBULATORY_CARE_PROVIDER_SITE_OTHER): Payer: Medicare Other | Admitting: Ophthalmology

## 2020-12-07 ENCOUNTER — Other Ambulatory Visit: Payer: Self-pay

## 2020-12-07 DIAGNOSIS — Z9889 Other specified postprocedural states: Secondary | ICD-10-CM | POA: Diagnosis not present

## 2020-12-07 DIAGNOSIS — H25813 Combined forms of age-related cataract, bilateral: Secondary | ICD-10-CM | POA: Diagnosis not present

## 2020-12-07 DIAGNOSIS — H35033 Hypertensive retinopathy, bilateral: Secondary | ICD-10-CM | POA: Diagnosis not present

## 2020-12-07 DIAGNOSIS — I10 Essential (primary) hypertension: Secondary | ICD-10-CM

## 2020-12-07 DIAGNOSIS — H3581 Retinal edema: Secondary | ICD-10-CM

## 2020-12-07 DIAGNOSIS — H35372 Puckering of macula, left eye: Secondary | ICD-10-CM | POA: Diagnosis not present

## 2020-12-07 DIAGNOSIS — B0233 Zoster keratitis: Secondary | ICD-10-CM | POA: Diagnosis not present

## 2020-12-16 ENCOUNTER — Ambulatory Visit: Payer: Medicare Other | Admitting: Gastroenterology

## 2020-12-21 ENCOUNTER — Encounter: Payer: Self-pay | Admitting: Family Medicine

## 2020-12-21 ENCOUNTER — Telehealth (INDEPENDENT_AMBULATORY_CARE_PROVIDER_SITE_OTHER): Payer: Medicare Other | Admitting: Family Medicine

## 2020-12-21 DIAGNOSIS — U071 COVID-19: Secondary | ICD-10-CM

## 2020-12-21 MED ORDER — CETIRIZINE HCL 5 MG PO TABS: 5.0000 mg | ORAL_TABLET | Freq: Every day | ORAL | 1 refills | Status: DC

## 2020-12-21 MED ORDER — CHLORPHEN-PE-ACETAMINOPHEN 4-10-325 MG PO TABS: 1.0000 | ORAL_TABLET | Freq: Four times a day (QID) | ORAL | 0 refills | Status: DC | PRN

## 2020-12-21 NOTE — Progress Notes (Signed)
Virtual Visit via video Note   Due to COVID-19 pandemic this visit was conducted virtually. This visit type was conducted due to national recommendations for restrictions regarding the COVID-19 Pandemic (e.g. social distancing, sheltering in place) in an effort to limit this patient's exposure and mitigate transmission in our community. All issues noted in this document were discussed and addressed.  A physical exam was not performed with this format.  I connected with  Ralph Stevens  on 12/21/20 at 1535 by video and verified that I am speaking with the correct person using two identifiers. Ralph Stevens is currently located at home and no one is currently with him during the visit. The provider, Gwenlyn Perking, FNP is located in their office at time of visit.  I discussed the limitations, risks, security and privacy concerns of performing an evaluation and management service by video  and the availability of in person appointments. I also discussed with the patient that there may be a patient responsible charge related to this service. The patient expressed understanding and agreed to proceed.  CC: Covid  History and Present Illness: Ralph Stevens was exposed to Covid by a neighbor who tested positive last Tuesday. Ralph Stevens started to have some symptoms on Thursday. He took a home Covid test that was negative. On Friday he took another home Covid test and it was positive. He reports nasal congestion, runny nose, scratchy throat, and dry cough. He denies fever, shortness of breath, chest pain, or fatigue. Overall his symptoms have been improving. He is taking mucinex, vitamin C, zinc, flonase, and tylenol with little relief.  He has been vaccinated and boosted. He denies nausea, vomiting, or diarrhea. He is eating well and staying well hydrated.    ROS As per HPI.     Observations/Objective: Alert and oriented x 3. Able to speak in full sentences without difficulty. No obvious distress.  Non labored respirations.   Assessment and Plan: Ralph Stevens was seen today for covid positive.  Diagnoses and all orders for this visit:  COVID Symptoms for 5 days now. Try norel AD as below instead of Mucinex. Start zyrtec daily as well. Continue flonase, vitamin C, and zinc. Tylenol as needed for fever or pain. Quarantine for 10 days from start of symptoms.  -     Chlorphen-PE-Acetaminophen 4-10-325 MG TABS; Take 1 tablet by mouth every 6 (six) hours as needed. -     cetirizine (ZYRTEC) 5 MG tablet; Take 1 tablet (5 mg total) by mouth daily.     Follow Up Instructions: Return to office for new or worsening symptoms, or if symptoms persist.     I discussed the assessment and treatment plan with the patient. The patient was provided an opportunity to ask questions and all were answered. The patient agreed with the plan and demonstrated an understanding of the instructions.   The patient was advised to call back or seek an in-person evaluation if the symptoms worsen or if the condition fails to improve as anticipated.  The above assessment and management plan was discussed with the patient. The patient verbalized understanding of and has agreed to the management plan. Patient is aware to call the clinic if symptoms persist or worsen. Patient is aware when to return to the clinic for a follow-up visit. Patient educated on when it is appropriate to go to the emergency department.   Time call ended: 1549  I provided 14 minutes of face-to-face time during this encounter.    Erica Osuna M  Lilia Pro, [place]

## 2021-01-04 ENCOUNTER — Other Ambulatory Visit: Payer: Self-pay

## 2021-01-04 ENCOUNTER — Encounter: Payer: Self-pay | Admitting: Family Medicine

## 2021-01-04 ENCOUNTER — Ambulatory Visit (INDEPENDENT_AMBULATORY_CARE_PROVIDER_SITE_OTHER): Payer: Medicare Other | Admitting: Family Medicine

## 2021-01-04 DIAGNOSIS — R399 Unspecified symptoms and signs involving the genitourinary system: Secondary | ICD-10-CM

## 2021-01-04 LAB — URINALYSIS, COMPLETE
Bilirubin, UA: NEGATIVE
Glucose, UA: NEGATIVE
Ketones, UA: NEGATIVE
Leukocytes,UA: NEGATIVE
Nitrite, UA: NEGATIVE
Protein,UA: NEGATIVE
RBC, UA: NEGATIVE
Specific Gravity, UA: 1.025 (ref 1.005–1.030)
Urobilinogen, Ur: 0.2 mg/dL (ref 0.2–1.0)
pH, UA: 5.5 (ref 5.0–7.5)

## 2021-01-04 LAB — MICROSCOPIC EXAMINATION
Bacteria, UA: NONE SEEN
Epithelial Cells (non renal): NONE SEEN /hpf (ref 0–10)
RBC, Urine: NONE SEEN /hpf (ref 0–2)

## 2021-01-04 NOTE — Progress Notes (Signed)
   Virtual Visit  Note Due to COVID-19 pandemic this visit was conducted virtually. This visit type was conducted due to national recommendations for restrictions regarding the COVID-19 Pandemic (e.g. social distancing, sheltering in place) in an effort to limit this patient's exposure and mitigate transmission in our community. All issues noted in this document were discussed and addressed.  A physical exam was not performed with this format.  I connected with Ralph Stevens on 01/04/21 at [phone] by telephone and verified that I am speaking with the correct person using two identifiers. Ralph Stevens is currently located at home and his wife is currently with him during the visit. The provider, Gwenlyn Perking, FNP is located in their office at time of visit.  I discussed the limitations, risks, security and privacy concerns of performing an evaluation and management service by telephone and the availability of in person appointments. I also discussed with the patient that there may be a patient responsible charge related to this service. The patient expressed understanding and agreed to proceed.  CC: UTI symptoms  History and Present Illness:  HPI  Ralph Stevens reports UTI symptoms x 3 days. He reports dysuria and frequency. He denies fever, chills, myalgias, nausea, vomiting, abdominal pain, or flank pain. He reports that he was hospitalized last year for sepsis due to a UTI.     ROS  As per HPI.    Observations/Objective: Alert and oriented x 3. Able to speak in full sentences without difficulty.    Assessment and Plan: Ralph Stevens was seen today for urinary tract infection.  Diagnoses and all orders for this visit:  UTI symptoms Patient will bring urine specimen for testing as below. Will notify patient of results and will prescribe antibiotics if indicated. Stay well hydrated. Return to office for new or worsening symptoms, or if symptoms persist.  -     Urinalysis, Complete;  Future -     Urine Culture; Future     Follow Up Instructions: As needed.     I discussed the assessment and treatment plan with the patient. The patient was provided an opportunity to ask questions and all were answered. The patient agreed with the plan and demonstrated an understanding of the instructions.   The patient was advised to call back or seek an in-person evaluation if the symptoms worsen or if the condition fails to improve as anticipated.  The above assessment and management plan was discussed with the patient. The patient verbalized understanding of and has agreed to the management plan. Patient is aware to call the clinic if symptoms persist or worsen. Patient is aware when to return to the clinic for a follow-up visit. Patient educated on when it is appropriate to go to the emergency department.   Time call ended:  0933  I provided 11 minutes of  non face-to-face time during this encounter.    Gwenlyn Perking, FNP

## 2021-01-06 LAB — URINE CULTURE

## 2021-01-12 ENCOUNTER — Other Ambulatory Visit: Payer: Self-pay | Admitting: Family Medicine

## 2021-01-12 DIAGNOSIS — U071 COVID-19: Secondary | ICD-10-CM

## 2021-01-19 ENCOUNTER — Encounter (INDEPENDENT_AMBULATORY_CARE_PROVIDER_SITE_OTHER): Payer: Self-pay | Admitting: Ophthalmology

## 2021-01-19 ENCOUNTER — Other Ambulatory Visit: Payer: Self-pay

## 2021-01-19 ENCOUNTER — Ambulatory Visit (INDEPENDENT_AMBULATORY_CARE_PROVIDER_SITE_OTHER): Payer: Medicare Other | Admitting: Ophthalmology

## 2021-01-19 DIAGNOSIS — I10 Essential (primary) hypertension: Secondary | ICD-10-CM

## 2021-01-19 DIAGNOSIS — H35033 Hypertensive retinopathy, bilateral: Secondary | ICD-10-CM

## 2021-01-19 DIAGNOSIS — H3581 Retinal edema: Secondary | ICD-10-CM

## 2021-01-19 DIAGNOSIS — H25813 Combined forms of age-related cataract, bilateral: Secondary | ICD-10-CM

## 2021-01-19 DIAGNOSIS — B0233 Zoster keratitis: Secondary | ICD-10-CM

## 2021-01-19 DIAGNOSIS — Z9889 Other specified postprocedural states: Secondary | ICD-10-CM | POA: Diagnosis not present

## 2021-01-19 DIAGNOSIS — H35372 Puckering of macula, left eye: Secondary | ICD-10-CM

## 2021-01-19 NOTE — Progress Notes (Signed)
[facility] Note  01/19/2021     CHIEF COMPLAINT Patient presents for Retina Follow Up   HISTORY OF PRESENT ILLNESS: Ralph Stevens is a 70 y.o. male who presents to the clinic today for:  HPI    Retina Follow Up    Patient presents with  Other.  In both eyes.  This started 6 weeks ago.  Since onset it is stable.  I, the attending physician,  performed the HPI with the patient and updated documentation appropriately.          Comments    Pt here for 6 wk retinal follow up Herpes Zoster OS. Pt states vision is the same, no changes or ocular complaints.       Last edited by Bernarda Caffey, MD on 01/21/2021 10:41 PM. (History)      Referring physician: [name], DO [place],  [place] [address]  HISTORICAL INFORMATION:   Selected notes from the MEDICAL RECORD NUMBER Referred by PCP (Dr. Lajuana Ripple) for history of retinal tear OS   CURRENT MEDICATIONS: Current Outpatient Medications (Ophthalmic Drugs)  Medication Sig  . Ganciclovir (ZIRGAN) 0.15 % GEL Place 1 drop into the left eye 4 (four) times daily.  . prednisoLONE acetate (PRED FORTE) 1 % ophthalmic suspension Place 1 drop into the left eye 4 (four) times daily.   No current facility-administered medications for this visit. (Ophthalmic Drugs)   Current Outpatient Medications (Other)  Medication Sig  . alfuzosin (UROXATRAL) 10 MG 24 hr tablet Take 10 mg by mouth daily with breakfast.  . amLODipine (NORVASC) 5 MG tablet TAKE 1 TABLET BY MOUTH EVERY DAY  . aspirin 81 MG chewable tablet Chew 81 mg by mouth in the morning.   Marland Kitchen atorvastatin (LIPITOR) 20 MG tablet Take 1 tablet (20 mg total) by mouth daily.  . cetirizine (ZYRTEC) 5 MG tablet Take 1 tablet (5 mg total) by mouth daily.  . Chlorphen-PE-Acetaminophen 4-10-325 MG TABS Take 1 tablet by mouth every 6 (six) hours as needed.  Marland Kitchen esomeprazole (NEXIUM) 20 MG capsule Take 20 mg by mouth in the morning.   . fluticasone  (FLONASE) 50 MCG/ACT nasal spray Place 1-2 sprays into both nostrils daily as needed for allergies or rhinitis.  Marland Kitchen gabapentin (NEURONTIN) 300 MG capsule Take 1 capsule (300 mg total) by mouth at bedtime for 3 days, THEN 1 capsule (300 mg total) 2 (two) times daily for 3 days, THEN 1 capsule (300 mg total) 3 (three) times daily. Advance dose as directed only if needed.. (Patient not taking: Reported on 12/07/2020)  . lisinopril (ZESTRIL) 40 MG tablet Take 1 tablet (40 mg total) by mouth daily.  . metoprolol tartrate (LOPRESSOR) 50 MG tablet Take 1 tablet (50 mg total) by mouth 2 (two) times daily.   No current facility-administered medications for this visit. (Other)      REVIEW OF SYSTEMS: ROS    Positive for: Gastrointestinal, Skin, Musculoskeletal, Eyes   Negative for: Constitutional, Neurological, Genitourinary, HENT, Endocrine, Cardiovascular, Respiratory, Psychiatric, Allergic/Imm, Heme/Lymph   Last edited by Kingsley Spittle, COT on 01/19/2021  2:06 PM. (History)       ALLERGIES Allergies  Allergen Reactions  . Gabapentin Hives    PAST MEDICAL HISTORY Past Medical History:  Diagnosis Date  . Acid reflux   . Arthritis   . Arthritis   . Cataract    Mixed form OU  . Enlarged prostate    PSA goes up and  down   . GERD (gastroesophageal reflux disease)   . High cholesterol   . Hyperlipidemia   . Hypertension   . Hypertensive retinopathy    OU   Past Surgical History:  Procedure Laterality Date  . EYE SURGERY    . TONSILLECTOMY     age 36     FAMILY HISTORY Family History  Problem Relation Age of Onset  . Cancer Mother   . Colon cancer Mother   . Dementia Mother   . Alzheimer's disease Mother   . Emphysema Father   . Hypothyroidism Sister   . Thyroid disease Sister   . Hypertension Sister   . Multiple sclerosis Daughter   . Heart defect Daughter   . Colon cancer Maternal Grandmother   . Heart disease Maternal Grandfather   . Heart disease Paternal  Grandmother   . Cancer Paternal Grandfather        unknown ( mouth/throat)  . Colon cancer Maternal Uncle     SOCIAL HISTORY Social History   Tobacco Use  . Smoking status: Never Smoker  . Smokeless tobacco: Never Used  Vaping Use  . Vaping Use: Never used  Substance Use Topics  . Alcohol use: Never  . Drug use: Never         OPHTHALMIC EXAM:  Base Eye Exam    Visual Acuity (Snellen - Linear)      Right Left   Dist cc 20/20 -1 20/25 -2       Tonometry (Tonopen, 2:12 PM)      Right Left   Pressure 18 13       Pupils      Dark Light Shape React APD   Right 3 2 Round Brisk None   Left 3 2 Round Brisk None       Visual Fields (Counting fingers)      Left Right    Full Full       Extraocular Movement      Right Left    Full, Ortho Full, Ortho       Neuro/Psych    Oriented x3: Yes   Mood/Affect: Normal       Dilation    Both eyes: 1.0% Mydriacyl, 2.5% Phenylephrine @ 2:12 PM        Slit Lamp and Fundus Exam    External Exam      Right Left   External  vesicles on left forehead and scalp -- improving, negative Hutchinson's sign       Slit Lamp Exam      Right Left   Lids/Lashes Dermatochalasis - upper lid, mild Meibomian gland dysfunction Dermatochalasis - upper lid, Mild Meibomian gland dysfunction, Telangiectasia   Conjunctiva/Sclera White and quiet White and quiet   Cornea Trace Punctate epithelial erosions, mild Arcus tear film debris, no epi defect, no dendrites, fine KP inferiorly -- resolved   Anterior Chamber Deep,clear, narrow temporal angle Deep and clear, no cell or pigment   Iris Round and moderately dilated to 5.65mm Round and moderately dilated to 5.41mm   Lens 2-3+ Nuclear sclerosis, 2-3+ Cortical cataract 2-3+ Nuclear sclerosis, 2-3+ Cortical cataract   Vitreous Vitreous syneresis Vitreous syneresis, clear, no vitritis        Fundus Exam      Right Left   Disc Pink and Sharp Pink and Sharp   C/D Ratio 0.4 0.3   Macula Flat, Good  foveal reflex, Retinal pigment epithelial mottling, No heme or edema Flat, good foveal reflex, ERM with early  striae, No heme    Vessels Mild Vascular attenuation, mild Tortuousity Mild Vascular attenuation, mild Tortuousity   Periphery Attached, No heme  Attached, operculated tear at 1200 with good laser surrounding, No heme         Refraction    Wearing Rx      Sphere Cylinder Axis Add   Right -2.75 +3.75 177 +2.50   Left -1.25 +2.75 173 +2.50          IMAGING AND PROCEDURES  Imaging and Procedures for @TODAY @  OCT, Retina - OU - Both Eyes       Right Eye Quality was good. Central Foveal Thickness: 295. Progression has been stable. Findings include normal foveal contour, no IRF, no SRF.   Left Eye Quality was good. Central Foveal Thickness: 411. Progression has been stable. Findings include abnormal foveal contour, epiretinal membrane, no IRF, no SRF, macular pucker (stable ERM).   Notes *Images captured and stored on drive  Diagnosis / Impression:  OD: NFP, no IRF/SRF OS: +ERM w/ pucker; abnormal foveal profile; no IRF/SRF  Clinical management:  See below  Abbreviations: NFP - Normal foveal profile. CME - cystoid macular edema. PED - pigment epithelial detachment. IRF - intraretinal fluid. SRF - subretinal fluid. EZ - ellipsoid zone. ERM - epiretinal membrane. ORA - outer retinal atrophy. ORT - outer retinal tubulation. 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 Epiretinal membrane (ERM) of left eye  H35.372   3. Retinal edema  H35.81 OCT, Retina - OU - Both Eyes  4. History of repair of retinal tear by laser photocoagulation  Z98.890   5. Essential hypertension  I10   6. Hypertensive retinopathy of both eyes  H35.033   7. Combined forms of age-related cataract of both eyes  H25.813    1. Herpes zoster with ocular involvement OS -- resolved  - pt  diagnosed w/ herpes zoster on 3.21.22 and completed 1g po valtrex TID x10 days  - exam shows stable improvement in periorbital vesicles in V1 dermatome (no eyelid vesicles, negative Hutchinson's sign) and periorbital edema resolved today  - mild conj injection and tr punctate fluorescein staining improved -- pt was prescribed zirgan gel, but could not afford, so used Ofloxacin QID OS -- pt is no longer using  - cornea exam shows stable improvement in mild inf KP OS -- resolved  2,3. Epiretinal membrane, OS  - likely related to history of RT s/p laser retinopexy (see below)  - mild ERM w/ early pucker -- stable from prior  - asymptomatic, no metamorphopsia  - no indication for surgery at this time  - monitor for now  - f/u 3-4 mos, DFE, OCT  4. History of retinal tear OS  - operculated tear located at 1200  - s/p laser retinopexy (Dr. Shana Chute, [facility], [place], [place]) -- good laser surrounding  - stable  - no other RT/RD  5,6. Hypertensive retinopathy OU  - discussed importance of tight BP control  - monitor  7. Mixed form age related cataracts OU  - The symptoms of cataract, surgical options, and treatments and risks were discussed with patient.  - discussed diagnosis and progression  - not yet visually significant  - monitor for now    Ophthalmic Meds Ordered this visit:  No orders of the defined types were  placed in this encounter.      Return Return 3-4 months, for DFE, OCT.  There are no Patient Instructions on file for this visit.  This document serves as a record of services personally performed by Gardiner Sleeper, MD, PhD. It was created on their behalf by Leonie Douglas, an ophthalmic technician. The creation of this record is the provider's dictation and/or activities during the visit.    Electronically signed by: Leonie Douglas COA, 01/21/21  10:51 PM  This document serves as a record of services personally performed by Gardiner Sleeper, MD, PhD. It was  created on their behalf by Estill Bakes, COT an ophthalmic technician. The creation of this record is the provider's dictation and/or activities during the visit.    Electronically signed by: Estill Bakes, COT 5.31.22 @ 10:51 PM  Gardiner Sleeper, M.D., Ph.D. Diseases & Surgery of the Retina and [place] 01/19/2021  I have reviewed the above documentation for accuracy and completeness, and I agree with the above. Gardiner Sleeper, M.D., Ph.D. 01/21/21 10:52 PM   Abbreviations: M myopia (nearsighted); A astigmatism; H hyperopia (farsighted); P presbyopia; Mrx spectacle prescription;  CTL contact lenses; OD right eye; OS left eye; OU both eyes  XT exotropia; ET esotropia; PEK punctate epithelial keratitis; PEE punctate epithelial erosions; DES dry eye syndrome; MGD meibomian gland dysfunction; ATs artificial tears; PFAT's preservative free artificial tears; [place] nuclear sclerotic cataract; PSC posterior subcapsular cataract; ERM epi-retinal membrane; PVD posterior vitreous detachment; RD retinal detachment; DM diabetes mellitus; DR diabetic retinopathy; NPDR non-proliferative diabetic retinopathy; PDR proliferative diabetic retinopathy; CSME clinically significant macular edema; DME diabetic macular edema; dbh dot blot hemorrhages; CWS cotton wool spot; POAG primary open angle glaucoma; C/D cup-to-disc ratio; HVF humphrey visual field; GVF goldmann visual field; OCT optical coherence tomography; IOP intraocular pressure; BRVO Branch retinal vein occlusion; CRVO central retinal vein occlusion; CRAO central retinal artery occlusion; BRAO branch retinal artery occlusion; RT retinal tear; SB scleral buckle; PPV pars plana vitrectomy; VH Vitreous hemorrhage; PRP panretinal laser photocoagulation; IVK intravitreal kenalog; VMT vitreomacular traction; MH Macular hole;  NVD neovascularization of the disc; NVE neovascularization elsewhere; AREDS age related eye disease study; ARMD  age related macular degeneration; POAG primary open angle glaucoma; EBMD epithelial/anterior basement membrane dystrophy; ACIOL anterior chamber intraocular lens; IOL intraocular lens; PCIOL posterior chamber intraocular lens; Phaco/IOL phacoemulsification with intraocular lens placement; [place] photorefractive keratectomy; LASIK laser assisted in situ keratomileusis; HTN hypertension; DM diabetes mellitus; COPD chronic obstructive pulmonary disease

## 2021-01-21 ENCOUNTER — Encounter (INDEPENDENT_AMBULATORY_CARE_PROVIDER_SITE_OTHER): Payer: Self-pay | Admitting: Ophthalmology

## 2021-02-10 ENCOUNTER — Encounter: Payer: Self-pay | Admitting: Gastroenterology

## 2021-02-10 ENCOUNTER — Ambulatory Visit: Payer: Medicare Other | Admitting: Gastroenterology

## 2021-02-10 ENCOUNTER — Other Ambulatory Visit: Payer: Self-pay

## 2021-02-10 VITALS — BP 143/83 | HR 83 | Temp 97.3°F | Ht 72.0 in | Wt 191.6 lb

## 2021-02-10 DIAGNOSIS — K648 Other hemorrhoids: Secondary | ICD-10-CM | POA: Diagnosis not present

## 2021-02-10 NOTE — Progress Notes (Signed)
[place] Banding Note:   Ralph Stevens is a very pleasant 70 year old male presenting with history of chronic symptomatic hemorrhoids. Last colonoscopy in 2019 in [place], with findings of internal hemorrhoids and pancolonic diverticulosis. Surveillance due in 2024 due to history of adenomatous colon polyps and family history. He notes chronic intermittent constipation, bleeding, prolapse, and denies rectal pain, itching, or burning.   The patient presents with symptomatic grade 2 hemorrhoids, unresponsive to maximal medical therapy, requesting rubber band ligation of his hemorrhoidal disease. All risks, benefits, and alternative forms of therapy were described and informed consent was obtained.  The decision was made to band the right posterior  internal hemorrhoid, and the [place] was used to perform band ligation without complication. Digital anorectal examination was then performed to assure proper positioning of the band, and to adjust the banded tissue as required. The patient was discharged home without pain or other issues. Dietary and behavioral recommendations were given and (if necessary prescriptions were given), along with follow-up instructions. The patient will return on July 6th at 8am  for followup and possible additional banding as required. I have asked him to increase water intake and fiber.   No complications were encountered and the patient tolerated the procedure well   Annitta Needs, PhD, [facility] Gastroenterology

## 2021-02-10 NOTE — Patient Instructions (Signed)
I recommend adding Benefiber 2 teaspoons daily and increasing to 3 times a day mixed in your beverage of choice to help facilitate more productive bowel movements and avoid constipation.   Limit toilet time to 2-3 minutes and avoid straining.  We will make a slot for 7/6 at 0800 in morning to do second banding!  It was a pleasure to see you today. I want to create trusting relationships with patients to provide genuine, compassionate, and quality care. I value your feedback. If you receive a survey regarding your visit,  I greatly appreciate you taking time to fill this out.   Annitta Needs, PhD, ANP-BC [facility] Gastroenterology

## 2021-02-19 ENCOUNTER — Other Ambulatory Visit: Payer: Self-pay | Admitting: Family Medicine

## 2021-02-24 ENCOUNTER — Other Ambulatory Visit: Payer: Self-pay

## 2021-02-24 ENCOUNTER — Telehealth: Payer: Self-pay | Admitting: Gastroenterology

## 2021-02-24 ENCOUNTER — Ambulatory Visit: Payer: Medicare Other | Admitting: Gastroenterology

## 2021-02-24 ENCOUNTER — Encounter: Payer: Self-pay | Admitting: Gastroenterology

## 2021-02-24 VITALS — BP 137/85 | HR 77 | Temp 97.7°F | Ht 72.0 in | Wt 193.4 lb

## 2021-02-24 DIAGNOSIS — K648 Other hemorrhoids: Secondary | ICD-10-CM

## 2021-02-24 NOTE — Patient Instructions (Signed)
Continue to avoid straining, limit toilet time, and avoid constipation as you are doing.  I will see you back in a few weeks!  Please call with any concerns in the meantime!  I enjoyed seeing you again today! As you know, I value our relationship and want to provide genuine, compassionate, and quality care. I welcome your feedback. If you receive a survey regarding your visit,  I greatly appreciate you taking time to fill this out. See you next time!  Annitta Needs, PhD, ANP-BC [facility] Gastroenterology

## 2021-02-24 NOTE — Progress Notes (Signed)
[place] Banding Note:   Ralph Stevens is a very pleasant 70 year old male presenting with history of chronic symptomatic hemorrhoids. Last colonoscopy in 2019 in [place], with findings of internal hemorrhoids and pancolonic diverticulosis. Surveillance due in 2024 due to history of adenomatous colon polyps and family history. First banding completed of right posterior a few weeks ago. He has noted improvement in rectal bleeding and pressure. Minor bleeding recently but much improved.   The patient presents with symptomatic grade 3 hemorrhoids, unresponsive to maximal medical therapy, requesting rubber band ligation of his/her hemorrhoidal disease. All risks, benefits, and alternative forms of therapy were described and informed consent was obtained.  The decision was made to band the left lateral internal hemorrhoid, and the [place] was used to perform band ligation without complication. Digital anorectal examination was then performed to assure proper positioning of the band, and to adjust the banded tissue as required. The patient was discharged home without pain or other issues. Dietary and behavioral recommendations were given and (if necessary prescriptions were given), along with follow-up instructions. The patient will return on July 28th at 11 am (opening a slot) for followup and possible additional banding as required.  No complications were encountered and the patient tolerated the procedure well.  Annitta Needs, PhD, ANP-BC [facility] Gastroenterology

## 2021-02-24 NOTE — Telephone Encounter (Signed)
Reba, can you please open up 7/28 at 11 am and put for BANDING only? This patient needs his final banding and I can see him during this time if it's opened up. Thank you!

## 2021-03-03 ENCOUNTER — Ambulatory Visit (INDEPENDENT_AMBULATORY_CARE_PROVIDER_SITE_OTHER): Payer: Medicare Other | Admitting: Family Medicine

## 2021-03-03 ENCOUNTER — Other Ambulatory Visit: Payer: Self-pay

## 2021-03-03 ENCOUNTER — Encounter: Payer: Self-pay | Admitting: Family Medicine

## 2021-03-03 VITALS — BP 142/87 | HR 80 | Temp 98.6°F | Ht 72.0 in | Wt 191.0 lb

## 2021-03-03 DIAGNOSIS — Z0001 Encounter for general adult medical examination with abnormal findings: Secondary | ICD-10-CM | POA: Diagnosis not present

## 2021-03-03 DIAGNOSIS — I10 Essential (primary) hypertension: Secondary | ICD-10-CM

## 2021-03-03 DIAGNOSIS — Z0000 Encounter for general adult medical examination without abnormal findings: Secondary | ICD-10-CM

## 2021-03-03 DIAGNOSIS — E782 Mixed hyperlipidemia: Secondary | ICD-10-CM

## 2021-03-03 DIAGNOSIS — B0229 Other postherpetic nervous system involvement: Secondary | ICD-10-CM | POA: Diagnosis not present

## 2021-03-03 DIAGNOSIS — K648 Other hemorrhoids: Secondary | ICD-10-CM

## 2021-03-03 LAB — LIPID PANEL
Chol/HDL Ratio: 4.8 ratio (ref 0.0–5.0)
Cholesterol, Total: 152 mg/dL (ref 100–199)
HDL: 32 mg/dL — ABNORMAL LOW (ref >39)
LDL Chol Calc (NIH): 95 mg/dL (ref 0–99)
Triglycerides: 142 mg/dL (ref 0–149)
VLDL Cholesterol Cal: 25 mg/dL (ref 5–40)

## 2021-03-03 MED ORDER — AMLODIPINE BESYLATE 5 MG PO TABS: 5.0000 mg | ORAL_TABLET | Freq: Every day | ORAL | 3 refills | Status: DC

## 2021-03-03 MED ORDER — METOPROLOL TARTRATE 50 MG PO TABS: 50.0000 mg | ORAL_TABLET | Freq: Two times a day (BID) | ORAL | 3 refills | Status: DC

## 2021-03-03 MED ORDER — FLUTICASONE PROPIONATE 50 MCG/ACT NA SUSP: 1.0000 | Freq: Every day | NASAL | 3 refills | Status: AC | PRN

## 2021-03-03 MED ORDER — LISINOPRIL 40 MG PO TABS: 40.0000 mg | ORAL_TABLET | Freq: Every day | ORAL | 3 refills | Status: DC

## 2021-03-03 MED ORDER — ATORVASTATIN CALCIUM 20 MG PO TABS: 20.0000 mg | ORAL_TABLET | Freq: Every day | ORAL | 3 refills | Status: DC

## 2021-03-03 NOTE — Patient Instructions (Signed)
Lets wait until Sept until you do Shingles vaccination.  Preventive Care 50 Years and Older, Male Preventive care refers to lifestyle choices and visits with your health care provider that can promote health and wellness. This includes: A yearly physical exam. This is also called an annual wellness visit. Regular dental and eye exams. Immunizations. Screening for certain conditions. Healthy lifestyle choices, such as: Eating a healthy diet. Getting regular exercise. Not using drugs or products that contain nicotine and tobacco. Limiting alcohol use. What can I expect for my preventive care visit? Physical exam Your health care provider will check your: Height and weight. These may be used to calculate your BMI (body mass index). BMI is a measurement that tells if you are at a healthy weight. Heart rate and blood pressure. Body temperature. Skin for abnormal spots. Counseling Your health care provider may ask you questions about your: Past medical problems. Family's medical history. Alcohol, tobacco, and drug use. Emotional well-being. Home life and relationship well-being. Sexual activity. Diet, exercise, and sleep habits. History of falls. Memory and ability to understand (cognition). Work and work Statistician. Access to firearms. What immunizations do I need?  Vaccines are usually given at various ages, according to a schedule. Your health care provider will recommend vaccines for you based on your age, medicalhistory, and lifestyle or other factors, such as travel or where you work. What tests do I need? Blood tests Lipid and cholesterol levels. These may be checked every 5 years, or more often depending on your overall health. Hepatitis C test. Hepatitis B test. Screening Lung cancer screening. You may have this screening every year starting at age 3 if you have a 30-pack-year history of smoking and currently smoke or have quit within the past 15 years. Colorectal  cancer screening. All adults should have this screening starting at age 23 and continuing until age 64. Your health care provider may recommend screening at age 15 if you are at increased risk. You will have tests every 1-10 years, depending on your results and the type of screening test. Prostate cancer screening. Recommendations will vary depending on your family history and other risks. Genital exam to check for testicular cancer or hernias. Diabetes screening. This is done by checking your blood sugar (glucose) after you have not eaten for a while (fasting). You may have this done every 1-3 years. Abdominal aortic aneurysm (AAA) screening. You may need this if you are a current or former smoker. STD (sexually transmitted disease) testing, if you are at risk. Follow these instructions at home: Eating and drinking  Eat a diet that includes fresh fruits and vegetables, whole grains, lean protein, and low-fat dairy products. Limit your intake of foods with high amounts of sugar, saturated fats, and salt. Take vitamin and mineral supplements as recommended by your health care provider. Do not drink alcohol if your health care provider tells you not to drink. If you drink alcohol: Limit how much you have to 0-2 drinks a day. Be aware of how much alcohol is in your drink. In the [place] one drink equals one 12 oz bottle of beer (355 mL), one 5 oz glass of wine (148 mL), or one 1 oz glass of hard liquor (44 mL).  Lifestyle Take daily care of your teeth and gums. Brush your teeth every morning and night with fluoride toothpaste. Floss one time each day. Stay active. Exercise for at least 30 minutes 5 or more days each week. Do not use any products that contain  nicotine or tobacco, such as cigarettes, e-cigarettes, and chewing tobacco. If you need help quitting, ask your health care provider. Do not use drugs. If you are sexually active, practice safe sex. Use a condom or other form of protection  to prevent STIs (sexually transmitted infections). Talk with your health care provider about taking a low-dose aspirin or statin. Find healthy ways to cope with stress, such as: Meditation, yoga, or listening to music. Journaling. Talking to a trusted person. Spending time with friends and family. Safety Always wear your seat belt while driving or riding in a vehicle. Do not drive: If you have been drinking alcohol. Do not ride with someone who has been drinking. When you are tired or distracted. While texting. Wear a helmet and other protective equipment during sports activities. If you have firearms in your house, make sure you follow all gun safety procedures. What's next? Visit your health care provider once a year for an annual wellness visit. Ask your health care provider how often you should have your eyes and teeth checked. Stay up to date on all vaccines. This information is not intended to replace advice given to you by your health care provider. Make sure you discuss any questions you have with your healthcare provider. Document Revised: 05/07/2019 Document Reviewed: 08/02/2018 Elsevier Patient Education  2022 [employer].

## 2021-03-03 NOTE — Progress Notes (Signed)
Ralph Stevens is a 70 y.o. male presents to office today for annual physical exam examination.    Concerns today include: 1. HLD with hypertension Compliant with Lipitor, lisinopril, Lopressor and Norvasc.  No chest pain, shortness of breath or abdominal pain.  2.  History of shingles Patient had shingles that involved the left side of his scalp and I back in March.  He continues to have some intermittent postherpetic neuralgia but overall has not needed medication for this.  He had an adverse side effect from gabapentin where he broke out in hives so was reluctant to use anything else.  He continues to follow-up with ophthalmology for checkups.    3.  Hemorrhoids He is undergoing internal hemorrhoid banding with 1 session left ago.  Overall this seems to be helping and he has not had any rectal bleeding in the last week or so.  Occupation: retired, Marital status: married, Substance use: none Diet: balanced, Exercise: tries to stay active Last eye exam: UTD, was being followed closely due to shingles in his eye (L) Last dental exam: UTD Last colonoscopy: UTD Refills needed today: none Immunizations needed: Plans for shingles at the 68-month mark or from when he had infection in March Immunization History  Administered Date(s) Administered   Praxair 10/30/2019, 11/27/2019, 08/18/2020    Past Medical History:  Diagnosis Date   Acid reflux    Arthritis    Arthritis    Cataract    Mixed form OU   Enlarged prostate    PSA goes up and down    GERD (gastroesophageal reflux disease)    High cholesterol    Hyperlipidemia    Hypertension    Hypertensive retinopathy    OU   Social History   Socioeconomic History   Marital status: Married    Spouse name: [name]   Number of children: 3   Years of education: Not on file   Highest education level: Not on file  Occupational History    Comment: semi-retired   Tobacco Use   Smoking status: Never    Smokeless tobacco: Never  Vaping Use   Vaping Use: Never used  Substance and Sexual Activity   Alcohol use: Never   Drug use: Never   Sexual activity: Not on file  Other Topics Concern   Not on file  Social History Narrative   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  He currently is unemployed but is actively looking for work as he enjoys being busy.   He recently relocated from [place] and resides locally with his wife.  He has a son that lives in [place] but his other 2 daughters are residing in [place].   Social Determinants of Health   Financial Resource Strain: Not on file  Food Insecurity: Not on file  Transportation Needs: Not on file  Physical Activity: Not on file  Stress: Not on file  Social Connections: Not on file  Intimate Partner Violence: Not on file   Past Surgical History:  Procedure Laterality Date   colonoscopy  2019   internal hemorrhoids and pancolonic diverticulosis. Surveillance due in 2024 due to history of adenomatous colon polyps and family history.   EYE SURGERY     TONSILLECTOMY     age 64    Family History  Problem Relation Age of Onset   Cancer Mother    Colon cancer Mother    Dementia Mother  Alzheimer's disease Mother    Emphysema Father    Hypothyroidism Sister    Thyroid disease Sister    Hypertension Sister    Multiple sclerosis Daughter    Heart defect Daughter    Colon cancer Maternal Grandmother    Heart disease Maternal Grandfather    Heart disease Paternal Grandmother    Cancer Paternal Grandfather        unknown ( mouth/throat)   Colon cancer Maternal Uncle     Current Outpatient Medications:    alfuzosin (UROXATRAL) 10 MG 24 hr tablet, Take 10 mg by mouth daily with breakfast., Disp: , Rfl:    amLODipine (NORVASC) 5 MG tablet, TAKE 1 TABLET BY MOUTH EVERY DAY, Disp: 90 tablet, Rfl: 3   aspirin 81 MG chewable tablet, Chew 81 mg by mouth in the morning. , Disp: , Rfl:     atorvastatin (LIPITOR) 20 MG tablet, Take 1 tablet (20 mg total) by mouth daily., Disp: 90 tablet, Rfl: 3   esomeprazole (NEXIUM) 20 MG capsule, Take 20 mg by mouth in the morning. , Disp: , Rfl:    fluticasone (FLONASE) 50 MCG/ACT nasal spray, Place 1-2 sprays into both nostrils daily as needed for allergies or rhinitis., Disp: 48 mL, Rfl: 3   lisinopril (ZESTRIL) 40 MG tablet, TAKE 1 TABLET BY MOUTH EVERY DAY, Disp: 90 tablet, Rfl: 0   metoprolol tartrate (LOPRESSOR) 50 MG tablet, Take 1 tablet (50 mg total) by mouth 2 (two) times daily., Disp: 180 tablet, Rfl: 3  Allergies  Allergen Reactions   Gabapentin Hives     ROS: Review of Systems Pertinent items noted in HPI and remainder of comprehensive ROS otherwise negative.    Physical exam BP (!) 142/87   Pulse 80   Temp 98.6 F (37 C)   Ht 6' (1.829 m)   Wt 191 lb (86.6 kg)   SpO2 96%   BMI 25.90 kg/m  General appearance: alert, cooperative, appears stated age, and no distress Head: Normocephalic, without obvious abnormality, atraumatic Eyes: negative findings: lids and lashes normal, conjunctivae and sclerae normal, corneas clear, and pupils equal, round, reactive to light and accomodation Ears: normal TM's and external ear canals both ears Nose: Nares normal. Septum midline. Mucosa normal. No drainage or sinus tenderness. Throat: lips, mucosa, and tongue normal; teeth and gums normal Neck: no adenopathy, no carotid bruit, supple, symmetrical, trachea midline, and thyroid not enlarged, symmetric, no tenderness/mass/nodules Back: symmetric, no curvature. ROM normal. No CVA tenderness. Lungs: clear to auscultation bilaterally Chest wall: no tenderness Heart: regular rate and rhythm, S1, S2 normal, no murmur, click, rub or gallop Abdomen: soft, non-tender; bowel sounds normal; no masses,  no organomegaly Extremities: extremities normal, atraumatic, no cyanosis or edema Pulses: 2+ and symmetric Skin:  Full body skin Exam deferred  to dermatology, whom he sees on Monday Lymph nodes: Cervical, supraclavicular, and axillary nodes normal. Neurologic: Alert and oriented X 3, normal strength and tone. Normal symmetric reflexes. Normal coordination and gait    Assessment/ Plan: [name] here for annual physical exam.   Annual physical exam  Mixed hyperlipidemia - Plan: Lipid Panel  Essential hypertension - Plan: amLODipine (NORVASC) 5 MG tablet  Post herpetic neuralgia  Internal hemorrhoids  Seems to be doing really well.  Labs were obtained today.  Blood pressure well controlled and Norvasc renewed.  Overall the postherpetic neuralgia seems to be improving so no further interventions may.  Could consider TCA if needed but I do not think he will  need further interventions  Internal hemorrhoids seem to be improving with banding and he has 1 session left.  Counseled on healthy lifestyle choices, including diet (rich in fruits, vegetables and lean meats and low in salt and simple carbohydrates) and exercise (at least 30 minutes of moderate physical activity daily).  Patient to follow up in 1 year for annual exam or sooner if needed.  Kellyjo Edgren M. Lajuana Ripple, DO

## 2021-03-08 DIAGNOSIS — L570 Actinic keratosis: Secondary | ICD-10-CM | POA: Diagnosis not present

## 2021-03-08 DIAGNOSIS — X32XXXD Exposure to sunlight, subsequent encounter: Secondary | ICD-10-CM | POA: Diagnosis not present

## 2021-03-18 ENCOUNTER — Encounter: Payer: Self-pay | Admitting: Gastroenterology

## 2021-03-18 ENCOUNTER — Ambulatory Visit: Payer: Medicare Other | Admitting: Gastroenterology

## 2021-03-18 ENCOUNTER — Other Ambulatory Visit: Payer: Self-pay

## 2021-03-18 VITALS — BP 148/86 | HR 78 | Temp 97.8°F | Ht 72.0 in | Wt 190.4 lb

## 2021-03-18 DIAGNOSIS — K648 Other hemorrhoids: Secondary | ICD-10-CM

## 2021-03-18 NOTE — Progress Notes (Signed)
[place] Banding Note:  Ralph Stevens is a very pleasant 70 year old male presenting with history of chronic symptomatic hemorrhoids. Last colonoscopy in 2019 in [place], with findings of internal hemorrhoids and pancolonic diverticulosis. Surveillance due in 2024 due to history of adenomatous colon polyps and family history. He has undergone right posterior and left lateral banding. Overall, he has had marked improvement in symptoms.    The patient presents with symptomatic grade 2 hemorrhoids, unresponsive to maximal medical therapy, requesting rubber band ligation of his hemorrhoidal disease. All risks, benefits, and alternative forms of therapy were described and informed consent was obtained.   The decision was made to band the right anterior internal hemorrhoid, and the [place] was used to perform band ligation without complication. Digital anorectal examination was then performed to assure proper positioning of the band, and to adjust the banded tissue as required. The patient was discharged home without pain or other issues. Dietary and behavioral recommendations were given and (if necessary prescriptions were given), along with follow-up instructions. The patient will return in as needed for followup and possible additional banding as required.  No complications were encountered and the patient tolerated the procedure well.   Annitta Needs, PhD, ANP-BC [facility]

## 2021-03-18 NOTE — Patient Instructions (Signed)
We will see you back as needed!  Please call or message if you need anything!  Next colonoscopy will be in 2024  I enjoyed seeing you again today! As you know, I value our relationship and want to provide genuine, compassionate, and quality care. I welcome your feedback. If you receive a survey regarding your visit,  I greatly appreciate you taking time to fill this out. See you next time!  Annitta Needs, PhD, ANP-BC [facility]

## 2021-04-27 ENCOUNTER — Encounter (INDEPENDENT_AMBULATORY_CARE_PROVIDER_SITE_OTHER): Payer: Medicare Other | Admitting: Ophthalmology

## 2021-05-12 NOTE — Telephone Encounter (Signed)
Erline Levine, can you open a slot for Oct 5th at 1030 for hemorrhoid banding? Thanks!

## 2021-05-20 NOTE — Progress Notes (Signed)
[facility] Note  05/21/2021     CHIEF COMPLAINT Patient presents for Retina Follow Up   HISTORY OF PRESENT ILLNESS: Ralph Stevens is a 70 y.o. male who presents to the clinic today for:  HPI     Retina Follow Up   Patient presents with  Other.  In both eyes.  This started months ago.  Severity is moderate.  Duration of months.  Since onset it is stable.  I, the attending physician,  performed the HPI with the patient and updated documentation appropriately.        Comments   Pt states vision is better OU since getting new glasses. Pt denies eye pain or discomfort and denies any new or worsening floaters or fol OU.      Last edited by Bernarda Caffey, MD on 05/21/2021  9:31 PM.    Pt states he has gotten new glasses since he was here last, no new herpes zoster outbreaks, he states he can tell he has nerve damage on the left side of his face, bc it is more sensitive to touch then the right side   Referring physician: [name], DO [place],  [place] [id number]  HISTORICAL INFORMATION:   Selected notes from the [facility] Referred by PCP (Dr. Lajuana Ripple) for history of retinal tear OS   CURRENT MEDICATIONS: No current outpatient medications on file. (Ophthalmic Drugs)   No current facility-administered medications for this visit. (Ophthalmic Drugs)   Current Outpatient Medications (Other)  Medication Sig   alfuzosin (UROXATRAL) 10 MG 24 hr tablet Take 10 mg by mouth daily with breakfast.   amLODipine (NORVASC) 5 MG tablet Take 1 tablet (5 mg total) by mouth daily.   aspirin 81 MG chewable tablet Chew 81 mg by mouth in the morning.    atorvastatin (LIPITOR) 20 MG tablet Take 1 tablet (20 mg total) by mouth daily.   esomeprazole (NEXIUM) 20 MG capsule Take 20 mg by mouth in the morning.    fluticasone (FLONASE) 50 MCG/ACT nasal spray Place 1-2 sprays into both nostrils daily as needed for allergies or rhinitis.    lisinopril (ZESTRIL) 40 MG tablet Take 1 tablet (40 mg total) by mouth daily.   metoprolol tartrate (LOPRESSOR) 50 MG tablet Take 1 tablet (50 mg total) by mouth 2 (two) times daily.   No current facility-administered medications for this visit. (Other)      REVIEW OF SYSTEMS: ROS   Positive for: Gastrointestinal, Skin, Musculoskeletal, Eyes Negative for: Constitutional, Neurological, Genitourinary, HENT, Endocrine, Cardiovascular, Respiratory, Psychiatric, Allergic/Imm, Heme/Lymph Last edited by Doneen Poisson on 05/21/2021 12:44 PM.        ALLERGIES Allergies  Allergen Reactions   Gabapentin Hives    PAST MEDICAL HISTORY Past Medical History:  Diagnosis Date   Acid reflux    Arthritis    Arthritis    Cataract    Mixed form OU   Enlarged prostate    PSA goes up and down    GERD (gastroesophageal reflux disease)    High cholesterol    Hyperlipidemia    Hypertension    Hypertensive retinopathy    OU   Past Surgical History:  Procedure Laterality Date   colonoscopy  2019   internal hemorrhoids and pancolonic diverticulosis. Surveillance due in 2024 due to history of adenomatous colon polyps and family history.   EYE SURGERY     TONSILLECTOMY     age 8  FAMILY HISTORY Family History  Problem Relation Age of Onset   Cancer Mother    Colon cancer Mother    Dementia Mother    Alzheimer's disease Mother    Emphysema Father    Hypothyroidism Sister    Thyroid disease Sister    Hypertension Sister    Multiple sclerosis Daughter    Heart defect Daughter    Colon cancer Maternal Grandmother    Heart disease Maternal Grandfather    Heart disease Paternal Grandmother    Cancer Paternal Grandfather        unknown ( mouth/throat)   Colon cancer Maternal Uncle     SOCIAL HISTORY Social History   Tobacco Use   Smoking status: Never   Smokeless tobacco: Never  Vaping Use   Vaping Use: Never used  Substance Use Topics   Alcohol use: Never   Drug  use: Never       OPHTHALMIC EXAM:  Base Eye Exam     Visual Acuity (Snellen - Linear)       Right Left   Dist cc 20/20 - 20/20 -    Correction: Glasses         Tonometry (Tonopen, 12:51 PM)       Right Left   Pressure 16 11         Pupils       Dark Light Shape React APD   Right 3 2 Round Brisk 0   Left 3 2 Round Brisk 0         Visual Fields       Left Right    Full Full         Extraocular Movement       Right Left    Full Full         Neuro/Psych     Oriented x3: Yes   Mood/Affect: Normal         Dilation     Both eyes: 1.0% Mydriacyl, 2.5% Phenylephrine @ 12:51 PM           Slit Lamp and Fundus Exam     External Exam       Right Left   External  vesicles on left forehead and scalp -- improving, negative Hutchinson's sign         Slit Lamp Exam       Right Left   Lids/Lashes Dermatochalasis - upper lid, mild Meibomian gland dysfunction Dermatochalasis - upper lid, Mild Meibomian gland dysfunction, Telangiectasia   Conjunctiva/Sclera White and quiet White and quiet   Cornea Trace Punctate epithelial erosions, mild Arcus tear film debris; faint, sub epi scars / corneal haze superior paracentral   Anterior Chamber Deep,clear, narrow temporal angle Deep and clear, no cell or pigment   Iris Round and reactive Round and reactive   Lens 2-3+ Nuclear sclerosis, 2-3+ Cortical cataract 2-3+ Nuclear sclerosis, 2-3+ Cortical cataract   Vitreous Vitreous syneresis Vitreous syneresis, clear, no vitritis          Fundus Exam       Right Left   Disc Pink and Sharp Pink and Sharp   C/D Ratio 0.4 0.3   Macula Flat, Good foveal reflex, Retinal pigment epithelial mottling, No heme or edema Flat, good foveal reflex, ERM with early striae, No heme    Vessels Mild Vascular attenuation, mild Tortuousity Mild Vascular attenuation, mild Tortuousity   Periphery Attached, No heme  Attached, operculated tear at 1200 with good laser surrounding,  No heme; no new  RT/RD           Refraction     Wearing Rx       Sphere Cylinder Axis Add   Right -2.75 +3.75 177 +2.50   Left -1.25 +2.75 173 +2.50            IMAGING AND PROCEDURES  Imaging and Procedures for @TODAY @  OCT, Retina - OU - Both Eyes       Right Eye Quality was good. Central Foveal Thickness: 295. Progression has been stable. Findings include normal foveal contour, no IRF, no SRF.   Left Eye Quality was good. Central Foveal Thickness: 374. Progression has improved. Findings include epiretinal membrane, no IRF, no SRF, macular pucker, normal foveal contour (Mild interval improvement in ERM thickness and foveal contour).   Notes *Images captured and stored on drive  Diagnosis / Impression:  OD: NFP, no IRF/SRF OS: +ERM w/ pucker -- Mild interval improvement in ERM thickness and foveal contour  Clinical management:  See below  Abbreviations: NFP - Normal foveal profile. CME - cystoid macular edema. PED - pigment epithelial detachment. IRF - intraretinal fluid. SRF - subretinal fluid. EZ - ellipsoid zone. ERM - epiretinal membrane. ORA - outer retinal atrophy. ORT - outer retinal tubulation. SRHM - subretinal hyper-reflective material            ASSESSMENT/PLAN:    ICD-10-CM   1. Epiretinal membrane (ERM) of left eye  H35.372     2. Retinal edema  H35.81 OCT, Retina - OU - Both Eyes    3. History of repair of retinal tear by laser photocoagulation  Z98.890     4. Essential hypertension  I10     5. Hypertensive retinopathy of both eyes  H35.033     6. Combined forms of age-related cataract of both eyes  H25.813     7. Herpes zoster keratoconjunctivitis  B02.33      1,2. Epiretinal membrane, OS  - likely related to history of RT s/p laser retinopexy (see below)  - mild ERM w/ early pucker -- slightly improved from prior on OCT  - asymptomatic, no metamorphopsia  - no indication for surgery at this time  - monitor for now  - f/u 6 mos,  DFE, OCT  3. History of retinal tear OS  - operculated tear located at 1200  - s/p laser retinopexy (Dr. Shana Chute, [facility], [place], [place]) -- good laser surrounding  - stable   - no other RT/RD  4,5. Hypertensive retinopathy OU  - discussed importance of tight BP control  - monitor   6. Mixed form age related cataracts OU  - The symptoms of cataract, surgical options, and treatments and risks were discussed with patient.  - discussed diagnosis and progression  - not yet visually significant  - monitor for now  7. Herpes zoster with ocular involvement OS -- resolved  - pt diagnosed w/ herpes zoster on 3.21.22 and completed 1g po valtrex TID x10 days  - exam shows stable improvement in periorbital vesicles in V1 dermatome (no eyelid vesicles, negative Hutchinson's sign) and periorbital edema resolved  - cornea exam shows stable improvement in mild inf KP OS -- resolved  Ophthalmic Meds Ordered this visit:  No orders of the defined types were placed in this encounter.    Return in about 6 months (around 11/18/2021) for f/u ERM OS, DFE, OCT.  There are no Patient Instructions on file for this visit.  This document serves as a record  of services personally performed by Gardiner Sleeper, MD, PhD. It was created on their behalf by Leonie Douglas, an ophthalmic technician. The creation of this record is the provider's dictation and/or activities during the visit.    Electronically signed by: Leonie Douglas COA, 05/21/21  9:33 PM  This document serves as a record of services personally performed by Gardiner Sleeper, MD, PhD. It was created on their behalf by San Jetty. Owens Shark, OA an ophthalmic technician. The creation of this record is the provider's dictation and/or activities during the visit.    Electronically signed by: San Jetty. Marguerita Merles 09.30.2022 9:33 PM  Gardiner Sleeper, M.D., Ph.D. Diseases & Surgery of the Retina and [place] 05/21/2021  I have reviewed the above documentation for accuracy and completeness, and I agree with the above. Gardiner Sleeper, M.D., Ph.D. 05/21/21 9:35 PM   Abbreviations: M myopia (nearsighted); A astigmatism; H hyperopia (farsighted); P presbyopia; Mrx spectacle prescription;  CTL contact lenses; OD right eye; OS left eye; OU both eyes  XT exotropia; ET esotropia; PEK punctate epithelial keratitis; PEE punctate epithelial erosions; DES dry eye syndrome; MGD meibomian gland dysfunction; ATs artificial tears; PFAT's preservative free artificial tears; [place] nuclear sclerotic cataract; PSC posterior subcapsular cataract; ERM epi-retinal membrane; PVD posterior vitreous detachment; RD retinal detachment; DM diabetes mellitus; DR diabetic retinopathy; NPDR non-proliferative diabetic retinopathy; PDR proliferative diabetic retinopathy; CSME clinically significant macular edema; DME diabetic macular edema; dbh dot blot hemorrhages; CWS cotton wool spot; POAG primary open angle glaucoma; C/D cup-to-disc ratio; HVF humphrey visual field; GVF goldmann visual field; OCT optical coherence tomography; IOP intraocular pressure; BRVO Branch retinal vein occlusion; CRVO central retinal vein occlusion; CRAO central retinal artery occlusion; BRAO branch retinal artery occlusion; RT retinal tear; SB scleral buckle; PPV pars plana vitrectomy; VH Vitreous hemorrhage; PRP panretinal laser photocoagulation; IVK intravitreal kenalog; VMT vitreomacular traction; MH Macular hole;  NVD neovascularization of the disc; NVE neovascularization elsewhere; AREDS age related eye disease study; ARMD age related macular degeneration; POAG primary open angle glaucoma; EBMD epithelial/anterior basement membrane dystrophy; ACIOL anterior chamber intraocular lens; IOL intraocular lens; PCIOL posterior chamber intraocular lens; Phaco/IOL phacoemulsification with intraocular lens placement; [place] photorefractive keratectomy; LASIK laser  assisted in situ keratomileusis; HTN hypertension; DM diabetes mellitus; COPD chronic obstructive pulmonary disease

## 2021-05-21 ENCOUNTER — Encounter (INDEPENDENT_AMBULATORY_CARE_PROVIDER_SITE_OTHER): Payer: Self-pay | Admitting: Ophthalmology

## 2021-05-21 ENCOUNTER — Other Ambulatory Visit: Payer: Self-pay

## 2021-05-21 ENCOUNTER — Ambulatory Visit (INDEPENDENT_AMBULATORY_CARE_PROVIDER_SITE_OTHER): Payer: Medicare Other | Admitting: Ophthalmology

## 2021-05-21 DIAGNOSIS — Z9889 Other specified postprocedural states: Secondary | ICD-10-CM

## 2021-05-21 DIAGNOSIS — I10 Essential (primary) hypertension: Secondary | ICD-10-CM | POA: Diagnosis not present

## 2021-05-21 DIAGNOSIS — B0233 Zoster keratitis: Secondary | ICD-10-CM | POA: Diagnosis not present

## 2021-05-21 DIAGNOSIS — H25813 Combined forms of age-related cataract, bilateral: Secondary | ICD-10-CM | POA: Diagnosis not present

## 2021-05-21 DIAGNOSIS — H3581 Retinal edema: Secondary | ICD-10-CM

## 2021-05-21 DIAGNOSIS — H35372 Puckering of macula, left eye: Secondary | ICD-10-CM | POA: Diagnosis not present

## 2021-05-21 DIAGNOSIS — H35033 Hypertensive retinopathy, bilateral: Secondary | ICD-10-CM

## 2021-05-26 ENCOUNTER — Other Ambulatory Visit: Payer: Self-pay

## 2021-05-26 ENCOUNTER — Encounter: Payer: Self-pay | Admitting: Gastroenterology

## 2021-05-26 ENCOUNTER — Ambulatory Visit: Payer: Medicare Other | Admitting: Gastroenterology

## 2021-05-26 VITALS — BP 148/92 | HR 78 | Temp 97.5°F | Ht 72.0 in | Wt 195.4 lb

## 2021-05-26 DIAGNOSIS — K625 Hemorrhage of anus and rectum: Secondary | ICD-10-CM

## 2021-05-26 DIAGNOSIS — Z800 Family history of malignant neoplasm of digestive organs: Secondary | ICD-10-CM | POA: Diagnosis not present

## 2021-05-26 DIAGNOSIS — K642 Third degree hemorrhoids: Secondary | ICD-10-CM

## 2021-05-26 NOTE — H&P (View-Only) (Signed)
Referring Provider: [name], DO Primary Care Physician:  [name], DO Primary GI: Dr. Gala Romney   Chief Complaint  Patient presents with   Hemorrhoids    Banding, last helped. Started bleeding again 3 weeks ago. Sister recently diagnosed with stage 3 colon cancer    HPI:   Ralph Stevens is a 70 y.o. male presenting todayfor consideration of hemorrhoid banding. Last colonoscopy in 2019 in [place], with findings of internal hemorrhoids and pancolonic diverticulosis. Surveillance due in 2024 due to history of adenomatous colon polyps and family history. He has undergone right posterior, left lateral banding, and right anterior. He tells me today that his sister was recently diagnosed with Stage 3 colon cancer. His mother also had a history of colon cancer. He notes several aunts/uncles with colon cancer.   He had recurrent bleeding recently. Had to manually hold pressure. Able to reduce internal hemorrhoids. No abdominal pain. Rare constipation. No GERD. No unexplained weight loss or lack of appetite. He is interested in early interval colonoscopy after discussion today.    Past Medical History:  Diagnosis Date   Acid reflux    Arthritis    Arthritis    Cataract    Mixed form OU   Enlarged prostate    PSA goes up and down    GERD (gastroesophageal reflux disease)    High cholesterol    Hyperlipidemia    Hypertension    Hypertensive retinopathy    OU    Past Surgical History:  Procedure Laterality Date   colonoscopy  2019   internal hemorrhoids and pancolonic diverticulosis. Surveillance due in 2024 due to history of adenomatous colon polyps and family history.   EYE SURGERY     TONSILLECTOMY     age 21     Current Outpatient Medications  Medication Sig Dispense Refill   alfuzosin (UROXATRAL) 10 MG 24 hr tablet Take 10 mg by mouth daily with breakfast.     amLODipine (NORVASC) 5 MG tablet Take 1 tablet (5 mg total) by mouth daily.  90 tablet 3   aspirin 81 MG chewable tablet Chew 81 mg by mouth in the morning.      atorvastatin (LIPITOR) 20 MG tablet Take 1 tablet (20 mg total) by mouth daily. 90 tablet 3   esomeprazole (NEXIUM) 20 MG capsule Take 20 mg by mouth in the morning.      fluticasone (FLONASE) 50 MCG/ACT nasal spray Place 1-2 sprays into both nostrils daily as needed for allergies or rhinitis. 48 mL 3   lisinopril (ZESTRIL) 40 MG tablet Take 1 tablet (40 mg total) by mouth daily. 90 tablet 3   metoprolol tartrate (LOPRESSOR) 50 MG tablet Take 1 tablet (50 mg total) by mouth 2 (two) times daily. 180 tablet 3   No current facility-administered medications for this visit.    Allergies as of 05/26/2021 - Review Complete 05/26/2021  Allergen Reaction Noted   Gabapentin Hives 12/07/2020    Family History  Problem Relation Age of Onset   Cancer Mother    Colon cancer Mother    Dementia Mother    Alzheimer's disease Mother    Emphysema Father    Hypothyroidism Sister    Thyroid disease Sister    Hypertension Sister    Multiple sclerosis Daughter    Heart defect Daughter    Colon cancer Maternal Grandmother    Heart disease Maternal Grandfather    Heart disease Paternal Grandmother  Cancer Paternal Grandfather        unknown ( mouth/throat)   Colon cancer Maternal Uncle     Social History   Socioeconomic History   Marital status: Married    Spouse name: [name]   Number of children: 3   Years of education: Not on file   Highest education level: Not on file  Occupational History    Comment: semi-retired   Tobacco Use   Smoking status: Never   Smokeless tobacco: Never  Vaping Use   Vaping Use: Never used  Substance and Sexual Activity   Alcohol use: Never   Drug use: Never   Sexual activity: Not on file  Other Topics Concern   Not on file  Social History Narrative   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  He currently is unemployed but is  actively looking for work as he enjoys being busy.   He recently relocated from [place] and resides locally with his wife.  He has a son that lives in [place] but his other 2 daughters are residing in [place].   Social Determinants of Health   Financial Resource Strain: Not on file  Food Insecurity: Not on file  Transportation Needs: Not on file  Physical Activity: Not on file  Stress: Not on file  Social Connections: Not on file    Review of Systems: Gen: Denies fever, chills, anorexia. Denies fatigue, weakness, weight loss.  CV: Denies chest pain, palpitations, syncope, peripheral edema, and claudication. Resp: Denies dyspnea at rest, cough, wheezing, coughing up blood, and pleurisy. GI: see HPI Derm: Denies rash, itching, dry skin Psych: Denies depression, anxiety, memory loss, confusion. No homicidal or suicidal ideation.  Heme: see HPI  Physical Exam: BP (!) 148/92   Pulse 78   Temp (!) 97.5 F (36.4 C) (Temporal)   Ht 6' (1.829 m)   Wt 195 lb 6.4 oz (88.6 kg)   BMI 26.50 kg/m  General:   Alert and oriented. No distress noted. Pleasant and cooperative.  Head:  Normocephalic and atraumatic. Eyes:  Conjuctiva clear without scleral icterus. Mouth:  mask in place Cardiac: S1 S2 present, no murmurs Lungs: clear bilaterally Abdomen:  +BS, soft, non-tender and non-distended. No rebound or guarding. No HSM or masses noted. Rectal: see below Msk:  Symmetrical without gross deformities. Normal posture. Extremities:  Without edema. Neurologic:  Alert and  oriented x4 Psych:  Alert and cooperative. Normal mood and affect.  [facility] Banding Note:   The patient presents with symptomatic grade 3 hemorrhoids, unresponsive to maximal medical therapy, requesting rubber band ligation of his hemorrhoidal disease. All risks, benefits, and alternative forms of therapy were described and informed consent was obtained.  In the left lateral decubitus position, rectal exam was performed.  External non-thrombosed hemorrhoids noted. Medium-sized prolapsing Grade 3 internal hemorrhoids prolapsing, appearing to be from left lateral column. Anoscopy was then performed and no obvious mass appreciated proximally. Left lateral column amenable for banding.  The decision was made to band the left lateral internal hemorrhoid, and the [place] was used to perform band ligation without complication. Digital anorectal examination was then performed to assure proper positioning of the band, and to adjust the banded tissue as required. The patient was discharged home without pain or other issues. No complications were encountered and the patient tolerated the procedure well.   ASSESSMENT: Ralph Stevens is a 70 y.o. male presenting today with history of symptomatic  hemorrhoids, s/p banding earlier this year and additional banding of left lateral column today. He has noted recurrent bleeding since last banding, and he now tells me his sister was recently diagnosed with stage 3 colon cancer. He has a significant family history of colon cancer in his mother as well, along with personal history of polyps in remote past.  Last colonoscopy was in 2019 at outside facility without polyps [facility]). Due to now 2 first-degree relatives with colon cancer and patient's recurrent bleeding, I discussed diagnostic early interval colonoscopy. He is appreciative of this and had wanted to pursue this regardless. I highly suspect benign anorectal source, but he is at high risk due to family history.   Regarding internal hemorrhoids, he may need an additional neutral banding after colonoscopy, which remains to be seen.    PLAN:  Proceed with colonoscopy by Dr. Gala Romney in near future using Propofol: the risks, benefits, and alternatives have been discussed with the patient in detail. The patient states understanding and desires to proceed.  Follow-up thereafter   Annitta Needs, PhD, ANP-BC [facility]  Gastroenterology

## 2021-05-26 NOTE — Progress Notes (Signed)
Referring Provider: [name], DO Primary Care Physician:  [name], DO Primary GI: Dr. Gala Romney   Chief Complaint  Patient presents with   Hemorrhoids    Banding, last helped. Started bleeding again 3 weeks ago. Sister recently diagnosed with stage 3 colon cancer    HPI:   Clifton Safley is a 70 y.o. male presenting todayfor consideration of hemorrhoid banding. Last colonoscopy in 2019 in [place], with findings of internal hemorrhoids and pancolonic diverticulosis. Surveillance due in 2024 due to history of adenomatous colon polyps and family history. He has undergone right posterior, left lateral banding, and right anterior. He tells me today that his sister was recently diagnosed with Stage 3 colon cancer. His mother also had a history of colon cancer. He notes several aunts/uncles with colon cancer.   He had recurrent bleeding recently. Had to manually hold pressure. Able to reduce internal hemorrhoids. No abdominal pain. Rare constipation. No GERD. No unexplained weight loss or lack of appetite. He is interested in early interval colonoscopy after discussion today.    Past Medical History:  Diagnosis Date   Acid reflux    Arthritis    Arthritis    Cataract    Mixed form OU   Enlarged prostate    PSA goes up and down    GERD (gastroesophageal reflux disease)    High cholesterol    Hyperlipidemia    Hypertension    Hypertensive retinopathy    OU    Past Surgical History:  Procedure Laterality Date   colonoscopy  2019   internal hemorrhoids and pancolonic diverticulosis. Surveillance due in 2024 due to history of adenomatous colon polyps and family history.   EYE SURGERY     TONSILLECTOMY     age 73     Current Outpatient Medications  Medication Sig Dispense Refill   alfuzosin (UROXATRAL) 10 MG 24 hr tablet Take 10 mg by mouth daily with breakfast.     amLODipine (NORVASC) 5 MG tablet Take 1 tablet (5 mg total) by mouth daily.  90 tablet 3   aspirin 81 MG chewable tablet Chew 81 mg by mouth in the morning.      atorvastatin (LIPITOR) 20 MG tablet Take 1 tablet (20 mg total) by mouth daily. 90 tablet 3   esomeprazole (NEXIUM) 20 MG capsule Take 20 mg by mouth in the morning.      fluticasone (FLONASE) 50 MCG/ACT nasal spray Place 1-2 sprays into both nostrils daily as needed for allergies or rhinitis. 48 mL 3   lisinopril (ZESTRIL) 40 MG tablet Take 1 tablet (40 mg total) by mouth daily. 90 tablet 3   metoprolol tartrate (LOPRESSOR) 50 MG tablet Take 1 tablet (50 mg total) by mouth 2 (two) times daily. 180 tablet 3   No current facility-administered medications for this visit.    Allergies as of 05/26/2021 - Review Complete 05/26/2021  Allergen Reaction Noted   Gabapentin Hives 12/07/2020    Family History  Problem Relation Age of Onset   Cancer Mother    Colon cancer Mother    Dementia Mother    Alzheimer's disease Mother    Emphysema Father    Hypothyroidism Sister    Thyroid disease Sister    Hypertension Sister    Multiple sclerosis Daughter    Heart defect Daughter    Colon cancer Maternal Grandmother    Heart disease Maternal Grandfather    Heart disease Paternal Grandmother  Cancer Paternal Grandfather        unknown ( mouth/throat)   Colon cancer Maternal Uncle     Social History   Socioeconomic History   Marital status: Married    Spouse name: [name]   Number of children: 3   Years of education: Not on file   Highest education level: Not on file  Occupational History    Comment: semi-retired   Tobacco Use   Smoking status: Never   Smokeless tobacco: Never  Vaping Use   Vaping Use: Never used  Substance and Sexual Activity   Alcohol use: Never   Drug use: Never   Sexual activity: Not on file  Other Topics Concern   Not on file  Social History Narrative   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  He currently is unemployed but is  actively looking for work as he enjoys being busy.   He recently relocated from [place] and resides locally with his wife.  He has a son that lives in [place] but his other 2 daughters are residing in [place].   Social Determinants of Health   Financial Resource Strain: Not on file  Food Insecurity: Not on file  Transportation Needs: Not on file  Physical Activity: Not on file  Stress: Not on file  Social Connections: Not on file    Review of Systems: Gen: Denies fever, chills, anorexia. Denies fatigue, weakness, weight loss.  CV: Denies chest pain, palpitations, syncope, peripheral edema, and claudication. Resp: Denies dyspnea at rest, cough, wheezing, coughing up blood, and pleurisy. GI: see HPI Derm: Denies rash, itching, dry skin Psych: Denies depression, anxiety, memory loss, confusion. No homicidal or suicidal ideation.  Heme: see HPI  Physical Exam: BP (!) 148/92   Pulse 78   Temp (!) 97.5 F (36.4 C) (Temporal)   Ht 6' (1.829 m)   Wt 195 lb 6.4 oz (88.6 kg)   BMI 26.50 kg/m  General:   Alert and oriented. No distress noted. Pleasant and cooperative.  Head:  Normocephalic and atraumatic. Eyes:  Conjuctiva clear without scleral icterus. Mouth:  mask in place Cardiac: S1 S2 present, no murmurs Lungs: clear bilaterally Abdomen:  +BS, soft, non-tender and non-distended. No rebound or guarding. No HSM or masses noted. Rectal: see below Msk:  Symmetrical without gross deformities. Normal posture. Extremities:  Without edema. Neurologic:  Alert and  oriented x4 Psych:  Alert and cooperative. Normal mood and affect.  [facility] Banding Note:   The patient presents with symptomatic grade 3 hemorrhoids, unresponsive to maximal medical therapy, requesting rubber band ligation of his hemorrhoidal disease. All risks, benefits, and alternative forms of therapy were described and informed consent was obtained.  In the left lateral decubitus position, rectal exam was performed.  External non-thrombosed hemorrhoids noted. Medium-sized prolapsing Grade 3 internal hemorrhoids prolapsing, appearing to be from left lateral column. Anoscopy was then performed and no obvious mass appreciated proximally. Left lateral column amenable for banding.  The decision was made to band the left lateral internal hemorrhoid, and the [place] was used to perform band ligation without complication. Digital anorectal examination was then performed to assure proper positioning of the band, and to adjust the banded tissue as required. The patient was discharged home without pain or other issues. No complications were encountered and the patient tolerated the procedure well.   ASSESSMENT: Piero Mustard is a 70 y.o. male presenting today with history of symptomatic  hemorrhoids, s/p banding earlier this year and additional banding of left lateral column today. He has noted recurrent bleeding since last banding, and he now tells me his sister was recently diagnosed with stage 3 colon cancer. He has a significant family history of colon cancer in his mother as well, along with personal history of polyps in remote past.  Last colonoscopy was in 2019 at outside facility without polyps [facility]). Due to now 2 first-degree relatives with colon cancer and patient's recurrent bleeding, I discussed diagnostic early interval colonoscopy. He is appreciative of this and had wanted to pursue this regardless. I highly suspect benign anorectal source, but he is at high risk due to family history.   Regarding internal hemorrhoids, he may need an additional neutral banding after colonoscopy, which remains to be seen.    PLAN:  Proceed with colonoscopy by Dr. Gala Romney in near future using Propofol: the risks, benefits, and alternatives have been discussed with the patient in detail. The patient states understanding and desires to proceed.  Follow-up thereafter   Annitta Needs, PhD, ANP-BC [facility]  Gastroenterology

## 2021-05-26 NOTE — Patient Instructions (Signed)
We are arranging a colonoscopy with Dr. Gala Romney in the near future!  Due to how enlarged your hemorrhoids were, you might benefit from banding again, but we shall see.  Further recommendations to follow!  I enjoyed seeing you again today! As you know, I value our relationship and want to provide genuine, compassionate, and quality care. I welcome your feedback. If you receive a survey regarding your visit,  I greatly appreciate you taking time to fill this out. See you next time!  Annitta Needs, PhD, ANP-BC [facility] Gastroenterology

## 2021-06-02 ENCOUNTER — Telehealth: Payer: Self-pay | Admitting: *Deleted

## 2021-06-02 MED ORDER — PEG 3350-KCL-NA BICARB-NACL 420 G PO SOLR: ORAL | 0 refills | Status: DC

## 2021-06-02 NOTE — Telephone Encounter (Signed)
CALLED PT. He has been scheduled for TCS with Dr. Gala Romney 11/2 at 10:30am. Aware will mail prep instructions and send Rx to pharmacy. Also will send via [facility].    PA approved via [facility]. Auth# [id number], DOS Jun 23, 2021 - Aug 21, 2021

## 2021-06-23 ENCOUNTER — Encounter (HOSPITAL_COMMUNITY): Payer: Self-pay | Admitting: Internal Medicine

## 2021-06-23 ENCOUNTER — Ambulatory Visit (HOSPITAL_COMMUNITY)
Admission: RE | Admit: 2021-06-23 | Discharge: 2021-06-23 | Disposition: A | Discharge status: Home / Self Care | Payer: Medicare Other | Attending: Internal Medicine | Admitting: Internal Medicine

## 2021-06-23 ENCOUNTER — Encounter (HOSPITAL_COMMUNITY)
Admission: RE | Disposition: A | Discharge status: Home / Self Care | Payer: Self-pay | Source: Home / Self Care | Attending: Internal Medicine

## 2021-06-23 ENCOUNTER — Other Ambulatory Visit: Payer: Self-pay

## 2021-06-23 DIAGNOSIS — K5730 Diverticulosis of large intestine without perforation or abscess without bleeding: Secondary | ICD-10-CM | POA: Diagnosis not present

## 2021-06-23 DIAGNOSIS — K921 Melena: Secondary | ICD-10-CM | POA: Diagnosis not present

## 2021-06-23 DIAGNOSIS — I10 Essential (primary) hypertension: Secondary | ICD-10-CM | POA: Diagnosis not present

## 2021-06-23 DIAGNOSIS — K648 Other hemorrhoids: Secondary | ICD-10-CM | POA: Diagnosis not present

## 2021-06-23 DIAGNOSIS — Z7982 Long term (current) use of aspirin: Secondary | ICD-10-CM | POA: Insufficient documentation

## 2021-06-23 DIAGNOSIS — Z800 Family history of malignant neoplasm of digestive organs: Secondary | ICD-10-CM | POA: Diagnosis not present

## 2021-06-23 DIAGNOSIS — Z79899 Other long term (current) drug therapy: Secondary | ICD-10-CM | POA: Diagnosis not present

## 2021-06-23 DIAGNOSIS — Z86010 Personal history of colonic polyps: Secondary | ICD-10-CM | POA: Insufficient documentation

## 2021-06-23 DIAGNOSIS — K219 Gastro-esophageal reflux disease without esophagitis: Secondary | ICD-10-CM | POA: Diagnosis not present

## 2021-06-23 DIAGNOSIS — E7800 Pure hypercholesterolemia, unspecified: Secondary | ICD-10-CM | POA: Diagnosis not present

## 2021-06-23 HISTORY — PX: COLONOSCOPY: SHX5424

## 2021-06-23 SURGERY — COLONOSCOPY
Anesthesia: Moderate Sedation

## 2021-06-23 MED ORDER — MIDAZOLAM HCL 5 MG/5ML IJ SOLN
INTRAMUSCULAR | Status: AC
  Filled 2021-06-23: qty 10

## 2021-06-23 MED ORDER — MEPERIDINE HCL 100 MG/ML IJ SOLN
INTRAMUSCULAR | Status: DC | PRN
  Administered 2021-06-23: 10 mg via INTRAVENOUS
  Administered 2021-06-23: 40 mg via INTRAVENOUS

## 2021-06-23 MED ORDER — SODIUM CHLORIDE 0.9 % IV SOLN: INTRAVENOUS | Status: DC

## 2021-06-23 MED ORDER — MEPERIDINE HCL 50 MG/ML IJ SOLN
INTRAMUSCULAR | Status: AC
  Filled 2021-06-23: qty 1

## 2021-06-23 MED ORDER — ONDANSETRON HCL 4 MG/2ML IJ SOLN
INTRAMUSCULAR | Status: DC | PRN
  Administered 2021-06-23: 4 mg via INTRAVENOUS

## 2021-06-23 MED ORDER — ONDANSETRON HCL 4 MG/2ML IJ SOLN
INTRAMUSCULAR | Status: AC
  Filled 2021-06-23: qty 2

## 2021-06-23 MED ORDER — MIDAZOLAM HCL 5 MG/5ML IJ SOLN
INTRAMUSCULAR | Status: DC | PRN
  Administered 2021-06-23 (×2): 1 mg via INTRAVENOUS
  Administered 2021-06-23: 2 mg via INTRAVENOUS
  Administered 2021-06-23 (×2): 1 mg via INTRAVENOUS

## 2021-06-23 NOTE — Interval H&P Note (Signed)
History and Physical Interval Note:  06/23/2021 9:32 AM  Ralph Stevens  has presented today for surgery, with the diagnosis of rectal bleeding, family hx colon cancer.  The various methods of treatment have been discussed with the patient and family. After consideration of risks, benefits and other options for treatment, the patient has consented to  Procedure(s) with comments: COLONOSCOPY (N/A) - 10:30am as a surgical intervention.  The patient's history has been reviewed, patient examined, no change in status, stable for surgery.  I have reviewed the patient's chart and labs.  Questions were answered to the patient's satisfaction.     Emer Onnen  No change.  Patient states since last banding session rectal bleeding has resolved.  However in view of his sisters recent diagnosis of advanced colon cancer and he has personal history of colon polyps and his recent symptoms of bleeding diagnostic colonoscopy now being done per plan.  The risks, benefits, limitations, alternatives and imponderables have been reviewed with the patient. Questions have been answered. All parties are agreeable.

## 2021-06-23 NOTE — Op Note (Signed)
[facility] Patient Name: Ralph Stevens Procedure Date: 06/23/2021 9:34 AM MRN: [id number] Date of Birth: [birth year] Attending MD: Norvel Richards , MD CSN: [id number] Age: 70 Admit Type: Outpatient Procedure:                Colonoscopy Indications:              Hematochezia - resolved after last banding session.                            Personal history of colonic polyps. Positive family                            history of advanced colon cancer in a first-degree                            relative. Providers:                Norvel Richards, MD, Crystal Page, Raphael Gibney, Technician Referring MD:              Medicines:                Midazolam 6 mg IV, Meperidine 50 mg IV Complications:            No immediate complications. Estimated Blood Loss:     Estimated blood loss: none. Procedure:                Pre-Anesthesia Assessment:                           - Prior to the procedure, a History and Physical                            was performed, and patient medications and                            allergies were reviewed. The patient's tolerance of                            previous anesthesia was also reviewed. The risks                            and benefits of the procedure and the sedation                            options and risks were discussed with the patient.                            All questions were answered, and informed consent                            was obtained. ASA Grade Assessment: II - A patient  with mild systemic disease. After reviewing the                            risks and benefits, the patient was deemed in                            satisfactory condition to undergo the procedure.                           After obtaining informed consent, the colonoscope                            was passed under direct vision. Throughout the                            procedure, the  patient's blood pressure, pulse, and                            oxygen saturations were monitored continuously. The                            [id number]) scope was introduced through the                            anus and advanced to the the cecum, identified by                            appendiceal orifice and ileocecal valve. The                            colonoscopy was performed without difficulty. The                            patient tolerated the procedure well. The quality                            of the bowel preparation was adequate. Scope In: 9:49:29 AM Scope Out: 10:02:19 AM Scope Withdrawal Time: 0 hours 8 minutes 17 seconds  Total Procedure Duration: 0 hours 12 minutes 50 seconds  Findings:      Hemorrhoids were found on perianal exam. 2 small grade 4 hemorrhoid       tags. Minimal internal hemorrhoids. Scars appropriately located       consistent with prior hemorrhoid banding.      Scattered medium-mouthed diverticula were found in the entire colon.      The exam was otherwise without abnormality on direct and retroflexion       views. Impression:               - Hemorrhoids found on perianal exam. Small grade                            4. Rectal scar indicative of prior banding sites                           -  Diverticulosis in the entire examined colon.                           - The examination was otherwise normal on direct                            and retroflexion views.                           - No specimens collected. glad to hear that rectal                            bleeding has resolved. He may or may not benefit                            from further banding in the near future. He has 2                            small grade 4 tags which probably will persistent                            even with more banding- But may be asymptomatic. Moderate Sedation:      Moderate (conscious) sedation was administered by the endoscopy nurse       and  supervised by the endoscopist. The following parameters were       monitored: oxygen saturation, heart rate, blood pressure, respiratory       rate, EKG, adequacy of pulmonary ventilation, and response to care.       Total physician intraservice time was 17 minutes. Recommendation:           - Patient has a contact number available for                            emergencies. The signs and symptoms of potential                            delayed complications were discussed with the                            patient. Return to normal activities tomorrow.                            Written discharge instructions were provided to the                            patient.                           - Resume previous diet.                           - Continue present medications. Continue daily                            fiber. Continue not to strain with BM's                           -  Repeat colonoscopy in 5 years for surveillance.                           - Return to GI office in 2 months. Procedure Code(s):        --- Professional ---                           [phone], Colonoscopy, flexible; diagnostic, including                            collection of specimen(s) by brushing or washing,                            when performed (separate procedure)                           G0500, Moderate sedation services provided by the                            same physician or other qualified health care                            professional performing a gastrointestinal                            endoscopic service that sedation supports,                            requiring the presence of an independent trained                            observer to assist in the monitoring of the                            patient's level of consciousness and physiological                            status; initial 15 minutes of intra-service time;                            patient age 81 years or older  (additional time may                            be reported with [phone], as appropriate) Diagnosis Code(s):        --- Professional ---                           K64.9, Unspecified hemorrhoids                           K92.1, Melena (includes Hematochezia)                           K57.30, Diverticulosis of large intestine without  perforation or abscess without bleeding CPT copyright 2019 American Medical Association. All rights reserved. The codes documented in this report are preliminary and upon coder review may  be revised to meet current compliance requirements. Cristopher Estimable. Kendall Arnell, MD Norvel Richards, MD 06/23/2021 10:19:46 AM This report has been signed electronically. Number of Addenda: 0

## 2021-06-23 NOTE — Discharge Instructions (Addendum)
Colonoscopy Discharge Instructions  Read the instructions outlined below and refer to this sheet in the next few weeks. These discharge instructions provide you with general information on caring for yourself after you leave the hospital. Your doctor may also give you specific instructions. While your treatment has been planned according to the most current medical practices available, unavoidable complications occasionally occur. If you have any problems or questions after discharge, call Dr. Gala Romney at [phone]. ACTIVITY You may resume your regular activity, but move at a slower pace for the next 24 hours.  Take frequent rest periods for the next 24 hours.  Walking will help get rid of the air and reduce the bloated feeling in your belly (abdomen).  No driving for 24 hours (because of the medicine (anesthesia) used during the test).   Do not sign any important legal documents or operate any machinery for 24 hours (because of the anesthesia used during the test).  NUTRITION Drink plenty of fluids.  You may resume your normal diet as instructed by your doctor.  Begin with a light meal and progress to your normal diet. Heavy or fried foods are harder to digest and may make you feel sick to your stomach (nauseated).  Avoid alcoholic beverages for 24 hours or as instructed.  MEDICATIONS You may resume your normal medications unless your doctor tells you otherwise.  WHAT YOU CAN EXPECT TODAY Some feelings of bloating in the abdomen.  Passage of more gas than usual.  Spotting of blood in your stool or on the toilet paper.  IF YOU HAD POLYPS REMOVED DURING THE COLONOSCOPY: No aspirin products for 7 days or as instructed.  No alcohol for 7 days or as instructed.  Eat a soft diet for the next 24 hours.  FINDING OUT THE RESULTS OF YOUR TEST Not all test results are available during your visit. If your test results are not back during the visit, make an appointment with your caregiver to find out the  results. Do not assume everything is normal if you have not heard from your caregiver or the medical facility. It is important for you to follow up on all of your test results.  SEEK IMMEDIATE MEDICAL ATTENTION IF: You have more than a spotting of blood in your stool.  Your belly is swollen (abdominal distention).  You are nauseated or vomiting.  You have a temperature over 101.  You have abdominal pain or discomfort that is severe or gets worse throughout the day.    Hemorrhoids and diverticulosis only found today  Continue regular fiber supplement.  Avoid straining at the time of bowel function  Office visit with Roseanne Kaufman in 2 months if not already scheduled -  possibly 1 more banding session.  Repeat high rescreening colonoscopy in 5 years  Request I called Dare Sanger at [phone] -  reviewed findings and recommendations  PATIENT INSTRUCTIONS POST-ANESTHESIA  IMMEDIATELY FOLLOWING SURGERY:  Do not drive or operate machinery for the first twenty four hours after surgery.  Do not make any important decisions for twenty four hours after surgery or while taking narcotic pain medications or sedatives.  If you develop intractable nausea and vomiting or a severe headache please notify your doctor immediately.  FOLLOW-UP:  Please make an appointment with your surgeon as instructed. You do not need to follow up with anesthesia unless specifically instructed to do so.  WOUND CARE INSTRUCTIONS (if applicable):  Keep a dry clean dressing on the anesthesia/puncture wound site if there is drainage.  Once the wound has quit draining you may leave it open to air.  Generally you should leave the bandage intact for twenty four hours unless there is drainage.  If the epidural site drains for more than 36-48 hours please call the anesthesia department.  QUESTIONS?:  Please feel free to call your physician or the hospital operator if you have any questions, and they will be happy to assist you.

## 2021-06-28 ENCOUNTER — Encounter (HOSPITAL_COMMUNITY): Payer: Self-pay | Admitting: Internal Medicine

## 2021-07-05 ENCOUNTER — Encounter: Payer: Self-pay | Admitting: Family Medicine

## 2021-07-05 ENCOUNTER — Other Ambulatory Visit: Payer: Self-pay

## 2021-07-05 ENCOUNTER — Ambulatory Visit (INDEPENDENT_AMBULATORY_CARE_PROVIDER_SITE_OTHER): Payer: Medicare Other | Admitting: Family Medicine

## 2021-07-05 VITALS — BP 143/88 | HR 73 | Temp 98.3°F | Ht 72.0 in | Wt 198.2 lb

## 2021-07-05 DIAGNOSIS — I10 Essential (primary) hypertension: Secondary | ICD-10-CM | POA: Diagnosis not present

## 2021-07-05 DIAGNOSIS — Z800 Family history of malignant neoplasm of digestive organs: Secondary | ICD-10-CM

## 2021-07-05 DIAGNOSIS — B0229 Other postherpetic nervous system involvement: Secondary | ICD-10-CM

## 2021-07-05 NOTE — Progress Notes (Signed)
Subjective: CC: HTN PCP: [name], DO YIR:SWNIO Ralph Stevens is a 70 y.o. male presenting to clinic today for:  1. HTN Compliant with his medications.  No chest pain, shortness of breath.  2.  Family history of colon cancer Patient reports that his sister was recently diagnosed with stage III colon cancer after having had a totally normal colonoscopy 3 years prior.  She is doing well.  He has since had a colonoscopy and it was good.  He notes that rectal bleeding has improved after his last banding procedure.  3.  Postherpetic neuralgia Patient reports that essentially all the symptoms have resolved on the left side.  He is ready to get his shingles vaccination but wants to hold off until the first year since it will be covered fully by Medicare.  May or may not need surgical intervention of the retina on that side given history of retinal tear.  There was some irregularity that his ophthalmologist is watching after  ROS: Per HPI  Allergies  Allergen Reactions   Gabapentin Hives   Past Medical History:  Diagnosis Date   Acid reflux    Arthritis    Arthritis    Cataract    Mixed form OU   Enlarged prostate    PSA goes up and down    GERD (gastroesophageal reflux disease)    High cholesterol    Hyperlipidemia    Hypertension    Hypertensive retinopathy    OU    Current Outpatient Medications:    acetaminophen (TYLENOL) 500 MG tablet, Take 1,000 mg by mouth every 6 (six) hours as needed for moderate pain or headache., Disp: , Rfl:    alfuzosin (UROXATRAL) 10 MG 24 hr tablet, Take 10 mg by mouth every evening., Disp: , Rfl:    amLODipine (NORVASC) 5 MG tablet, Take 1 tablet (5 mg total) by mouth daily., Disp: 90 tablet, Rfl: 3   aspirin 81 MG chewable tablet, Chew 81 mg by mouth in the morning. , Disp: , Rfl:    atorvastatin (LIPITOR) 20 MG tablet, Take 1 tablet (20 mg total) by mouth daily. (Patient taking differently: Take 20 mg by mouth every evening.), Disp:  90 tablet, Rfl: 3   esomeprazole (NEXIUM) 20 MG capsule, Take 20 mg by mouth in the morning. , Disp: , Rfl:    fluticasone (FLONASE) 50 MCG/ACT nasal spray, Place 1-2 sprays into both nostrils daily as needed for allergies or rhinitis., Disp: 48 mL, Rfl: 3   ibuprofen (ADVIL) 200 MG tablet, Take 600 mg by mouth every 6 (six) hours as needed for headache or moderate pain., Disp: , Rfl:    lisinopril (ZESTRIL) 40 MG tablet, Take 1 tablet (40 mg total) by mouth daily., Disp: 90 tablet, Rfl: 3   metoprolol tartrate (LOPRESSOR) 50 MG tablet, Take 1 tablet (50 mg total) by mouth 2 (two) times daily., Disp: 180 tablet, Rfl: 3   polyethylene glycol-electrolytes (NULYTELY) 420 g solution, As directed, Disp: 4000 mL, Rfl: 0 Social History   Socioeconomic History   Marital status: Married    Spouse name: [name]   Number of children: 3   Years of education: Not on file   Highest education level: Not on file  Occupational History    Comment: semi-retired   Tobacco Use   Smoking status: Never   Smokeless tobacco: Never  Vaping Use   Vaping Use: Never used  Substance and Sexual Activity   Alcohol use: Never   Drug use: Never  Sexual activity: Not on file  Other Topics Concern   Not on file  Social History Narrative   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  He currently is unemployed but is actively looking for work as he enjoys being busy.   He recently relocated from [place] and resides locally with his wife.  He has a son that lives in [place] but his other 2 daughters are residing in [place].   Social Determinants of Health   Financial Resource Strain: Not on file  Food Insecurity: Not on file  Transportation Needs: Not on file  Physical Activity: Not on file  Stress: Not on file  Social Connections: Not on file  Intimate Partner Violence: Not on file   Family History  Problem Relation Age of Onset   Cancer Mother    Colon cancer Mother     Dementia Mother    Alzheimer's disease Mother    Emphysema Father    Hypothyroidism Sister    Thyroid disease Sister    Hypertension Sister    Multiple sclerosis Daughter    Heart defect Daughter    Colon cancer Maternal Grandmother    Heart disease Maternal Grandfather    Heart disease Paternal Grandmother    Cancer Paternal Grandfather        unknown ( mouth/throat)   Colon cancer Maternal Uncle     Objective: Office vital signs reviewed. BP (!) 143/88   Pulse 73   Temp 98.3 F (36.8 C)   Ht 6' (1.829 m)   Wt 198 lb 3.2 oz (89.9 kg)   SpO2 96%   BMI 26.88 kg/m   Physical Examination:  General: Awake, alert, well nourished, No acute distress HEENT: Normal; sclera white.  Moist mucous membranes.  No carotid bruits Cardio: regular rate and rhythm, S1S2 heard, no murmurs appreciated Pulm: clear to auscultation bilaterally, no wheezes, rhonchi or rales; normal work of breathing on room air GI: soft, non-tender, non-distended, bowel sounds present x4, no hepatomegaly, no splenomegaly, no masses  Assessment/ Plan: 70 y.o. male   Essential hypertension  Post herpetic neuralgia  Family history of colon cancer  Blood pressure is at goal upon recheck.  No changes  Has healed well after shingles on the left eye.  Keep appointments with ophthalmology  Continue close surveillance for colon cancer given strong family history of colon cancer that appears to have be rapidly and aggressively growing in his family members  We have set him up with a nurses visit for first shingles shot.  We can see each other back in 6 months  No orders of the defined types were placed in this encounter.  No orders of the defined types were placed in this encounter.    [name], DO [place] [phone]

## 2021-07-26 NOTE — Progress Notes (Signed)
History of Present Illness:     2.2.2021: Here today referred by Dr Lajuana Ripple for follow-up on his elevated PSA and 'abnormally shaped' prostate w/ mild LUTS. He is currently on tamsulosin -- previously on dual medical therapy w/ alfuzosin but stopped because he was having issues with it in combination w/ HCTZ (peeing too frequently). His PSA has apparently fluctuated over the last few years (at one point he thinks it was up to 6, but it came back down to what he says was normal for him), but he does not have any old records for these data.    6.8.2021: Here for follow-up after catheter removal 2 wks prior. He has been on alfuzosin 2x daily since last visit and feels as though his urinary pattern and sx's are significantly improved. He still thinks his stream is weaker than before his recently retentive episode but feels as though this is still improving. He does have some dizziness.  12.7.2021: Pt here after septic bacterial infection. He notes that he has lingering fatigue but is otherwise recovering well. He continues on alfuzosin but notes significant urinary frequency during the day. He notes a strong FOS with a full bladder but notes that the force diminishes while emptying his bladder. He reports that he is able to suppress urinary urge without issue and denies any recent foul-smelling urine or dysuria.   PSA: 3.0 (10.7.2021)  12.6.2022: He is still on alfuzosin, once a day.  IPSS 10, quality-of-life score 2.  No recent PSA  He asked for refill of his alfuzosin as well as prostaglandin e- 1 Past Medical History:  Diagnosis Date   Acid reflux    Arthritis    Arthritis    Cataract    Mixed form OU   Enlarged prostate    PSA goes up and down    GERD (gastroesophageal reflux disease)    High cholesterol    Hyperlipidemia    Hypertension    Hypertensive retinopathy    OU    Past Surgical History:  Procedure Laterality Date   colonoscopy  2019   internal hemorrhoids and  pancolonic diverticulosis. Surveillance due in 2024 due to history of adenomatous colon polyps and family history.   COLONOSCOPY N/A 06/23/2021   Procedure: COLONOSCOPY;  Surgeon: Daneil Dolin, MD;  Location: [facility];  Service: Endoscopy;  Laterality: N/A;  10:30am   EYE SURGERY     TONSILLECTOMY     age 70     Home Medications:  Allergies as of 07/27/2021       Reactions   Gabapentin Hives        Medication List        Accurate as of July 26, 2021  7:30 PM. If you have any questions, ask your nurse or doctor.          acetaminophen 500 MG tablet Commonly known as: TYLENOL Take 1,000 mg by mouth every 6 (six) hours as needed for moderate pain or headache.   alfuzosin 10 MG 24 hr tablet Commonly known as: UROXATRAL Take 10 mg by mouth every evening.   amLODipine 5 MG tablet Commonly known as: NORVASC Take 1 tablet (5 mg total) by mouth daily.   aspirin 81 MG chewable tablet Chew 81 mg by mouth in the morning.   atorvastatin 20 MG tablet Commonly known as: LIPITOR Take 1 tablet (20 mg total) by mouth daily. What changed: when to take this   esomeprazole 20 MG capsule Commonly known as: NEXIUM Take  20 mg by mouth in the morning.   fluticasone 50 MCG/ACT nasal spray Commonly known as: FLONASE Place 1-2 sprays into both nostrils daily as needed for allergies or rhinitis.   ibuprofen 200 MG tablet Commonly known as: ADVIL Take 600 mg by mouth every 6 (six) hours as needed for headache or moderate pain.   lisinopril 40 MG tablet Commonly known as: ZESTRIL Take 1 tablet (40 mg total) by mouth daily.   metoprolol tartrate 50 MG tablet Commonly known as: LOPRESSOR Take 1 tablet (50 mg total) by mouth 2 (two) times daily.   polyethylene glycol-electrolytes 420 g solution Commonly known as: NuLYTELY As directed        Allergies:  Allergies  Allergen Reactions   Gabapentin Hives    Family History  Problem Relation Age of Onset   Cancer  Mother    Colon cancer Mother    Dementia Mother    Alzheimer's disease Mother    Emphysema Father    Hypothyroidism Sister    Thyroid disease Sister    Hypertension Sister    Multiple sclerosis Daughter    Heart defect Daughter    Colon cancer Maternal Grandmother    Heart disease Maternal Grandfather    Heart disease Paternal Grandmother    Cancer Paternal Grandfather        unknown ( mouth/throat)   Colon cancer Maternal Uncle     Social History:  reports that he has never smoked. He has never used smokeless tobacco. He reports that he does not drink alcohol and does not use drugs.  ROS: A complete review of systems was performed.  All systems are negative except for pertinent findings as noted.  Physical Exam:  Vital signs in last 24 hours: There were no vitals taken for this visit. Constitutional:  Alert and oriented, No acute distress Cardiovascular: Regular rate  Respiratory: Normal respiratory effort GI: Abdomen is soft, nontender, nondistended, no abdominal masses. No CVAT.  Genitourinary: Normal male phallus, testes are descended bilaterally and non-tender and without masses, scrotum is normal in appearance without lesions or masses, perineum is normal on inspection.  Normal anal sphincter tone.  Prostate 60 mL, symmetric, nonnodular, nontender. Lymphatic: No lymphadenopathy Neurologic: Grossly intact, no focal deficits Psychiatric: Normal mood and affect  I have reviewed prior pt notes  I have reviewed notes from referring/previous physicians  I have reviewed urinalysis results  I have reviewed prior PSA results   Impression/Assessment:  1.  BPH, doing well on alfuzosin  2.  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  Continue alfuzosin  2.  PSA checked today  3.  Prescription for prostaglandin E1, 20 mcg, #5 vials with as needed refills  I will see back in 1 year

## 2021-07-27 ENCOUNTER — Ambulatory Visit: Payer: Medicare Other | Admitting: Urology

## 2021-07-27 ENCOUNTER — Encounter: Payer: Self-pay | Admitting: Urology

## 2021-07-27 ENCOUNTER — Other Ambulatory Visit: Payer: Self-pay

## 2021-07-27 VITALS — BP 177/89 | HR 80

## 2021-07-27 DIAGNOSIS — N3000 Acute cystitis without hematuria: Secondary | ICD-10-CM

## 2021-07-27 DIAGNOSIS — R350 Frequency of micturition: Secondary | ICD-10-CM

## 2021-07-27 DIAGNOSIS — N401 Enlarged prostate with lower urinary tract symptoms: Secondary | ICD-10-CM | POA: Diagnosis not present

## 2021-07-27 LAB — MICROSCOPIC EXAMINATION
Bacteria, UA: NONE SEEN
Renal Epithel, UA: NONE SEEN /hpf
WBC, UA: NONE SEEN /hpf (ref 0–5)

## 2021-07-27 LAB — URINALYSIS, ROUTINE W REFLEX MICROSCOPIC
Bilirubin, UA: NEGATIVE
Glucose, UA: NEGATIVE
Leukocytes,UA: NEGATIVE
Nitrite, UA: NEGATIVE
Specific Gravity, UA: 1.025 (ref 1.005–1.030)
Urobilinogen, Ur: 0.2 mg/dL (ref 0.2–1.0)
pH, UA: 6 (ref 5.0–7.5)

## 2021-07-27 MED ORDER — ALFUZOSIN HCL ER 10 MG PO TB24: 10.0000 mg | ORAL_TABLET | Freq: Every evening | ORAL | 3 refills | Status: DC

## 2021-07-27 NOTE — Progress Notes (Signed)
Urological Symptom Review  Patient is experiencing the following symptoms: Weak stream Erection problems (male only)   Review of Systems  Gastrointestinal (upper)  : Negative for upper GI symptoms  Gastrointestinal (lower) : Negative for lower GI symptoms  Constitutional : Negative for symptoms  Skin: Negative for skin symptoms  Eyes: Negative for eye symptoms  Ear/Nose/Throat : Negative for Ear/Nose/Throat symptoms  Hematologic/Lymphatic: Negative for Hematologic/Lymphatic symptoms  Cardiovascular : Negative for cardiovascular symptoms  Respiratory : Negative for respiratory symptoms  Endocrine: Negative for endocrine symptoms  Musculoskeletal: Back pain Joint pain  Neurological: Negative for neurological symptoms  Psychologic: Negative for psychiatric symptoms

## 2021-07-28 LAB — PSA: Prostate Specific Ag, Serum: 4.3 ng/mL — ABNORMAL HIGH (ref 0.0–4.0)

## 2021-09-06 ENCOUNTER — Ambulatory Visit (INDEPENDENT_AMBULATORY_CARE_PROVIDER_SITE_OTHER): Payer: Medicare Other

## 2021-09-06 DIAGNOSIS — Z23 Encounter for immunization: Secondary | ICD-10-CM

## 2021-09-22 ENCOUNTER — Ambulatory Visit (INDEPENDENT_AMBULATORY_CARE_PROVIDER_SITE_OTHER): Payer: Medicare Other

## 2021-09-22 VITALS — Wt 193.0 lb

## 2021-09-22 DIAGNOSIS — Z0000 Encounter for general adult medical examination without abnormal findings: Secondary | ICD-10-CM | POA: Diagnosis not present

## 2021-09-22 NOTE — Progress Notes (Signed)
Subjective:   Ralph Stevens is a 71 y.o. male who presents for Medicare Annual/Subsequent preventive examination.  Virtual Visit via Telephone Note  I connected with  Ralph Stevens on 09/22/21 at 10:30 AM EST by telephone and verified that I am speaking with the correct person using two identifiers.  Location: Patient: Home Provider: [facility] Persons participating in the virtual visit: patient/Nurse Health Advisor   I discussed the limitations, risks, security and privacy concerns of performing an evaluation and management service by telephone and the availability of in person appointments. The patient expressed understanding and agreed to proceed.  Interactive audio and video telecommunications were attempted between this nurse and patient, however failed, due to patient having technical difficulties OR patient did not have access to video capability.  We continued and completed visit with audio only.  Some vital signs may be absent or patient reported.   Jayzen Paver E Rashonda Warrior, LPN   Review of Systems     Cardiac Risk Factors include: advanced age (>7men, >16 women);male gender;dyslipidemia     Objective:    Today's Vitals   09/22/21 1030  Weight: 193 lb (87.5 kg)   Body mass index is 26.18 kg/m.  Advanced Directives 09/22/2021 06/23/2021 09/21/2020 01/01/2020 09/20/2019  Does Patient Have a Medical Advance Directive? No No No No No  Would patient like information on creating a medical advance directive? Yes (MAU/Ambulatory/Procedural Areas - Information given) No - Patient declined No - Patient declined No - Patient declined No - Patient declined    Current Medications (verified) Outpatient Encounter Medications as of 09/22/2021  Medication Sig   acetaminophen (TYLENOL) 500 MG tablet Take 1,000 mg by mouth every 6 (six) hours as needed for moderate pain or headache.   alfuzosin (UROXATRAL) 10 MG 24 hr tablet Take 1 tablet (10 mg total) by mouth every evening.   amLODipine  (NORVASC) 5 MG tablet Take 1 tablet (5 mg total) by mouth daily.   aspirin 81 MG chewable tablet Chew 81 mg by mouth in the morning.    atorvastatin (LIPITOR) 20 MG tablet Take 1 tablet (20 mg total) by mouth daily. (Patient taking differently: Take 20 mg by mouth every evening.)   esomeprazole (NEXIUM) 20 MG capsule Take 20 mg by mouth in the morning.    fluticasone (FLONASE) 50 MCG/ACT nasal spray Place 1-2 sprays into both nostrils daily as needed for allergies or rhinitis.   ibuprofen (ADVIL) 200 MG tablet Take 600 mg by mouth every 6 (six) hours as needed for headache or moderate pain.   lisinopril (ZESTRIL) 40 MG tablet Take 1 tablet (40 mg total) by mouth daily.   metoprolol tartrate (LOPRESSOR) 50 MG tablet Take 1 tablet (50 mg total) by mouth 2 (two) times daily.   polyethylene glycol-electrolytes (NULYTELY) 420 g solution As directed   No facility-administered encounter medications on file as of 09/22/2021.    Allergies (verified) Gabapentin   History: Past Medical History:  Diagnosis Date   Acid reflux    Arthritis    Arthritis    Cataract    Mixed form OU   Enlarged prostate    PSA goes up and down    GERD (gastroesophageal reflux disease)    High cholesterol    Hyperlipidemia    Hypertension    Hypertensive retinopathy    OU   Past Surgical History:  Procedure Laterality Date   colonoscopy  2019   internal hemorrhoids and pancolonic diverticulosis. Surveillance due in 2024 due to history of  adenomatous colon polyps and family history.   COLONOSCOPY N/A 06/23/2021   Procedure: COLONOSCOPY;  Surgeon: Daneil Dolin, MD;  [facility];  Service: Endoscopy;  Laterality: N/A;  10:30am   EYE SURGERY     TONSILLECTOMY     age 59    Family History  Problem Relation Age of Onset   Cancer Mother    Colon cancer Mother    Dementia Mother    Alzheimer's disease Mother    Emphysema Father    Hypothyroidism Sister    Thyroid disease Sister    Hypertension  Sister    Multiple sclerosis Daughter    Heart defect Daughter    Colon cancer Maternal Grandmother    Heart disease Maternal Grandfather    Heart disease Paternal Grandmother    Cancer Paternal Grandfather        unknown ( mouth/throat)   Colon cancer Maternal Uncle    Social History   Socioeconomic History   Marital status: Married    Spouse name: [name]   Number of children: 3   Years of education: Not on file   Highest education level: Not on file  Occupational History    Comment: semi-retired   Tobacco Use   Smoking status: Never   Smokeless tobacco: Never  Vaping Use   Vaping Use: Never used  Substance and Sexual Activity   Alcohol use: Never   Drug use: Never   Sexual activity: Not on file  Other Topics Concern   Not on file  Social History Narrative   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  He currently is unemployed but is actively looking for work as he enjoys being busy.   He recently relocated from [place] and resides locally with his wife.  He has a son that lives in [place] but his other 2 daughters are residing in [place].   Social Determinants of Health   Financial Resource Strain: Low Risk    Difficulty of Paying Living Expenses: Not very hard  Food Insecurity: No Food Insecurity   Worried About Charity fundraiser in the Last Year: Never true   Ran Out of Food in the Last Year: Never true  Transportation Needs: No Transportation Needs   Lack of Transportation (Medical): No   Lack of Transportation (Non-Medical): No  Physical Activity: Sufficiently Active   Days of Exercise per Week: 7 days   Minutes of Exercise per Session: 30 min  Stress: No Stress Concern Present   Feeling of Stress : Not at all  Social Connections: Moderately Isolated   Frequency of Communication with Friends and Family: More than three times a week   Frequency of Social Gatherings with Friends and Family: More than three times a  week   Attends Religious Services: Never   Marine scientist or Organizations: No   Attends Music therapist: Never   Marital Status: Married    Tobacco Counseling Counseling given: Not Answered   Clinical Intake:  Pre-visit preparation completed: Yes  Pain : No/denies pain     BMI - recorded: 26.18 Nutritional Status: BMI 25 -29 Overweight Nutritional Risks: None Diabetes: No  How often do you need to have someone help you when you read instructions, pamphlets, or other written materials from your doctor or pharmacy?: 1 - Never  Diabetic? no  Interpreter Needed?: No  Information entered by :: Rubie Ficco, LPN   Activities of  Daily Living In your present state of health, do you have any difficulty performing the following activities: 09/22/2021  Hearing? Y  Comment wears hearing aids  Vision? N  Difficulty concentrating or making decisions? N  Walking or climbing stairs? N  Dressing or bathing? N  Doing errands, shopping? N  Preparing Food and eating ? N  Using the Toilet? N  In the past six months, have you accidently leaked urine? N  Do you have problems with loss of bowel control? N  Managing your Medications? N  Managing your Finances? N  Housekeeping or managing your Housekeeping? N  Some recent data might be hidden    Patient Care Team: [name], DO as PCP - General (Family Medicine) Franchot Gallo, MD as Consulting Physician (Urology) Bernarda Caffey, MD as Consulting Physician (Ophthalmology) Allyn Kenner, MD (Dermatology) Gala Romney Cristopher Estimable, MD as Consulting Physician (Gastroenterology)  Indicate any recent Medical Services you may have received from other than [facility] providers in the past year (date may be approximate).     Assessment:   This is a routine wellness examination for [place].  Hearing/Vision screen Hearing Screening - Comments:: Wears hearing aids -no regular ENT or audiologist Vision Screening - Comments::  Wears rx glasses - up to date with annual eye exams with [facility] - also seeing Coralyn Pear  Dietary issues and exercise activities discussed: Current Exercise Habits: Home exercise routine, Type of exercise: walking, Time (Minutes): 30, Frequency (Times/Week): 7, Weekly Exercise (Minutes/Week): 210, Intensity: Mild, Exercise limited by: None identified   Goals Addressed             This Visit's Progress    Patient Stated   On track    09/22/2021 AWV Goal: Fall Prevention  Over the next year, patient will decrease their risk for falls by: Using assistive devices, such as a cane or walker, as needed Identifying fall risks within their home and correcting them by: Removing throw rugs Adding handrails to stairs or ramps Removing clutter and keeping a clear pathway throughout the home Increasing light, especially at night Adding shower handles/bars Raising toilet seat Identifying potential personal risk factors for falls: Medication side effects Incontinence/urgency Vestibular dysfunction Hearing loss Musculoskeletal disorders Neurological disorders Orthostatic hypotension       Prevent falls   On track    Stay active / healthy        Depression Screen PHQ 2/9 Scores 09/22/2021 07/05/2021 03/03/2021 11/09/2020 09/21/2020 09/02/2020 03/02/2020  PHQ - 2 Score 0 0 0 0 0 0 0  PHQ- 9 Score - - - - - 0 0    Fall Risk Fall Risk  09/22/2021 07/05/2021 03/03/2021 11/09/2020 09/21/2020  Falls in the past year? 0 1 0 0 0  Number falls in past yr: 0 0 - - -  Injury with Fall? 0 0 - - -  Risk for fall due to : No Fall Risks History of fall(s) - - -  Follow up Falls prevention discussed Education provided - - Falls evaluation completed    FALL RISK PREVENTION PERTAINING TO THE HOME:  Any stairs in or around the home? Yes  If so, are there any without handrails? No  Home free of loose throw rugs in walkways, pet beds, electrical cords, etc? Yes  Adequate lighting in your home to reduce  risk of falls? Yes   ASSISTIVE DEVICES UTILIZED TO PREVENT FALLS:  Life alert? No  Use of a cane, walker or w/c? No  Grab bars in the  bathroom? No  Shower chair or bench in shower? No  Elevated toilet seat or a handicapped toilet? No   TIMED UP AND GO:  Was the test performed? No . Telephonic visit  Cognitive Function: Normal cognitive status assessed by direct observation by this Nurse Health Advisor. No abnormalities found.       6CIT Screen 09/21/2020 09/20/2019  What Year? 0 points 0 points  What month? 0 points 0 points  What time? 0 points 0 points  Count back from 20 0 points 0 points  Months in reverse 0 points 0 points  Repeat phrase 0 points 2 points  Total Score 0 2    Immunizations Immunization History  Administered Date(s) Administered   Moderna Sars-Covid-2 Vaccination 10/30/2019, 11/27/2019, 08/18/2020   Zoster Recombinat (Shingrix) 09/06/2021    TDAP status: Due, Education has been provided regarding the importance of this vaccine. Advised may receive this vaccine at local pharmacy or Health Dept. Aware to provide a copy of the vaccination record if obtained from local pharmacy or Health Dept. Verbalized acceptance and understanding.  Flu Vaccine status: Declined, Education has been provided regarding the importance of this vaccine but patient still declined. Advised may receive this vaccine at local pharmacy or Health Dept. Aware to provide a copy of the vaccination record if obtained from local pharmacy or Health Dept. Verbalized acceptance and understanding.  Pneumococcal vaccine status: Declined,  Education has been provided regarding the importance of this vaccine but patient still declined. Advised may receive this vaccine at local pharmacy or Health Dept. Aware to provide a copy of the vaccination record if obtained from local pharmacy or Health Dept. Verbalized acceptance and understanding.   Covid-19 vaccine status: Completed vaccines  Qualifies for  Shingles Vaccine? Yes   Zostavax completed No   Shingrix Completed?: No.    Education has been provided regarding the importance of this vaccine. Patient has been advised to call insurance company to determine out of pocket expense if they have not yet received this vaccine. Advised may also receive vaccine at local pharmacy or Health Dept. Verbalized acceptance and understanding.  Screening Tests Health Maintenance  Topic Date Due   TETANUS/TDAP  Never done   COVID-19 Vaccine (4 - Booster for Moderna series) 10/13/2020   Hepatitis C Screening  11/09/2021 (Originally 11/05/1968)   INFLUENZA VACCINE  11/19/2021 (Originally 03/22/2021)   Pneumonia Vaccine 76+ Years old (1 - PCV) 07/05/2022 (Originally 11/05/1956)   Zoster Vaccines- Shingrix (2 of 2) 11/01/2021   COLONOSCOPY (Pts 45-25yrs Insurance coverage will need to be confirmed)  06/24/2031   HPV VACCINES  Aged Out    Health Maintenance  Health Maintenance Due  Topic Date Due   TETANUS/TDAP  Never done   COVID-19 Vaccine (4 - Booster for Moderna series) 10/13/2020    Colorectal cancer screening: Type of screening: Colonoscopy. Completed 06/23/2021. Repeat every 10 years  Lung Cancer Screening: (Low Dose CT Chest recommended if Age 21-80 years, 30 pack-year currently smoking OR have quit w/in 15years.) does not qualify.   Additional Screening:  Hepatitis C Screening: does qualify; Due  Vision Screening: Recommended annual ophthalmology exams for early detection of glaucoma and other disorders of the eye. Is the patient up to date with their annual eye exam?  Yes  Who is the provider or what is the name of the office in which the patient attends annual eye exams? [facility] If pt is not established with a provider, would they like to be referred to a provider  to establish care? No .   Dental Screening: Recommended annual dental exams for proper oral hygiene  Community Resource Referral / Chronic Care Management: CRR required  this visit?  No   CCM required this visit?  No      Plan:     I have personally reviewed and noted the following in the patients chart:   Medical and social history Use of alcohol, tobacco or illicit drugs  Current medications and supplements including opioid prescriptions. Patient is not currently taking opioid prescriptions. Functional ability and status Nutritional status Physical activity Advanced directives List of other physicians Hospitalizations, surgeries, and ER visits in previous 12 months Vitals Screenings to include cognitive, depression, and falls Referrals and appointments  In addition, I have reviewed and discussed with patient certain preventive protocols, quality metrics, and best practice recommendations. A written personalized care plan for preventive services as well as general preventive health recommendations were provided to patient.     Sandrea Hammond, LPN   [id number]/[id number]   Nurse Notes: None

## 2021-09-22 NOTE — Patient Instructions (Addendum)
Ralph Stevens , Thank you for taking time to come for your Medicare Wellness Visit. I appreciate your ongoing commitment to your health goals. Please review the following plan we discussed and let me know if I can assist you in the future.   Screening recommendations/referrals: Colonoscopy: Done 06/23/2021 - Repeat in 10 years  Recommended yearly ophthalmology/optometry visit for glaucoma screening and checkup Recommended yearly dental visit for hygiene and checkup  Vaccinations: Influenza vaccine: Due every fall Pneumococcal vaccine: Due - 2 doses one year apart Tdap vaccine: Due every 10 years Shingles vaccine: Done 09/06/2021 - get second dose in 2-6 months   Covid-19: Done 10/30/2019, 11/27/2019, & 08/18/2020  Advanced directives: Advance directive discussed with you today. I have provided a copy for you to complete at home and have notarized. Once this is complete please bring a copy in to our office so we can scan it into your chart.   Conditions/risks identified: Keep up the great work!  Stay active and drink plenty of water  Next appointment: Follow up in one year for your annual wellness visit.   Preventive Care 70 Years and Older, Male  Preventive care refers to lifestyle choices and visits with your health care provider that can promote health and wellness. What does preventive care include? A yearly physical exam. This is also called an annual well check. Dental exams once or twice a year. Routine eye exams. Ask your health care provider how often you should have your eyes checked. Personal lifestyle choices, including: Daily care of your teeth and gums. Regular physical activity. Eating a healthy diet. Avoiding tobacco and drug use. Limiting alcohol use. Practicing safe sex. Taking low doses of aspirin every day. Taking vitamin and mineral supplements as recommended by your health care provider. What happens during an annual well check? The services and screenings done by  your health care provider during your annual well check will depend on your age, overall health, lifestyle risk factors, and family history of disease. Counseling  Your health care provider may ask you questions about your: Alcohol use. Tobacco use. Drug use. Emotional well-being. Home and relationship well-being. Sexual activity. Eating habits. History of falls. Memory and ability to understand (cognition). Work and work Statistician. Screening  You may have the following tests or measurements: Height, weight, and BMI. Blood pressure. Lipid and cholesterol levels. These may be checked every 5 years, or more frequently if you are over 61 years old. Skin check. Lung cancer screening. You may have this screening every year starting at age 70 if you have a 30-pack-year history of smoking and currently smoke or have quit within the past 15 years. Fecal occult blood test (FOBT) of the stool. You may have this test every year starting at age 70. Flexible sigmoidoscopy or colonoscopy. You may have a sigmoidoscopy every 5 years or a colonoscopy every 10 years starting at age 70. Prostate cancer screening. Recommendations will vary depending on your family history and other risks. Hepatitis C blood test. Hepatitis B blood test. Sexually transmitted disease (STD) testing. Diabetes screening. This is done by checking your blood sugar (glucose) after you have not eaten for a while (fasting). You may have this done every 1-3 years. Abdominal aortic aneurysm (AAA) screening. You may need this if you are a current or former smoker. Osteoporosis. You may be screened starting at age 70 if you are at high risk. Talk with your health care provider about your test results, treatment options, and if necessary, the need  for more tests. Vaccines  Your health care provider may recommend certain vaccines, such as: Influenza vaccine. This is recommended every year. Tetanus, diphtheria, and acellular pertussis  (Tdap, Td) vaccine. You may need a Td booster every 10 years. Zoster vaccine. You may need this after age 70. Pneumococcal 13-valent conjugate (PCV13) vaccine. One dose is recommended after age 70. Pneumococcal polysaccharide (PPSV23) vaccine. One dose is recommended after age 29. Talk to your health care provider about which screenings and vaccines you need and how often you need them. This information is not intended to replace advice given to you by your health care provider. Make sure you discuss any questions you have with your health care provider. Document Released: 09/04/2015 Document Revised: 04/27/2016 Document Reviewed: 06/09/2015 Elsevier Interactive Patient Education  2017 [place] Prevention in the Home Falls can cause injuries. They can happen to people of all ages. There are many things you can do to make your home safe and to help prevent falls. What can I do on the outside of my home? Regularly fix the edges of walkways and driveways and fix any cracks. Remove anything that might make you trip as you walk through a door, such as a raised step or threshold. Trim any bushes or trees on the path to your home. Use bright outdoor lighting. Clear any walking paths of anything that might make someone trip, such as rocks or tools. Regularly check to see if handrails are loose or broken. Make sure that both sides of any steps have handrails. Any raised decks and porches should have guardrails on the edges. Have any leaves, snow, or ice cleared regularly. Use sand or salt on walking paths during winter. Clean up any spills in your garage right away. This includes oil or grease spills. What can I do in the bathroom? Use night lights. Install grab bars by the toilet and in the tub and shower. Do not use towel bars as grab bars. Use non-skid mats or decals in the tub or shower. If you need to sit down in the shower, use a plastic, non-slip stool. Keep the floor dry. Clean  up any water that spills on the floor as soon as it happens. Remove soap buildup in the tub or shower regularly. Attach bath mats securely with double-sided non-slip rug tape. Do not have throw rugs and other things on the floor that can make you trip. What can I do in the bedroom? Use night lights. Make sure that you have a light by your bed that is easy to reach. Do not use any sheets or blankets that are too big for your bed. They should not hang down onto the floor. Have a firm chair that has side arms. You can use this for support while you get dressed. Do not have throw rugs and other things on the floor that can make you trip. What can I do in the kitchen? Clean up any spills right away. Avoid walking on wet floors. Keep items that you use a lot in easy-to-reach places. If you need to reach something above you, use a strong step stool that has a grab bar. Keep electrical cords out of the way. Do not use floor polish or wax that makes floors slippery. If you must use wax, use non-skid floor wax. Do not have throw rugs and other things on the floor that can make you trip. What can I do with my stairs? Do not leave any items on the stairs.  Make sure that there are handrails on both sides of the stairs and use them. Fix handrails that are broken or loose. Make sure that handrails are as long as the stairways. Check any carpeting to make sure that it is firmly attached to the stairs. Fix any carpet that is loose or worn. Avoid having throw rugs at the top or bottom of the stairs. If you do have throw rugs, attach them to the floor with carpet tape. Make sure that you have a light switch at the top of the stairs and the bottom of the stairs. If you do not have them, ask someone to add them for you. What else can I do to help prevent falls? Wear shoes that: Do not have high heels. Have rubber bottoms. Are comfortable and fit you well. Are closed at the toe. Do not wear sandals. If you  use a stepladder: Make sure that it is fully opened. Do not climb a closed stepladder. Make sure that both sides of the stepladder are locked into place. Ask someone to hold it for you, if possible. Clearly mark and make sure that you can see: Any grab bars or handrails. First and last steps. Where the edge of each step is. Use tools that help you move around (mobility aids) if they are needed. These include: Canes. Walkers. Scooters. Crutches. Turn on the lights when you go into a dark area. Replace any light bulbs as soon as they burn out. Set up your furniture so you have a clear path. Avoid moving your furniture around. If any of your floors are uneven, fix them. If there are any pets around you, be aware of where they are. Review your medicines with your doctor. Some medicines can make you feel dizzy. This can increase your chance of falling. Ask your doctor what other things that you can do to help prevent falls. This information is not intended to replace advice given to you by your health care provider. Make sure you discuss any questions you have with your health care provider. Document Released: 06/04/2009 Document Revised: 01/14/2016 Document Reviewed: 09/12/2014 Elsevier Interactive Patient Education  2017 [employer].

## 2021-09-28 ENCOUNTER — Encounter: Payer: Self-pay | Admitting: Family Medicine

## 2021-09-28 ENCOUNTER — Ambulatory Visit (INDEPENDENT_AMBULATORY_CARE_PROVIDER_SITE_OTHER): Payer: Medicare Other | Admitting: Family Medicine

## 2021-09-28 VITALS — BP 153/102 | HR 84 | Temp 97.2°F | Ht 72.0 in | Wt 196.8 lb

## 2021-09-28 DIAGNOSIS — I10 Essential (primary) hypertension: Secondary | ICD-10-CM

## 2021-09-28 DIAGNOSIS — M170 Bilateral primary osteoarthritis of knee: Secondary | ICD-10-CM | POA: Diagnosis not present

## 2021-09-28 DIAGNOSIS — R972 Elevated prostate specific antigen [PSA]: Secondary | ICD-10-CM

## 2021-09-28 DIAGNOSIS — M7122 Synovial cyst of popliteal space [Baker], left knee: Secondary | ICD-10-CM | POA: Diagnosis not present

## 2021-09-28 DIAGNOSIS — E782 Mixed hyperlipidemia: Secondary | ICD-10-CM | POA: Diagnosis not present

## 2021-09-28 NOTE — Patient Instructions (Signed)
Baker Cyst °A Baker cyst, also called a popliteal cyst, is a growth that forms at the back of the knee. The cyst forms when the fluid-filled sac (bursa) that cushions the knee joint becomes enlarged. °What are the causes? °In most cases, a Baker cyst results from another knee problem that causes swelling inside the knee. This makes the fluid inside the knee joint (synovial fluid) flow into the bursa behind the knee, causing the bursa to enlarge. °What increases the risk? °You may be more likely to develop a Baker cyst if you already have a knee problem, such as: °A tear in cartilage that cushions the knee joint (meniscal tear). °A tear in the tissues that connect the bones of the knee joint (ligament tear). °Knee swelling from osteoarthritis, rheumatoid arthritis, or gout. °What are the signs or symptoms? °The main symptom of this condition is a lump behind the knee. This may be the only symptom of the condition. The lump may be painful, especially when the knee is straightened. If the lump is painful, the pain may come and go. The knee may also be stiff. °Symptoms may quickly get more severe if the cyst breaks open (ruptures). If the cyst ruptures, you may feel the following in your knee and calf: °Sudden or worsening pain. °Swelling. °Bruising. °Redness in the calf. °A Baker cyst does not always cause symptoms. °How is this diagnosed? °This condition may be diagnosed based on your symptoms and medical history. Your health care provider will also do a physical exam. This may include: °Feeling the cyst to check whether it is tender. °Checking your knee for signs of another knee condition that causes swelling. °You may have imaging tests, such as: °X-rays. °MRI. °Ultrasound. °How is this treated? °A Baker cyst that is not painful may go away without treatment. If the cyst gets large or painful, it will likely get better if the underlying knee problem is treated. °If needed, treatment for a Baker cyst may  include: °Resting. °Keeping weight off of the knee. This means not leaning on the knee to support your body weight. °Taking NSAIDs, such as ibuprofen, to reduce pain and swelling. °Having a procedure to drain the fluid from the cyst with a needle (aspiration). You may also get an injection of a medicine that reduces swelling (steroid). °Having surgery. This may be needed if other treatments do not work. This usually involves correcting knee damage and removing the cyst. °Follow these instructions at home: °Activity °Rest as told by your health care provider. °Avoid activities that make pain or swelling worse. °Return to your normal activities as told by your health care provider. Ask your health care provider what activities are safe for you. °Do not use the injured limb to support your body weight until your health care provider says that you can. Use crutches as told by your health care provider. °General instructions °Take over-the-counter and prescription medicines only as told by your health care provider. °Keep all follow-up visits as told by your health care provider. This is important. °Contact a health care provider if: °You have knee pain, stiffness, or swelling that does not get better. °Get help right away if: °You have sudden or worsening pain and swelling in your calf area. °Summary °A Baker cyst, also called a popliteal cyst, is a growth that forms at the back of the knee. °In most cases, a Baker cyst results from another knee problem that causes swelling inside the knee. °A Baker cyst that is not painful   may go away without treatment. °If needed, treatment for a Baker cyst may include resting, keeping weight off of the knee, medicines, or draining fluid from the cyst. °Surgery may be needed if other treatments are not effective. °This information is not intended to replace advice given to you by your health care provider. Make sure you discuss any questions you have with your health care  provider. °Document Revised: 12/21/2018 Document Reviewed: 12/21/2018 °Elsevier Patient Education © 2022 [employer] ° °

## 2021-09-28 NOTE — Progress Notes (Signed)
Subjective: CC: knee issue PCP: [name], DO [facility] Ralph Stevens is a 71 y.o. male presenting to clinic today for:  1.  Knee left Patient reports that he has been having some swelling on the backside of his left knee for a while now.  He does not report any pain.  No injury.  He has chronic osteoarthritis of bilateral knees but they do not usually bother him unless he is standing for a long time and that is his baseline.  He feels like it is simply a pressure and tightness in the posterior left knee and wanted to make sure that this was nothing to be concerned about.   ROS: Per HPI  Allergies  Allergen Reactions   Gabapentin Hives   Past Medical History:  Diagnosis Date   Acid reflux    Arthritis    Arthritis    Cataract    Mixed form OU   Enlarged prostate    PSA goes up and down    GERD (gastroesophageal reflux disease)    High cholesterol    Hyperlipidemia    Hypertension    Hypertensive retinopathy    OU    Current Outpatient Medications:    acetaminophen (TYLENOL) 500 MG tablet, Take 1,000 mg by mouth every 6 (six) hours as needed for moderate pain or headache., Disp: , Rfl:    alfuzosin (UROXATRAL) 10 MG 24 hr tablet, Take 1 tablet (10 mg total) by mouth every evening., Disp: 90 tablet, Rfl: 3   amLODipine (NORVASC) 5 MG tablet, Take 1 tablet (5 mg total) by mouth daily., Disp: 90 tablet, Rfl: 3   aspirin 81 MG chewable tablet, Chew 81 mg by mouth in the morning. , Disp: , Rfl:    atorvastatin (LIPITOR) 20 MG tablet, Take 1 tablet (20 mg total) by mouth daily. (Patient taking differently: Take 20 mg by mouth every evening.), Disp: 90 tablet, Rfl: 3   esomeprazole (NEXIUM) 20 MG capsule, Take 20 mg by mouth in the morning. , Disp: , Rfl:    ibuprofen (ADVIL) 200 MG tablet, Take 600 mg by mouth every 6 (six) hours as needed for headache or moderate pain., Disp: , Rfl:    lisinopril (ZESTRIL) 40 MG tablet, Take 1 tablet (40 mg total) by mouth daily.,  Disp: 90 tablet, Rfl: 3   metoprolol tartrate (LOPRESSOR) 50 MG tablet, Take 1 tablet (50 mg total) by mouth 2 (two) times daily., Disp: 180 tablet, Rfl: 3   fluticasone (FLONASE) 50 MCG/ACT nasal spray, Place 1-2 sprays into both nostrils daily as needed for allergies or rhinitis. 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  He currently is unemployed but is actively looking for work as he enjoys being busy.   He recently  relocated from [place] and resides locally with his wife.  He has a son that lives in [place] but his other 2 daughters are residing in [place].   Social Determinants of Health   Financial Resource Strain: Low Risk    Difficulty of Paying Living Expenses: Not very hard  Food Insecurity: No Food Insecurity   Worried About Charity fundraiser in the Last Year: Never true   Ran Out of Food in the Last Year: Never true  Transportation Needs: No Transportation Needs   Lack of Transportation (Medical): No   Lack of Transportation (Non-Medical): No  Physical Activity: Sufficiently Active   Days of Exercise per Week: 7 days   Minutes of Exercise per Session: 30 min  Stress: No Stress Concern Present   Feeling of Stress : Not at all  Social Connections: Moderately Isolated   Frequency of Communication with Friends and Family: More than three times a week   Frequency of  Social Gatherings with Friends and Family: More than three times a week   Attends Religious Services: Never   Marine scientist or Organizations: No   Attends Music therapist: Never   Marital Status: Married  Human resources officer Violence: Not At Risk   Fear of Current or Ex-Partner: No   Emotionally Abused: No   Physically Abused: No   Sexually Abused: No   Family History  Problem Relation Age of Onset   Cancer Mother    Colon cancer Mother    Dementia Mother    Alzheimer's disease Mother    Emphysema Father    Hypothyroidism Sister    Thyroid disease Sister    Hypertension Sister    Multiple sclerosis Daughter    Heart defect Daughter    Colon cancer Maternal Grandmother    Heart disease Maternal Grandfather    Heart disease Paternal Grandmother    Cancer Paternal Grandfather        unknown ( mouth/throat)   Colon cancer Maternal Uncle     Objective: Office vital signs reviewed. BP (!) 162/94    Pulse 84    Temp (!) 97.2 F (36.2 C)    Ht 6' (1.829 m)    Wt 196 lb 12.8 oz (89.3 kg)    SpO2 96%    BMI 26.69 kg/m   Physical Examination:  General: Awake, alert, well nourished, No acute distress MSK: Ambulating independently with normal gait  Left knee: No gross joint swelling, soft tissue swelling or erythema.  No warmth.  He has a palpable fullness in the posterior left knee consistent with a Baker's cyst.  Assessment/ Plan: 71 y.o. male   Baker cyst, left  Primary osteoarthritis of both knees  Essential hypertension - Plan: CMP14+EGFR  Mixed hyperlipidemia - Plan: CMP14+EGFR, Lipid panel  Elevated PSA - Plan: PSA, CBC  Desires drainage of the Baker's cyst.  Referral has been placed for orthopedics here in [place] to take care of this in March for the patient.  He understands to follow-up sooner if any concerns or pain arises  Blood pressure is not controlled but he does have whitecoat hypertension.  We will recheck this again at his next visit  in a couple of months.  Continue taking current medications as prescribed.  Future orders for fasting labs and recheck of elevated PSA placed  No orders of the defined types were placed in this encounter.  No orders of the defined types were placed in this encounter.    Farah Benish Jerilynn Mages  [place], [place] [phone]

## 2021-10-18 ENCOUNTER — Encounter: Payer: Self-pay | Admitting: Orthopedic Surgery

## 2021-10-18 ENCOUNTER — Ambulatory Visit: Payer: Medicare Other | Admitting: Orthopedic Surgery

## 2021-10-18 ENCOUNTER — Ambulatory Visit (INDEPENDENT_AMBULATORY_CARE_PROVIDER_SITE_OTHER): Payer: Medicare Other

## 2021-10-18 DIAGNOSIS — M7122 Synovial cyst of popliteal space [Baker], left knee: Secondary | ICD-10-CM | POA: Diagnosis not present

## 2021-10-18 DIAGNOSIS — G8929 Other chronic pain: Secondary | ICD-10-CM

## 2021-10-18 NOTE — Patient Instructions (Addendum)
Call if your knee pain worsens  Can consider a steroid injection in the future.     Knee Exercises  Ask your health care provider which exercises are safe for you. Do exercises exactly as told by your health care provider and adjust them as directed. It is normal to feel mild stretching, pulling, tightness, or discomfort as you do these exercises. Stop right away if you feel sudden pain or your pain gets worse. Do not begin these exercises until told by your health care provider.  Stretching and range-of-motion exercises These exercises warm up your muscles and joints and improve the movement and flexibility of your knee. These exercises also help to relieve pain and swelling.  Knee extension, prone Lie on your abdomen (prone position) on a bed. Place your left / right knee just beyond the edge of the surface so your knee is not on the bed. You can put a towel under your left / right thigh just above your kneecap for comfort. Relax your leg muscles and allow gravity to straighten your knee (extension). You should feel a stretch behind your left / right knee. Hold this position for 10 seconds. Scoot up so your knee is supported between repetitions. Repeat 10 times. Complete this exercise 3-4 times per week.     Knee flexion, active Lie on your back with both legs straight. If this causes back discomfort, bend your left / right knee so your foot is flat on the floor. Slowly slide your left / right heel back toward your buttocks. Stop when you feel a gentle stretch in the front of your knee or thigh (flexion). Hold this position for 10 seconds. Slowly slide your left / right heel back to the starting position. Repeat 10 times. Complete this exercise 3-4 times per week.      Quadriceps stretch, prone Lie on your abdomen on a firm surface, such as a bed or padded floor. Bend your left / right knee and hold your ankle. If you cannot reach your ankle or pant leg, loop a belt around your foot  and grab the belt instead. Gently pull your heel toward your buttocks. Your knee should not slide out to the side. You should feel a stretch in the front of your thigh and knee (quadriceps). Hold this position for 10 seconds. Repeat 10 times. Complete this exercise 3-4 times per week.      Hamstring, supine Lie on your back (supine position). Loop a belt or towel over the ball of your left / right foot. The ball of your foot is on the walking surface, right under your toes. Straighten your left / right knee and slowly pull on the belt to raise your leg until you feel a gentle stretch behind your knee (hamstring). Do not let your knee bend while you do this. Keep your other leg flat on the floor. Hold this position for 10 seconds. Repeat 10 times. Complete this exercise 3-4 times per week.   Strengthening exercises These exercises build strength and endurance in your knee. Endurance is the ability to use your muscles for a long time, even after they get tired.  Quadriceps, isometric This exercise stretches the muscles in front of your thigh (quadriceps) without moving your knee joint (isometric). Lie on your back with your left / right leg extended and your other knee bent. Put a rolled towel or small pillow under your knee if told by your health care provider. Slowly tense the muscles in the front of  your left / right thigh. You should see your kneecap slide up toward your hip or see increased dimpling just above the knee. This motion will push the back of the knee toward the floor. For 10 seconds, hold the muscle as tight as you can without increasing your pain. Relax the muscles slowly and completely. Repeat 10 times. Complete this exercise 3-4 times per week. .     Straight leg raises This exercise stretches the muscles in front of your thigh (quadriceps) and the muscles that move your hips (hip flexors). Lie on your back with your left / right leg extended and your other knee  bent. Tense the muscles in the front of your left / right thigh. You should see your kneecap slide up or see increased dimpling just above the knee. Your thigh may even shake a bit. Keep these muscles tight as you raise your leg 4-6 inches (10-15 cm) off the floor. Do not let your knee bend. Hold this position for 10 seconds. Keep these muscles tense as you lower your leg. Relax your muscles slowly and completely after each repetition. Repeat 10 times. Complete this exercise 3-4 times per week.  Hamstring, isometric Lie on your back on a firm surface. Bend your left / right knee about 30 degrees. Dig your left / right heel into the surface as if you are trying to pull it toward your buttocks. Tighten the muscles in the back of your thighs (hamstring) to "dig" as hard as you can without increasing any pain. Hold this position for 10 seconds. Release the tension gradually and allow your muscles to relax completely for __________ seconds after each repetition. Repeat 10 times. Complete this exercise 3-4 times per week.  Hamstring curls If told by your health care provider, do this exercise while wearing ankle weights. Begin with 5 lb weights. Then increase the weight by 1 lb (0.5 kg) increments. You can also use an exercise band Lie on your abdomen with your legs straight. Bend your left / right knee as far as you can without feeling pain. Keep your hips flat against the floor. Hold this position for 10 seconds. Slowly lower your leg to the starting position. Repeat 10 times. Complete this exercise 3-4 times per week.      Squats This exercise strengthens the muscles in front of your thigh and knee (quadriceps). Stand in front of a table, with your feet and knees pointing straight ahead. You may rest your hands on the table for balance but not for support. Slowly bend your knees and lower your hips like you are going to sit in a chair. Keep your weight over your heels, not over your  toes. Keep your lower legs upright so they are parallel with the table legs. Do not let your hips go lower than your knees. Do not bend lower than told by your health care provider. If your knee pain increases, do not bend as low. Hold the squat position for 10 seconds. Slowly push with your legs to return to standing. Do not use your hands to pull yourself to standing. Repeat 10 times. Complete this exercise 3-4 times per week .     Wall slides This exercise strengthens the muscles in front of your thigh and knee (quadriceps). Lean your back against a smooth wall or door, and walk your feet out 18-24 inches (46-61 cm) from it. Place your feet hip-width apart. Slowly slide down the wall or door until your knees bend 90 degrees.  Keep your knees over your heels, not over your toes. Keep your knees in line with your hips. Hold this position for 10 seconds. Repeat 10 times. Complete this exercise 3-4 times per week.      Straight leg raises This exercise strengthens the muscles that rotate the leg at the hip and move it away from your body (hip abductors). Lie on your side with your left / right leg in the top position. Lie so your head, shoulder, knee, and hip line up. You may bend your bottom knee to help you keep your balance. Roll your hips slightly forward so your hips are stacked directly over each other and your left / right knee is facing forward. Leading with your heel, lift your top leg 4-6 inches (10-15 cm). You should feel the muscles in your outer hip lifting. Do not let your foot drift forward. Do not let your knee roll toward the ceiling. Hold this position for 10 seconds. Slowly return your leg to the starting position. Let your muscles relax completely after each repetition. Repeat 10 times. Complete this exercise 3-4 times per week.      Straight leg raises This exercise stretches the muscles that move your hips away from the front of the pelvis (hip extensors). Lie on  your abdomen on a firm surface. You can put a pillow under your hips if that is more comfortable. Tense the muscles in your buttocks and lift your left / right leg about 4-6 inches (10-15 cm). Keep your knee straight as you lift your leg. Hold this position for 10 seconds. Slowly lower your leg to the starting position. Let your leg relax completely after each repetition. Repeat 10 times. Complete this exercise 3-4 times per week.

## 2021-10-19 ENCOUNTER — Encounter: Payer: Self-pay | Admitting: Orthopedic Surgery

## 2021-10-19 NOTE — Progress Notes (Signed)
New Patient Visit  Assessment: Ralph Stevens is a 71 y.o. male with the following: 1. Baker's cyst of knee, left  Plan: Patient has pain in the left knee, which is not that severe currently.  He noticed the swelling in the posterior aspect of his left knee, and this was the biggest reason for him to present for evaluation.  He is not limited by his left knee currently.  We reviewed radiographs which demonstrates some mild to moderate degenerative changes, most advanced within the patellofemoral joint.  There is crepitus with range of motion testing.  We discussed multiple treatment options including over-the-counter medications, topical treatments consideration for an injection or consideration for aspiration of the Baker's cyst, with injection.  At this point, he will continue with medications as needed.  Follow-up as needed.   Follow-up: Return if symptoms worsen or fail to improve.  Subjective:  Chief Complaint  Patient presents with   Knee Problem    Posterior swelling, Baker's cyst.  No real pain in knee.  No mechanical symptoms.     History of Present Illness: Ralph Stevens is a 71 y.o. male who has been referred to clinic today by Ronnie Doss, DO for evaluation of left knee pain.  He has had occasional pain in the left knee for a while.  More recently, he has noticed some swelling in the posterior aspect of the knee.  It is not painful.  No bruising.  He does not have any catching or locking sensations.  He does note some popping and cracking with range of motion.  He is not taking medications on a regular basis.  No physical therapy.  No prior injections.   Review of Systems: No fevers or chills No numbness or tingling No chest pain No shortness of breath No bowel or bladder dysfunction No GI distress No headaches   Medical History:  Past Medical History:  Diagnosis Date   Acid reflux    Arthritis    Arthritis    Cataract    Mixed form OU    Enlarged prostate    PSA goes up and down    GERD (gastroesophageal reflux disease)    High cholesterol    Hyperlipidemia    Hypertension    Hypertensive retinopathy    OU    Past Surgical History:  Procedure Laterality Date   colonoscopy  2019   internal hemorrhoids and pancolonic diverticulosis. Surveillance due in 2024 due to history of adenomatous colon polyps and family history.   COLONOSCOPY N/A 06/23/2021   Procedure: COLONOSCOPY;  Surgeon: Daneil Dolin, MD;  Location: [facility];  Service: Endoscopy;  Laterality: N/A;  10:30am   EYE SURGERY     TONSILLECTOMY     age 60     Family History  Problem Relation Age of Onset   Cancer Mother    Colon cancer Mother    Dementia Mother    Alzheimer's disease Mother    Emphysema Father    Hypothyroidism Sister    Thyroid disease Sister    Hypertension Sister    Multiple sclerosis Daughter    Heart defect Daughter    Colon cancer Maternal Grandmother    Heart disease Maternal Grandfather    Heart disease Paternal Grandmother    Cancer Paternal Grandfather        unknown ( mouth/throat)   Colon cancer Maternal Uncle    Social History   Tobacco Use   Smoking status: Never   Smokeless tobacco:  Never  Vaping Use   Vaping Use: Never used  Substance Use Topics   Alcohol use: Never   Drug use: Never    Allergies  Allergen Reactions   Gabapentin Hives    Current Meds  Medication Sig   acetaminophen (TYLENOL) 500 MG tablet Take 1,000 mg by mouth every 6 (six) hours as needed for moderate pain or headache.   alfuzosin (UROXATRAL) 10 MG 24 hr tablet Take 1 tablet (10 mg total) by mouth every evening.   amLODipine (NORVASC) 5 MG tablet Take 1 tablet (5 mg total) by mouth daily.   aspirin 81 MG chewable tablet Chew 81 mg by mouth in the morning.    atorvastatin (LIPITOR) 20 MG tablet Take 1 tablet (20 mg total) by mouth daily. (Patient taking differently: Take 20 mg by mouth every evening.)   fluticasone (FLONASE)  50 MCG/ACT nasal spray Place 1-2 sprays into both nostrils daily as needed for allergies or rhinitis.   ibuprofen (ADVIL) 200 MG tablet Take 600 mg by mouth every 6 (six) hours as needed for headache or moderate pain.   lisinopril (ZESTRIL) 40 MG tablet Take 1 tablet (40 mg total) by mouth daily.   metoprolol tartrate (LOPRESSOR) 50 MG tablet Take 1 tablet (50 mg total) by mouth 2 (two) times daily.    Objective: There were no vitals taken for this visit.  Physical Exam:  General: Alert and oriented. Gait: Normal gait.  Evaluation left knee demonstrates no effusion.  There is a swelling of the posterior aspect of the knee.  This is nontender to palpation.  Range of motion from 0-120 degrees.  Crepitus in the patellofemoral joint.  No tenderness to palpation along the medial or lateral joint lines.  Negative Lachman.  IMAGING: I personally ordered and reviewed the following images  X-ray of the left knee was obtained in clinic today.  Neutral overall alignment.  No acute injuries are noted.  Mild loss of joint space within the medial compartment.  Moderate loss of joint space within the patellofemoral compartment, there are some small well-corticated fragments in the lateral aspect the patellofemoral joint.  Impression: Mild to moderate left knee arthritis most advanced within the patellofemoral compartment.   New Medications:  No orders of the defined types were placed in this encounter.     Mordecai Rasmussen, MD  10/19/2021 12:39 AM

## 2021-10-26 ENCOUNTER — Other Ambulatory Visit: Payer: Self-pay

## 2021-10-26 ENCOUNTER — Ambulatory Visit: Payer: Medicare Other | Admitting: Physician Assistant

## 2021-10-26 VITALS — BP 163/92 | HR 94 | Ht 72.0 in | Wt 195.0 lb

## 2021-10-26 DIAGNOSIS — N401 Enlarged prostate with lower urinary tract symptoms: Secondary | ICD-10-CM | POA: Diagnosis not present

## 2021-10-26 DIAGNOSIS — R350 Frequency of micturition: Secondary | ICD-10-CM

## 2021-10-26 DIAGNOSIS — K5900 Constipation, unspecified: Secondary | ICD-10-CM

## 2021-10-26 LAB — URINALYSIS, ROUTINE W REFLEX MICROSCOPIC
Bilirubin, UA: NEGATIVE
Glucose, UA: NEGATIVE
Ketones, UA: NEGATIVE
Leukocytes,UA: NEGATIVE
Nitrite, UA: NEGATIVE
Protein,UA: NEGATIVE
Specific Gravity, UA: 1.02 (ref 1.005–1.030)
Urobilinogen, Ur: 0.2 mg/dL (ref 0.2–1.0)
pH, UA: 6.5 (ref 5.0–7.5)

## 2021-10-26 LAB — MICROSCOPIC EXAMINATION
Bacteria, UA: NONE SEEN
Epithelial Cells (non renal): NONE SEEN /hpf (ref 0–10)
RBC, Urine: NONE SEEN /hpf (ref 0–2)
Renal Epithel, UA: NONE SEEN /hpf
WBC, UA: NONE SEEN /hpf (ref 0–5)

## 2021-10-26 LAB — BLADDER SCAN AMB NON-IMAGING: Scan Result: 62

## 2021-10-26 NOTE — Progress Notes (Signed)
? ?Assessment: ?1. Urinary frequency   ?2. Benign prostatic hyperplasia with urinary frequency   ?3. Constipation, unspecified constipation type   ? ? ?Plan: ?Pt reassured that urine is clear of infection and he has no evidence of retention. Pt will resume use of Miralax and if sxs persist or worsen/change, he will FU for further evaluation.  ? ?Chief Complaint: ?No chief complaint on file. ? ? ?HPI: ?10/26/21 ?Ralph Stevens is a 71 y.o. male who presents for evaluation of acute onset urinary frequency, intermittency, and weakness of stream x 2 weeks. No dysuria, burning, hematuria, incontience, urgency. No fever, chills. Pt denies recent med changes, but did take a Benedryl prior to onset of sxs 2 weeks ago. Pt has h/o constipation and has done Miralax in the past. Admits to constipation worsening over past 2 weeks. Pt consumes one caffeinated beverage daily. Pt continues alfuzosin daily. ? ?Urine clear ?PVR=58m ?IPSS= 12, QOL=3 ? ?2.2.2021: Here today referred by Dr GLajuana Ripplefor follow-up on his elevated PSA and 'abnormally shaped' prostate w/ mild LUTS. He is currently on tamsulosin -- previously on dual medical therapy w/ alfuzosin but stopped because he was having issues with it in combination w/ HCTZ (peeing too frequently). His PSA has apparently fluctuated over the last few years (at one point he thinks it was up to 6, but it came back down to what he says was normal for him), but he does not have any old records for these data.  ?  ?6.8.2021: Here for follow-up after catheter removal 2 wks prior. He has been on alfuzosin 2x daily since last visit and feels as though his urinary pattern and sx's are significantly improved. He still thinks his stream is weaker than before his recently retentive episode but feels as though this is still improving. ?He does have some dizziness. ?  ?12.7.2021: Pt here after septic bacterial infection. He notes that he has lingering fatigue but is otherwise recovering  well. He continues on alfuzosin but notes significant urinary frequency during the day. He notes a strong FOS with a full bladder but notes that the force diminishes while emptying his bladder. He reports that he is able to suppress urinary urge without issue and denies any recent foul-smelling urine or dysuria. ?  ?PSA: 3.0 (10.7.2021) ?  ?12.6.2022: He is still on alfuzosin, once a day.  IPSS 10, quality-of-life score 2. ?He asked for refill of his alfuzosin as well as prostaglandin e- 1 ? ?No recent PSA  ?Portions of the above documentation were copied from a prior visit for review purposes only. ? ?Allergies: ?Allergies  ?Allergen Reactions  ? Gabapentin Hives  ? ? ?PMH: ?Past Medical History:  ?Diagnosis Date  ? Acid reflux   ? Arthritis   ? Arthritis   ? Cataract   ? Mixed form OU  ? Enlarged prostate   ? PSA goes up and down   ? GERD (gastroesophageal reflux disease)   ? High cholesterol   ? Hyperlipidemia   ? Hypertension   ? Hypertensive retinopathy   ? OU  ? ? ?PSH: ?Past Surgical History:  ?Procedure Laterality Date  ? colonoscopy  2019  ? internal hemorrhoids and pancolonic diverticulosis. Surveillance due in 2024 due to history of adenomatous colon polyps and family history.  ? COLONOSCOPY N/A 06/23/2021  ? Procedure: COLONOSCOPY;  Surgeon: RDaneil Dolin MD;  Location: [facility];  Service: Endoscopy;  Laterality: N/A;  10:30am  ? EYE SURGERY    ? TONSILLECTOMY    ?  age 77   ? ? ?SH: ?Social History  ? ?Tobacco Use  ? Smoking status: Never  ? Smokeless tobacco: Never  ?Vaping Use  ? Vaping Use: Never used  ?Substance Use Topics  ? Alcohol use: Never  ? Drug use: Never  ? ? ?ROS: ?Constitutional:  Negative for fever, chills, weight loss ?CV: Negative for chest pain, previous MI, hypertension ?Respiratory:  Negative for shortness of breath, wheezing, sleep apnea, frequent cough ?GI:  Negative for nausea, vomiting, bloody stool, GERD ? ?PE: ?BP (!) 163/92   Pulse 94   Ht 6' (1.829 m)   Wt 195 lb  (88.5 kg)   BMI 26.45 kg/m?  ?GENERAL APPEARANCE:  Well appearing, well developed, well nourished, NAD ?HEENT:  Atraumatic, normocephalic ?NECK:  Supple. Trachea midline ?ABDOMEN:  Soft, non-tender, no masses ?EXTREMITIES:  Moves all extremities well, without clubbing, cyanosis, or edema ?NEUROLOGIC:  Alert and oriented x 3, normal gait, CN II-XII grossly intact ?MENTAL STATUS:  appropriate ?BACK:  Non-tender to palpation, No CVAT ?SKIN:  Warm, dry, and intact ? ? ?Results: ?Laboratory Data: ?Lab Results  ?Component Value Date  ? WBC 7.8 02/18/2020  ? HGB 14.8 02/18/2020  ? HCT 42.5 02/18/2020  ? MCV 88 02/18/2020  ? PLT 336 02/18/2020  ? ? ?Lab Results  ?Component Value Date  ? CREATININE 1.10 12/02/2020  ? ? ?Lab Results  ?Component Value Date  ? PSA 2.5 09/24/2019  ? ? ?No results found for: TESTOSTERONE ? ?Lab Results  ?Component Value Date  ? HGBA1C 6.1 12/02/2020  ? ? ?Urinalysis ?   ?Component Value Date/Time  ? COLORURINE ORANGE (A) 01/01/2020 1754  ? APPEARANCEUR Clear 07/27/2021 1058  ? LABSPEC 1.020 01/01/2020 1754  ? PHURINE 5.0 01/01/2020 1754  ? GLUCOSEU Negative 07/27/2021 1058  ? HGBUR SMALL (A) 01/01/2020 1754  ? BILIRUBINUR Negative 07/27/2021 1058  ? KETONESUR TRACE (A) 01/01/2020 1754  ? PROTEINUR 1+ (A) 07/27/2021 1058  ? PROTEINUR >300 (A) 01/01/2020 1754  ? UROBILINOGEN 0.2 01/28/2020 1028  ? NITRITE Negative 07/27/2021 1058  ? NITRITE POSITIVE (A) 01/01/2020 1754  ? LEUKOCYTESUR Negative 07/27/2021 1058  ? LEUKOCYTESUR LARGE (A) 01/01/2020 1754  ? ? ?Lab Results  ?Component Value Date  ? LABMICR See below: 07/27/2021  ? [place] None seen 07/27/2021  ? LABEPIT 0-10 07/27/2021  ? MUCUS Present 07/27/2021  ? BACTERIA None seen 07/27/2021  ? ? ?Pertinent Imaging: ? ?No results found for this or any previous visit. ? ?No results found for this or any previous visit. ? ?No results found for this or any previous visit. ? ?No results found for this or any previous visit. ? ?No results found for this  or any previous visit. ? ?No results found for this or any previous visit. ? ?No results found for this or any previous visit. ? ?No results found for this or any previous visit. ? ?Results for orders placed or performed in visit on 10/26/21 (from the past 24 hour(s))  ?BLADDER SCAN AMB NON-IMAGING  ? Collection Time: 10/26/21 10:42 AM  ?Result Value Ref Range  ? Scan Result 62   ?  ?

## 2021-10-26 NOTE — Progress Notes (Signed)
post void residual=62 mL ?

## 2021-11-01 ENCOUNTER — Ambulatory Visit: Payer: Medicare Other | Admitting: Orthopedic Surgery

## 2021-11-08 NOTE — Progress Notes (Signed)
? ? ? ? ? ?Gastroenterology Office Note   ? ? ?Primary Care Physician:  [name], DO  ?Primary Gastroenterologist: Dr. Gala Romney  ? ? ?Chief Complaint  ? ?Chief Complaint  ?Patient presents with  ? Hemorrhoids  ?  Banding today  ? ? ? ?History of Present Illness  ? ?Tandy Lewin is a 71 y.o. male presenting today in follow-up with a history of symptomatic hemorrhoids, undergoing banding of right posterior, left lateral, right anterior, and left lateral most recently. He underwent colonoscopy in interim from last visit (Nov 2022):  Hemorrhoids on perianal exam. 2 small 4 hemorrhoid tags, minimal internla hemorrhoids, scars appropriately located. Scattered medium-mouthed diverticula in entire colon. Family history of colon cancer in sister, mother, and several aunts/uncles.  ? ?Returns today without any bleeding since last visit. Occasional prolapse of redundant tissue but only once every few weeks. No abdominal pain. GERD controlled on Nexium. No dysphagia, N/V. No early satiety or weight loss. He reports an EGD several years ago in [place] without Barrett's.  ? ? ? ?Past Medical History:  ?Diagnosis Date  ? Acid reflux   ? Arthritis   ? Arthritis   ? Cataract   ? Mixed form OU  ? Enlarged prostate   ? PSA goes up and down   ? GERD (gastroesophageal reflux disease)   ? High cholesterol   ? Hyperlipidemia   ? Hypertension   ? Hypertensive retinopathy   ? OU  ? ? ?Past Surgical History:  ?Procedure Laterality Date  ? colonoscopy  2019  ? internal hemorrhoids and pancolonic diverticulosis. Surveillance due in 2024 due to history of adenomatous colon polyps and family history.  ? COLONOSCOPY N/A 06/23/2021  ? Hemorrhoids on perianal exam. 2 small 4 hemorrhoid tags, minimal internla hemorrhoids, scars appropriately located. Scattered medium-mouthed diverticula in entire colon.  ? EYE SURGERY    ? TONSILLECTOMY    ? age 56   ? ? ?Current Outpatient Medications  ?Medication Sig Dispense Refill  ?  acetaminophen (TYLENOL) 500 MG tablet Take 1,000 mg by mouth every 6 (six) hours as needed for moderate pain or headache.    ? alfuzosin (UROXATRAL) 10 MG 24 hr tablet Take 1 tablet (10 mg total) by mouth every evening. 90 tablet 3  ? amLODipine (NORVASC) 5 MG tablet Take 1 tablet (5 mg total) by mouth daily. 90 tablet 3  ? aspirin 81 MG chewable tablet Chew 81 mg by mouth in the morning.     ? fluticasone (FLONASE) 50 MCG/ACT nasal spray Place 1-2 sprays into both nostrils daily as needed for allergies or rhinitis. 48 mL 3  ? ibuprofen (ADVIL) 200 MG tablet Take 600 mg by mouth every 6 (six) hours as needed for headache or moderate pain.    ? lisinopril (ZESTRIL) 40 MG tablet Take 1 tablet (40 mg total) by mouth daily. 90 tablet 3  ? metoprolol tartrate (LOPRESSOR) 50 MG tablet Take 1 tablet (50 mg total) by mouth 2 (two) times daily. 180 tablet 3  ? ?No current facility-administered medications for this visit.  ? ? ?Allergies as of 11/09/2021 - Review Complete 11/09/2021  ?Allergen Reaction Noted  ? Gabapentin Hives 12/07/2020  ? ? ?Family History  ?Problem Relation Age of Onset  ? Cancer Mother   ? Colon cancer Mother   ? Dementia Mother   ? Alzheimer's disease Mother   ? Emphysema Father   ? Hypothyroidism Sister   ? Thyroid disease Sister   ? Hypertension  Sister   ? Multiple sclerosis Daughter   ? Heart defect Daughter   ? Colon cancer Maternal Grandmother   ? Heart disease Maternal Grandfather   ? Heart disease Paternal Grandmother   ? Cancer Paternal Grandfather   ?     unknown ( mouth/throat)  ? Colon cancer Maternal Uncle   ? ? ?Social History  ? ?Socioeconomic History  ? Marital status: Married  ?  Spouse name: [name]  ? Number of children: 3  ? Years of education: Not on file  ? Highest education level: Not on file  ?Occupational History  ?  Comment: semi-retired   ?Tobacco Use  ? Smoking status: Never  ? Smokeless tobacco: Never  ?Vaping Use  ? Vaping Use: Never used  ?Substance and Sexual Activity  ?  Alcohol use: Never  ? Drug use: Never  ? Sexual activity: Not on file  ?Other Topics Concern  ? Not on file  ?Social History Narrative  ? 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  He currently is unemployed but is actively looking for work as he enjoys being busy.  ? He recently relocated from [place] and resides locally with his wife.  He has a son that lives in [place] but his other 2 daughters are residing in [place].  ? ?Social Determinants of Health  ? ?Financial Resource Strain: Low Risk   ? Difficulty of Paying Living Expenses: Not very hard  ?Food Insecurity: No Food Insecurity  ? Worried About Charity fundraiser in the Last Year: Never true  ? Ran Out of Food in the Last Year: Never true  ?Transportation Needs: No Transportation Needs  ? Lack of Transportation (Medical): No  ? Lack of Transportation (Non-Medical): No  ?Physical Activity: Sufficiently Active  ? Days of Exercise per Week: 7 days  ? Minutes of Exercise per Session: 30 min  ?Stress: No Stress Concern Present  ? Feeling of Stress : Not at all  ?Social Connections: Moderately Isolated  ? Frequency of Communication with Friends and Family: More than three times a week  ? Frequency of Social Gatherings with Friends and Family: More than three times a week  ? Attends Religious Services: Never  ? Active Member of Clubs or Organizations: No  ? Attends Archivist Meetings: Never  ? Marital Status: Married  ?Intimate Partner Violence: Not At Risk  ? Fear of Current or Ex-Partner: No  ? Emotionally Abused: No  ? Physically Abused: No  ? Sexually Abused: No  ? ? ? ?Review of Systems  ? ?Gen: Denies any fever, chills, fatigue, weight loss, lack of appetite.  ?CV: Denies chest pain, heart palpitations, peripheral edema, syncope.  ?Resp: Denies shortness of breath at rest or with exertion. Denies wheezing or cough.  ?GI: see HPI ?GU : Denies urinary burning, urinary frequency, urinary hesitancy ?MS:  Denies joint pain, muscle weakness, cramps, or limitation of movement.  ?Derm: Denies rash, itching, dry skin ?Psych: Denies depression, anxiety, memory loss, and confusion ?Heme: Denies bruising, bleeding, and enlarged lymph nodes. ? ? ?Physical Exam  ? ?BP 130/78   Pulse 84   Temp (!) 97.5 ?F (36.4 ?C)   Ht 6' (1.829 m)   Wt 201 lb 12.8 oz (91.5 kg)   BMI 27.37 kg/m?  ?General:   Alert and oriented. Pleasant and cooperative. Well-nourished and well-developed.  ?Head:  Normocephalic and atraumatic. ?Eyes:  Without icterus ?Rectal:  external hemorrhoid  tags. DRE without mass. No prolapsing tissue ?Msk:  Symmetrical without gross deformities. Normal posture. ?Extremities:  Without edema. ?Neurologic:  Alert and  oriented x4;  grossly normal neurologically. ?Skin:  Intact without significant lesions or rashes. ?Psych:  Alert and cooperative. Normal mood and affect. ? ? ?Assessment  ? ?Garon Melander is a 71 y.o. male presenting today in follow-up with a history of symptomatic hemorrhoids s/p banding with overall good results, recently undergoing colonoscopy due to family history of colon cancer.  ? ?Doing well today without need for further banding. He does have occasional prolapse of tissue but no bleeding. We are holding off on any further banding right now unless recurrent bleeding or persistent prolapse.  ? ?GERD well managed with Nexium daily. Reports EGD at outside facility without Barrett's. Discussed signs/symptoms that would prompt need for evaluation.  ? ? ?PLAN  ?Return prn ?Colonoscopy in 5 years ?Call if any concerns ? ? ?Annitta Needs, PhD, ANP-BC ?[facility]  ? ? ?

## 2021-11-09 ENCOUNTER — Encounter: Payer: Self-pay | Admitting: Gastroenterology

## 2021-11-09 ENCOUNTER — Ambulatory Visit: Payer: Medicare Other | Admitting: Gastroenterology

## 2021-11-09 ENCOUNTER — Other Ambulatory Visit: Payer: Self-pay

## 2021-11-09 VITALS — BP 130/78 | HR 84 | Temp 97.5°F | Ht 72.0 in | Wt 201.8 lb

## 2021-11-09 DIAGNOSIS — K648 Other hemorrhoids: Secondary | ICD-10-CM | POA: Diagnosis not present

## 2021-11-09 DIAGNOSIS — K219 Gastro-esophageal reflux disease without esophagitis: Secondary | ICD-10-CM

## 2021-11-09 NOTE — Patient Instructions (Signed)
Please call me if any rectal bleeding, pressure, worsening prolapse of tissue. Avoid straining and constipation. ? ?Continue Nexium daily. If you have any problems swallowing, pain with eating, nausea, vomiting, feeling full early, weight loss, please call us! ? ?We will see you back as needed! ? ?I enjoyed seeing you again today! As you know, I value our relationship and want to provide genuine, compassionate, and quality care. I welcome your feedback. If you receive a survey regarding your visit,  I greatly appreciate you taking time to fill this out. See you next time! ? ?Annitta Needs, PhD, ANP-BC ?[facility]  ? ?

## 2021-11-17 NOTE — Progress Notes (Signed)
?[facility] Note ? ?11/18/2021 ? ?  ? ?CHIEF COMPLAINT ?Patient presents for Retina Follow Up ? ? ? ?HISTORY OF PRESENT ILLNESS: ?Ralph Stevens is a 71 y.o. male who presents to the clinic today for:  ?HPI   ? ? Retina Follow Up   ?Patient presents with  Other.  In left eye.  Duration of 6 months.  Since onset it is stable.  I, the attending physician,  performed the HPI with the patient and updated documentation appropriately. ? ?  ?  ? ? Comments   ?6 month follow up ERM OS- Doing well, no new problems.  ? ?  ?  ?Last edited by Bernarda Caffey, MD on 11/22/2021  1:56 AM.  ?  ? ?Pt states vision is stable, occasional floaters, has had first shingles vaccine ? ? ?Referring physician: ?[name], DO ?[address] [place],  [place] [address] ? ?HISTORICAL INFORMATION:  ? ?Selected notes from the [facility] ?Referred by PCP (Dr. Lajuana Ripple) for history of retinal tear OS  ? ?CURRENT MEDICATIONS: ?No current outpatient medications on file. (Ophthalmic Drugs)  ? ?No current facility-administered medications for this visit. (Ophthalmic Drugs)  ? ?Current Outpatient Medications (Other)  ?Medication Sig  ? acetaminophen (TYLENOL) 500 MG tablet Take 1,000 mg by mouth every 6 (six) hours as needed for moderate pain or headache.  ? alfuzosin (UROXATRAL) 10 MG 24 hr tablet Take 1 tablet (10 mg total) by mouth every evening.  ? amLODipine (NORVASC) 5 MG tablet Take 1 tablet (5 mg total) by mouth daily.  ? aspirin 81 MG chewable tablet Chew 81 mg by mouth in the morning.   ? fluticasone (FLONASE) 50 MCG/ACT nasal spray Place 1-2 sprays into both nostrils daily as needed for allergies or rhinitis.  ? ibuprofen (ADVIL) 200 MG tablet Take 600 mg by mouth every 6 (six) hours as needed for headache or moderate pain.  ? lisinopril (ZESTRIL) 40 MG tablet Take 1 tablet (40 mg total) by mouth daily.  ? metoprolol tartrate (LOPRESSOR) 50 MG tablet Take 1 tablet (50 mg total) by mouth 2 (two)  times daily.  ? ?No current facility-administered medications for this visit. (Other)  ? ?REVIEW OF SYSTEMS: ?ROS   ?Positive for: Gastrointestinal, Skin, Musculoskeletal, Eyes ?Negative for: Constitutional, Neurological, Genitourinary, HENT, Endocrine, Cardiovascular, Respiratory, Psychiatric, Allergic/Imm, Heme/Lymph ?Last edited by Leonie Douglas, COA on 11/18/2021 12:47 PM.  ?  ? ?ALLERGIES ?Allergies  ?Allergen Reactions  ? Gabapentin Hives  ? ?PAST MEDICAL HISTORY ?Past Medical History:  ?Diagnosis Date  ? Acid reflux   ? Arthritis   ? Arthritis   ? Cataract   ? Mixed form OU  ? Enlarged prostate   ? PSA goes up and down   ? GERD (gastroesophageal reflux disease)   ? High cholesterol   ? Hyperlipidemia   ? Hypertension   ? Hypertensive retinopathy   ? OU  ? ?Past Surgical History:  ?Procedure Laterality Date  ? colonoscopy  2019  ? internal hemorrhoids and pancolonic diverticulosis. Surveillance due in 2024 due to history of adenomatous colon polyps and family history.  ? COLONOSCOPY N/A 06/23/2021  ? Hemorrhoids on perianal exam. 2 small 4 hemorrhoid tags, minimal internla hemorrhoids, scars appropriately located. Scattered medium-mouthed diverticula in entire colon.  ? EYE SURGERY    ? TONSILLECTOMY    ? age 75   ? ?FAMILY HISTORY ?Family History  ?Problem Relation Age of Onset  ? Cancer Mother   ?  Colon cancer Mother   ? Dementia Mother   ? Alzheimer's disease Mother   ? Emphysema Father   ? Hypothyroidism Sister   ? Thyroid disease Sister   ? Hypertension Sister   ? Multiple sclerosis Daughter   ? Heart defect Daughter   ? Colon cancer Maternal Grandmother   ? Heart disease Maternal Grandfather   ? Heart disease Paternal Grandmother   ? Cancer Paternal Grandfather   ?     unknown ( mouth/throat)  ? Colon cancer Maternal Uncle   ? ?SOCIAL HISTORY ?Social History  ? ?Tobacco Use  ? Smoking status: Never  ? Smokeless tobacco: Never  ?Vaping Use  ? Vaping Use: Never used  ?Substance Use Topics  ? Alcohol use:  Never  ? Drug use: Never  ?  ? ?  ?OPHTHALMIC EXAM: ? ?Base Eye Exam   ? ? Visual Acuity (Snellen - Linear)   ? ?   Right Left  ? Dist cc 20/25- 20/20-  ? Dist ph cc 20/20   ? ? Correction: Glasses  ? ?  ?  ? ? Tonometry (Tonopen, 12:52 PM)   ? ?   Right Left  ? Pressure 16 15  ? ?  ?  ? ? Pupils   ? ?   Dark Light Shape React APD  ? Right 3 2 Round Brisk None  ? Left 3 2 Round Brisk None  ? ?  ?  ? ? Visual Fields (Counting fingers)   ? ?   Left Right  ?  Full Full  ? ?  ?  ? ? Extraocular Movement   ? ?   Right Left  ?  Full Full  ? ?  ?  ? ? Neuro/Psych   ? ? Oriented x3: Yes  ? Mood/Affect: Normal  ? ?  ?  ? ? Dilation   ? ? Both eyes: 1.0% Mydriacyl, 2.5% Phenylephrine @ 12:52 PM  ? ?  ?  ? ?  ? ?Slit Lamp and Fundus Exam   ? ? External Exam   ? ?   Right Left  ? External  vesicles on left forehead and scalp -- improving, negative Hutchinson's sign  ? ?  ?  ? ? Slit Lamp Exam   ? ?   Right Left  ? Lids/Lashes Dermatochalasis - upper lid, mild Meibomian gland dysfunction Dermatochalasis - upper lid, Mild Meibomian gland dysfunction, Telangiectasia  ? Conjunctiva/Sclera White and quiet White and quiet  ? Cornea Trace Punctate epithelial erosions, mild Arcus trace PEE  ? Anterior Chamber Deep,clear, narrow temporal angle Deep and clear, no cell or pigment  ? Iris Round and reactive Round and reactive  ? Lens 2-3+ Nuclear sclerosis, 2-3+ Cortical cataract 2-3+ Nuclear sclerosis, 2-3+ Cortical cataract  ? Anterior Vitreous Vitreous syneresis Vitreous syneresis, clear, no vitritis   ? ?  ?  ? ? Fundus Exam   ? ?   Right Left  ? Disc Pink and Sharp Pink and Sharp  ? C/D Ratio 0.4 0.3  ? Macula Flat, Good foveal reflex, Retinal pigment epithelial mottling, No heme or edema Flat, blunted foveal reflex, ERM with early striae, No heme  ? Vessels mild attenuation, mild tortuosity Mild Vascular attenuation, mild Tortuousity  ? Periphery Attached, No heme  Attached, operculated tear at 1200 with good laser surrounding, No  heme; no new RT/RD  ? ?  ?  ? ?  ? ?Refraction   ? ? Wearing Rx   ? ?  Sphere Cylinder Axis Add  ? Right -2.75 +3.75 177 +2.50  ? Left -1.25 +2.75 173 +2.50  ? ?  ?  ? ?  ? ? ?IMAGING AND PROCEDURES  ?Imaging and Procedures for '@TODAY'$ @ ? ?OCT, Retina - OU - Both Eyes   ? ?   ?Right Eye ?Quality was good. Central Foveal Thickness: 293. Progression has been stable. Findings include normal foveal contour, no IRF, no SRF.  ? ?Left Eye ?Quality was good. Central Foveal Thickness: 391. Progression has been stable. Findings include epiretinal membrane, no IRF, no SRF, macular pucker, abnormal foveal contour (Mild interval progression of ERM, irregular foveal contour).  ? ?Notes ?*Images captured and stored on drive ? ?Diagnosis / Impression:  ?OD: NFP, no IRF/SRF ?OS: +ERM w/ pucker -- Mild interval progression of ERM, irregular foveal contour ? ?Clinical management:  ?See below ? ?Abbreviations: NFP - Normal foveal profile. CME - cystoid macular edema. PED - pigment epithelial detachment. IRF - intraretinal fluid. SRF - subretinal fluid. EZ - ellipsoid zone. ERM - epiretinal membrane. ORA - outer retinal atrophy. ORT - outer retinal tubulation. SRHM - subretinal hyper-reflective material ? ? ?  ?  ?  ? ?  ?ASSESSMENT/PLAN: ? ?  ICD-10-CM   ?1. Epiretinal membrane (ERM) of left eye  H35.372 OCT, Retina - OU - Both Eyes  ?  ?2. History of repair of retinal tear by laser photocoagulation  Z98.890   ?  ?3. Essential hypertension  I10   ?  ?4. Hypertensive retinopathy of both eyes  H35.033   ?  ?5. Combined forms of age-related cataract of both eyes  H25.813   ?  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  ? ?1. Epiretinal membrane, OS ? - likely related to history of RT s/p laser retinopexy (see below) ? - mild ERM w/ early pucker -- slightly improved from prior on OCT ? - asymptomatic, no metamorphopsia ? - no indication for surgery at this time ? - monitor for now ? - f/u 6 mos, DFE, OCT ? ?2. History of retinal  tear OS ? - operculated tear located at 1200 ? - s/p laser retinopexy (Dr. Shana Chute, [facility] [place]. [place], [place]) -- good laser surrounding ? - stable  ? - no other RT/RD ? ?3,4. Hypertensive retin

## 2021-11-18 ENCOUNTER — Encounter (INDEPENDENT_AMBULATORY_CARE_PROVIDER_SITE_OTHER): Payer: Self-pay | Admitting: Ophthalmology

## 2021-11-18 ENCOUNTER — Ambulatory Visit (INDEPENDENT_AMBULATORY_CARE_PROVIDER_SITE_OTHER): Payer: Medicare Other | Admitting: Ophthalmology

## 2021-11-18 DIAGNOSIS — B0233 Zoster keratitis: Secondary | ICD-10-CM

## 2021-11-18 DIAGNOSIS — Z9889 Other specified postprocedural states: Secondary | ICD-10-CM

## 2021-11-18 DIAGNOSIS — H35372 Puckering of macula, left eye: Secondary | ICD-10-CM | POA: Diagnosis not present

## 2021-11-18 DIAGNOSIS — H25813 Combined forms of age-related cataract, bilateral: Secondary | ICD-10-CM

## 2021-11-18 DIAGNOSIS — H35033 Hypertensive retinopathy, bilateral: Secondary | ICD-10-CM

## 2021-11-18 DIAGNOSIS — I10 Essential (primary) hypertension: Secondary | ICD-10-CM | POA: Diagnosis not present

## 2021-11-19 ENCOUNTER — Encounter (INDEPENDENT_AMBULATORY_CARE_PROVIDER_SITE_OTHER): Payer: Medicare Other | Admitting: Ophthalmology

## 2021-11-22 ENCOUNTER — Encounter (INDEPENDENT_AMBULATORY_CARE_PROVIDER_SITE_OTHER): Payer: Self-pay | Admitting: Ophthalmology

## 2021-12-08 ENCOUNTER — Other Ambulatory Visit: Payer: Medicare Other

## 2021-12-08 DIAGNOSIS — E782 Mixed hyperlipidemia: Secondary | ICD-10-CM | POA: Diagnosis not present

## 2021-12-08 DIAGNOSIS — R972 Elevated prostate specific antigen [PSA]: Secondary | ICD-10-CM

## 2021-12-08 DIAGNOSIS — I10 Essential (primary) hypertension: Secondary | ICD-10-CM

## 2021-12-09 LAB — CMP14+EGFR
ALT: 16 IU/L (ref 0–44)
AST: 14 IU/L (ref 0–40)
Albumin/Globulin Ratio: 1.5 (ref 1.2–2.2)
Albumin: 4.1 g/dL (ref 3.8–4.8)
Alkaline Phosphatase: 78 IU/L (ref 44–121)
BUN/Creatinine Ratio: 10 (ref 10–24)
BUN: 11 mg/dL (ref 8–27)
Bilirubin Total: 0.8 mg/dL (ref 0.0–1.2)
CO2: 23 mmol/L (ref 20–29)
Calcium: 8.8 mg/dL (ref 8.6–10.2)
Chloride: 106 mmol/L (ref 96–106)
Creatinine, Ser: 1.06 mg/dL (ref 0.76–1.27)
Globulin, Total: 2.8 g/dL (ref 1.5–4.5)
Glucose: 110 mg/dL — ABNORMAL HIGH (ref 70–99)
Potassium: 3.7 mmol/L (ref 3.5–5.2)
Sodium: 144 mmol/L (ref 134–144)
Total Protein: 6.9 g/dL (ref 6.0–8.5)
eGFR: 75 mL/min/{1.73_m2} (ref >59)

## 2021-12-09 LAB — CBC
Hematocrit: 44.6 % (ref 37.5–51.0)
Hemoglobin: 14.8 g/dL (ref 13.0–17.7)
MCH: 29 pg (ref 26.6–33.0)
MCHC: 33.2 g/dL (ref 31.5–35.7)
MCV: 87 fL (ref 79–97)
Platelets: 276 10*3/uL (ref 150–450)
RBC: 5.11 x10E6/uL (ref 4.14–5.80)
RDW: 14.4 % (ref 11.6–15.4)
WBC: 7.8 10*3/uL (ref 3.4–10.8)

## 2021-12-09 LAB — LIPID PANEL
Chol/HDL Ratio: 5.9 ratio — ABNORMAL HIGH (ref 0.0–5.0)
Cholesterol, Total: 170 mg/dL (ref 100–199)
HDL: 29 mg/dL — ABNORMAL LOW (ref >39)
LDL Chol Calc (NIH): 108 mg/dL — ABNORMAL HIGH (ref 0–99)
Triglycerides: 190 mg/dL — ABNORMAL HIGH (ref 0–149)
VLDL Cholesterol Cal: 33 mg/dL (ref 5–40)

## 2021-12-09 LAB — PSA: Prostate Specific Ag, Serum: 2.5 ng/mL (ref 0.0–4.0)

## 2021-12-13 ENCOUNTER — Telehealth: Payer: Self-pay | Admitting: Family Medicine

## 2021-12-13 ENCOUNTER — Other Ambulatory Visit: Payer: Self-pay | Admitting: Family Medicine

## 2021-12-13 DIAGNOSIS — E782 Mixed hyperlipidemia: Secondary | ICD-10-CM

## 2021-12-13 MED ORDER — ROSUVASTATIN CALCIUM 10 MG PO TABS: 10.0000 mg | ORAL_TABLET | Freq: Every day | ORAL | 3 refills | Status: DC

## 2021-12-13 NOTE — Telephone Encounter (Signed)
Done

## 2021-12-13 NOTE — Telephone Encounter (Signed)
Patient aware.

## 2021-12-22 ENCOUNTER — Encounter: Payer: Self-pay | Admitting: Family Medicine

## 2021-12-22 ENCOUNTER — Ambulatory Visit (INDEPENDENT_AMBULATORY_CARE_PROVIDER_SITE_OTHER): Payer: Medicare Other | Admitting: Family Medicine

## 2021-12-22 ENCOUNTER — Telehealth: Payer: Self-pay | Admitting: Family Medicine

## 2021-12-22 VITALS — BP 127/83 | HR 77 | Temp 97.8°F | Ht 72.0 in | Wt 201.4 lb

## 2021-12-22 DIAGNOSIS — E782 Mixed hyperlipidemia: Secondary | ICD-10-CM

## 2021-12-22 DIAGNOSIS — R739 Hyperglycemia, unspecified: Secondary | ICD-10-CM | POA: Diagnosis not present

## 2021-12-22 DIAGNOSIS — Z0000 Encounter for general adult medical examination without abnormal findings: Secondary | ICD-10-CM

## 2021-12-22 DIAGNOSIS — Z23 Encounter for immunization: Secondary | ICD-10-CM

## 2021-12-22 DIAGNOSIS — R972 Elevated prostate specific antigen [PSA]: Secondary | ICD-10-CM

## 2021-12-22 DIAGNOSIS — Z0001 Encounter for general adult medical examination with abnormal findings: Secondary | ICD-10-CM | POA: Diagnosis not present

## 2021-12-22 DIAGNOSIS — I10 Essential (primary) hypertension: Secondary | ICD-10-CM

## 2021-12-22 NOTE — Patient Instructions (Signed)
Preventive Care 65 Years and Older, Male Preventive care refers to lifestyle choices and visits with your health care provider that can promote health and wellness. Preventive care visits are also called wellness exams. What can I expect for my preventive care visit? Counseling During your preventive care visit, your health care provider may ask about your: Medical history, including: Past medical problems. Family medical history. History of falls. Current health, including: Emotional well-being. Home life and relationship well-being. Sexual activity. Memory and ability to understand (cognition). Lifestyle, including: Alcohol, nicotine or tobacco, and drug use. Access to firearms. Diet, exercise, and sleep habits. Work and work environment. Sunscreen use. Safety issues such as seatbelt and bike helmet use. Physical exam Your health care provider will check your: Height and weight. These may be used to calculate your BMI (body mass index). BMI is a measurement that tells if you are at a healthy weight. Waist circumference. This measures the distance around your waistline. This measurement also tells if you are at a healthy weight and may help predict your risk of certain diseases, such as type 2 diabetes and high blood pressure. Heart rate and blood pressure. Body temperature. Skin for abnormal spots. What immunizations do I need?  Vaccines are usually given at various ages, according to a schedule. Your health care provider will recommend vaccines for you based on your age, medical history, and lifestyle or other factors, such as travel or where you work. What tests do I need? Screening Your health care provider may recommend screening tests for certain conditions. This may include: Lipid and cholesterol levels. Diabetes screening. This is done by checking your blood sugar (glucose) after you have not eaten for a while (fasting). Hepatitis C test. Hepatitis B test. HIV (human  immunodeficiency virus) test. STI (sexually transmitted infection) testing, if you are at risk. Lung cancer screening. Colorectal cancer screening. Prostate cancer screening. Abdominal aortic aneurysm (AAA) screening. You may need this if you are a current or former smoker. Talk with your health care provider about your test results, treatment options, and if necessary, the need for more tests. Follow these instructions at home: Eating and drinking  Eat a diet that includes fresh fruits and vegetables, whole grains, lean protein, and low-fat dairy products. Limit your intake of foods with high amounts of sugar, saturated fats, and salt. Take vitamin and mineral supplements as recommended by your health care provider. Do not drink alcohol if your health care provider tells you not to drink. If you drink alcohol: Limit how much you have to 0-2 drinks a day. Know how much alcohol is in your drink. In the [place] one drink equals one 12 oz bottle of beer (355 mL), one 5 oz glass of wine (148 mL), or one 1 oz glass of hard liquor (44 mL). Lifestyle Brush your teeth every morning and night with fluoride toothpaste. Floss one time each day. Exercise for at least 30 minutes 5 or more days each week. Do not use any products that contain nicotine or tobacco. These products include cigarettes, chewing tobacco, and vaping devices, such as e-cigarettes. If you need help quitting, ask your health care provider. Do not use drugs. If you are sexually active, practice safe sex. Use a condom or other form of protection to prevent STIs. Take aspirin only as told by your health care provider. Make sure that you understand how much to take and what form to take. Work with your health care provider to find out whether it is safe   and beneficial for you to take aspirin daily. Ask your health care provider if you need to take a cholesterol-lowering medicine (statin). Find healthy ways to manage stress, such  as: Meditation, yoga, or listening to music. Journaling. Talking to a trusted person. Spending time with friends and family. Safety Always wear your seat belt while driving or riding in a vehicle. Do not drive: If you have been drinking alcohol. Do not ride with someone who has been drinking. When you are tired or distracted. While texting. If you have been using any mind-altering substances or drugs. Wear a helmet and other protective equipment during sports activities. If you have firearms in your house, make sure you follow all gun safety procedures. Minimize exposure to UV radiation to reduce your risk of skin cancer. What's next? Visit your health care provider once a year for an annual wellness visit. Ask your health care provider how often you should have your eyes and teeth checked. Stay up to date on all vaccines. This information is not intended to replace advice given to you by your health care provider. Make sure you discuss any questions you have with your health care provider. Document Revised: 02/03/2021 Document Reviewed: 02/03/2021 Elsevier Patient Education  2023 Elsevier Inc.  

## 2021-12-22 NOTE — Progress Notes (Signed)
? ?Ralph Stevens is a 71 y.o. male presents to office today for annual physical exam examination.   ? ?Concerns today include: ?1.  Hypertension with hyperlipidemia ?Patient is compliant with lisinopril 40 mg daily, Lopressor 50 mg twice daily, Norvasc 5 mg daily and Crestor 10 mg daily.  Crestor was recently added and he notes that he is tolerating this medication without difficulty.  No reports of myalgia or joint pain.  No chest pain or shortness of breath.  No visual disturbance reported. ? ?Occupation: Retired, Marital status: Married  ?Diet: Trying to reduce carbohydrate intake but still consumes potatoes., Exercise: Stays active in the yard ?Last eye exam: Up-to-date ?Last colonoscopy: UTD ?Refills needed today: none ?Immunizations needed: Shingrix #2 ?Immunization History  ?Administered Date(s) Administered  ? Moderna Sars-Covid-2 Vaccination 10/30/2019, 11/27/2019, 08/18/2020  ? Zoster Recombinat (Shingrix) 09/06/2021  ? ? ? ?Past Medical History:  ?Diagnosis Date  ? Acid reflux   ? Arthritis   ? Arthritis   ? Cataract   ? Mixed form OU  ? Enlarged prostate   ? PSA goes up and down   ? GERD (gastroesophageal reflux disease)   ? High cholesterol   ? Hyperlipidemia   ? Hypertension   ? Hypertensive retinopathy   ? OU  ? ?Social History  ? ?Socioeconomic History  ? Marital status: Married  ?  Spouse name: Ralph Stevens  ? Number of children: 3  ? Years of education: Not on file  ? Highest education level: Not on file  ?Occupational History  ?  Comment: semi-retired   ?Tobacco Use  ? Smoking status: Never  ? Smokeless tobacco: Never  ?Vaping Use  ? Vaping Use: Never used  ?Substance and Sexual Activity  ? Alcohol use: Never  ? Drug use: Never  ? Sexual activity: Not on file  ?Other Topics Concern  ? Not on file  ?Social History Narrative  ? 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  He currently is unemployed but is actively looking for work as he enjoys being busy.   ? He recently relocated from [place] and resides locally with his wife.  He has a son that lives in [place] but his other 2 daughters are residing in [place].  ? ?Social Determinants of Health  ? ?Financial Resource Strain: Low Risk   ? Difficulty of Paying Living Expenses: Not very hard  ?Food Insecurity: No Food Insecurity  ? Worried About Charity fundraiser in the Last Year: Never true  ? Ran Out of Food in the Last Year: Never true  ?Transportation Needs: No Transportation Needs  ? Lack of Transportation (Medical): No  ? Lack of Transportation (Non-Medical): No  ?Physical Activity: Sufficiently Active  ? Days of Exercise per Week: 7 days  ? Minutes of Exercise per Session: 30 min  ?Stress: No Stress Concern Present  ? Feeling of Stress : Not at all  ?Social Connections: Moderately Isolated  ? Frequency of Communication with Friends and Family: More than three times a week  ? Frequency of Social Gatherings with Friends and Family: More than three times a week  ? Attends Religious Services: Never  ? Active Member of Clubs or Organizations: No  ? Attends Archivist Meetings: Never  ? Marital Status: Married  ?Intimate Partner Violence: Not At Risk  ? Fear of Current or Ex-Partner: No  ? Emotionally Abused: No  ? Physically Abused: No  ? Sexually Abused:  No  ? ?Past Surgical History:  ?Procedure Laterality Date  ? colonoscopy  2019  ? internal hemorrhoids and pancolonic diverticulosis. Surveillance due in 2024 due to history of adenomatous colon polyps and family history.  ? COLONOSCOPY N/A 06/23/2021  ? Hemorrhoids on perianal exam. 2 small 4 hemorrhoid tags, minimal internla hemorrhoids, scars appropriately located. Scattered medium-mouthed diverticula in entire colon.  ? EYE SURGERY    ? TONSILLECTOMY    ? age 31   ? ?Family History  ?Problem Relation Age of Onset  ? Cancer Mother   ? Colon cancer Mother   ? Dementia Mother   ? Alzheimer's disease Mother   ? Emphysema Father   ? Hypothyroidism Sister    ? Thyroid disease Sister   ? Hypertension Sister   ? Multiple sclerosis Daughter   ? Heart defect Daughter   ? Colon cancer Maternal Grandmother   ? Heart disease Maternal Grandfather   ? Heart disease Paternal Grandmother   ? Cancer Paternal Grandfather   ?     unknown ( mouth/throat)  ? Colon cancer Maternal Uncle   ? ? ?Current Outpatient Medications:  ?  acetaminophen (TYLENOL) 500 MG tablet, Take 1,000 mg by mouth every 6 (six) hours as needed for moderate pain or headache., Disp: , Rfl:  ?  alfuzosin (UROXATRAL) 10 MG 24 hr tablet, Take 1 tablet (10 mg total) by mouth every evening., Disp: 90 tablet, Rfl: 3 ?  amLODipine (NORVASC) 5 MG tablet, Take 1 tablet (5 mg total) by mouth daily., Disp: 90 tablet, Rfl: 3 ?  aspirin 81 MG chewable tablet, Chew 81 mg by mouth in the morning. , Disp: , Rfl:  ?  fluticasone (FLONASE) 50 MCG/ACT nasal spray, Place 1-2 sprays into both nostrils daily as needed for allergies or rhinitis., Disp: 48 mL, Rfl: 3 ?  ibuprofen (ADVIL) 200 MG tablet, Take 600 mg by mouth every 6 (six) hours as needed for headache or moderate pain., Disp: , Rfl:  ?  lisinopril (ZESTRIL) 40 MG tablet, Take 1 tablet (40 mg total) by mouth daily., Disp: 90 tablet, Rfl: 3 ?  metoprolol tartrate (LOPRESSOR) 50 MG tablet, Take 1 tablet (50 mg total) by mouth 2 (two) times daily., Disp: 180 tablet, Rfl: 3 ?  rosuvastatin (CRESTOR) 10 MG tablet, Take 1 tablet (10 mg total) by mouth daily., Disp: 90 tablet, Rfl: 3 ? ?Allergies  ?Allergen Reactions  ? Gabapentin Hives  ?  ? ?ROS: ?Review of Systems ?Pertinent items noted in HPI and remainder of comprehensive ROS otherwise negative.   ? ?Physical exam ?BP (!) 182/94   Pulse 77   Temp 97.8 ?F (36.6 ?C)   Ht 6' (1.829 m)   Wt 201 lb 6.4 oz (91.4 kg)   SpO2 97%   BMI 27.31 kg/m?  ?General appearance: alert, cooperative, appears stated age, and no distress ?Head: Normocephalic, without obvious abnormality, atraumatic ?Eyes: negative findings: lids and lashes  normal and conjunctivae and sclerae normal ?Ears: normal TM's and external ear canals both ears ?Nose: Nares normal. Septum midline. Mucosa normal. No drainage or sinus tenderness. ?Throat: lips, mucosa, and tongue normal; teeth and gums normal ?Neck: no adenopathy, no carotid bruit, supple, symmetrical, trachea midline, and thyroid not enlarged, symmetric, no tenderness/mass/nodules ?Back: symmetric, no curvature. ROM normal. No CVA tenderness. ?Lungs: Slight reduction in breath sounds but otherwise clear to auscultation bilaterally ?Heart: regular rate and rhythm, S1, S2 normal, no murmur, click, rub or gallop ?Abdomen:  Protuberant but soft, nontender.  No hepatosplenomegaly palpated ?Extremities: extremities normal, atraumatic, no cyanosis or edema ?Pulses: 2+ and symmetric ?Skin:  Tick on Shoulder. ?Lymph nodes: Cervical, supraclavicular, and axillary nodes normal. ?Neurologic: Grossly normal has impaired hearing and wears hearing aids.  Also wears corrective lenses ?Psych: Mood stable, speech normal, affect appropriate.  Patient very pleasant and interactive ? ? ?  12/22/2021  ?  8:45 AM 09/28/2021  ? 10:26 AM 09/22/2021  ? 10:36 AM  ?Depression screen PHQ 2/9  ?Decreased Interest 0 0 0  ?Down, Depressed, Hopeless 0 0 0  ?PHQ - 2 Score 0 0 0  ? ? ?  12/22/2021  ?  8:45 AM 09/28/2021  ? 10:26 AM 07/05/2021  ?  9:17 AM 03/03/2021  ?  8:51 AM  ?GAD 7 : Generalized Anxiety Score  ?Nervous, Anxious, on Edge 0 0 0 0  ?Control/stop worrying 0 0 0 0  ?Worry too much - different things 0 0 0 0  ?Trouble relaxing 0 0 0 0  ?Restless 0 0 0 0  ?Easily annoyed or irritable 0 0 0 0  ?Afraid - awful might happen 0 0 0 0  ?Total GAD 7 Score 0 0 0 0  ?Anxiety Difficulty Not difficult at all Not difficult at all Not difficult at all Not difficult at all  ? ? ?Assessment/ Plan: ?Ralph Stevens here for annual physical exam.  ? ?Annual physical exam ? ?Essential hypertension ? ?Elevated PSA ? ?Elevated serum glucose ? ?Mixed  hyperlipidemia ? ?BP not at goal during visit.  Home BP appropriate however.  Has history of white coat syndrome and had totally normal BP at recent specialist visit.  Continue to monitor. Ok to pop in for BP check w

## 2021-12-22 NOTE — Telephone Encounter (Signed)
Perfect. Thank you!

## 2021-12-25 IMAGING — DX DG CHEST 1V PORT
1 series · 1 of 1 positions shown · non-contrast
Comparison: None.

CLINICAL DATA: Urinary tract infection, sepsis, fever, tachycardia

EXAM:
PORTABLE CHEST 1 VIEW

[chest ap grid]
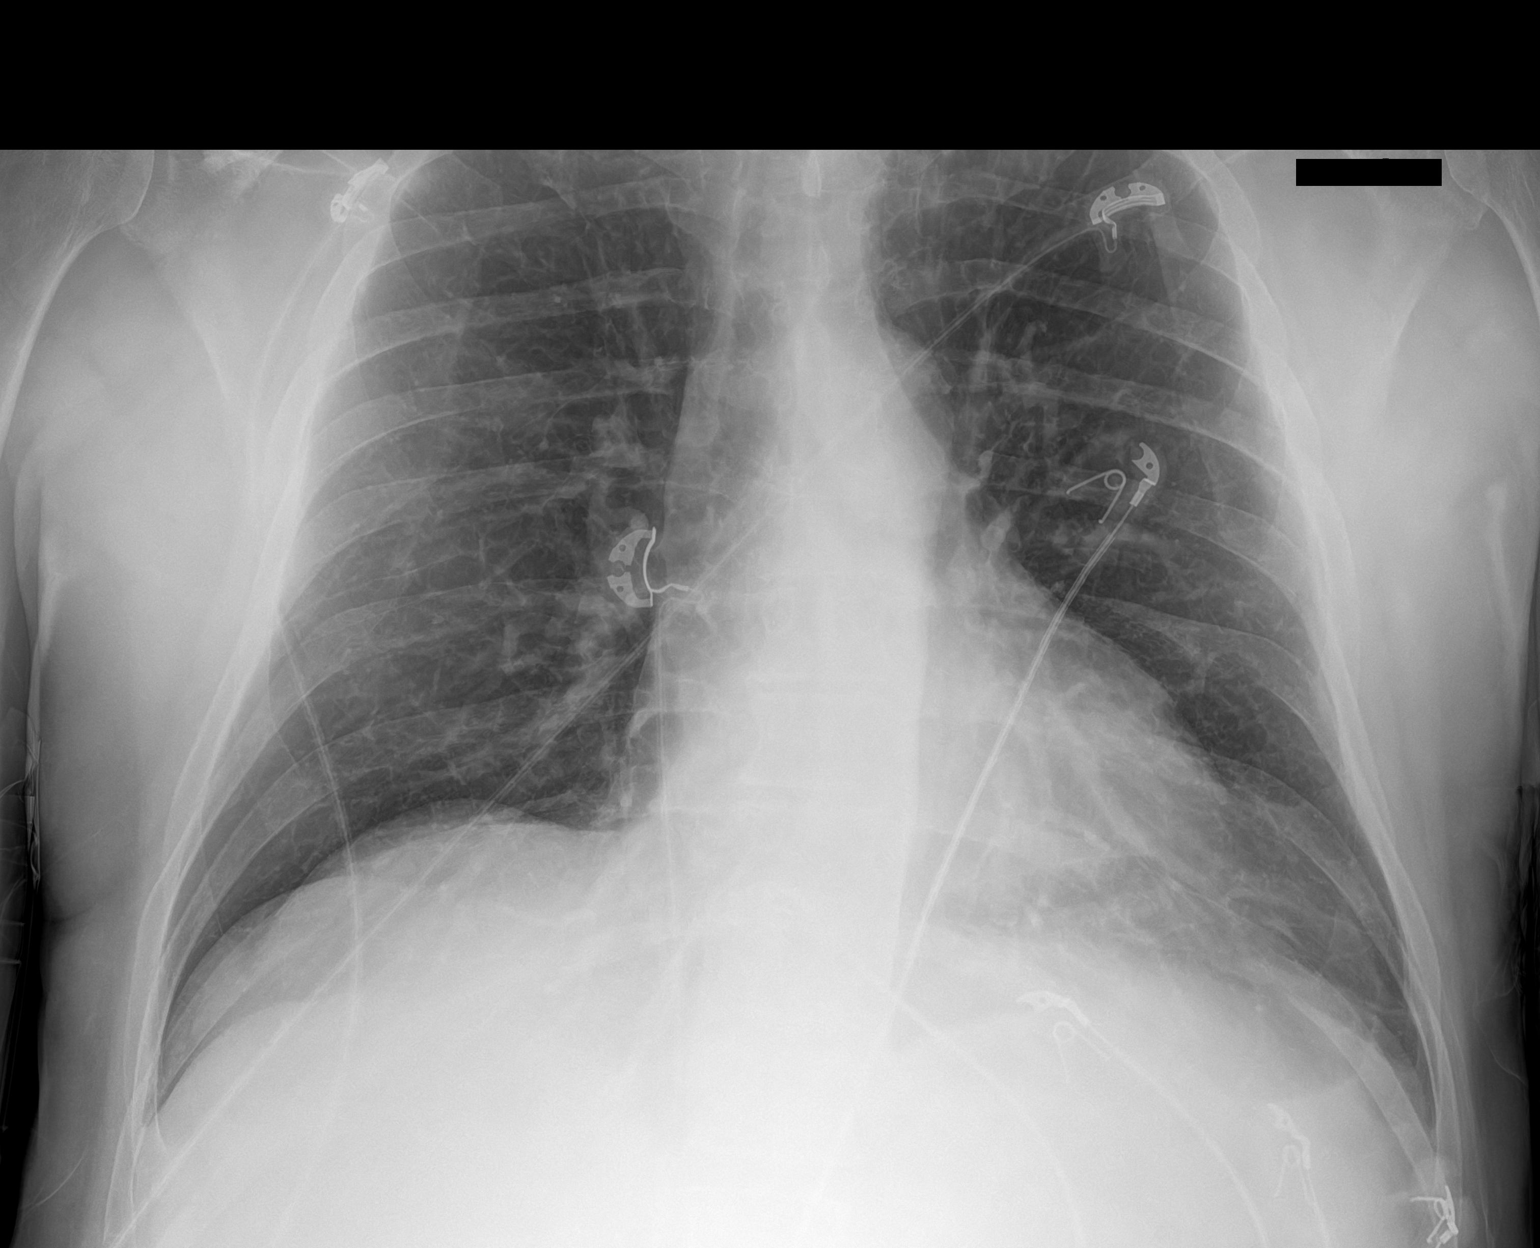

[1 of 1 positions shown; findings below may reference images not displayed]

FINDINGS: The heart size and mediastinal contours are within normal limits.
Both lungs are clear. The visualized skeletal structures are
unremarkable.
IMPRESSION: No active disease.

## 2022-01-05 ENCOUNTER — Ambulatory Visit: Payer: Medicare Other | Admitting: Emergency Medicine

## 2022-01-05 VITALS — BP 141/81 | HR 85

## 2022-01-05 DIAGNOSIS — I10 Essential (primary) hypertension: Secondary | ICD-10-CM

## 2022-01-05 NOTE — Progress Notes (Signed)
Patient presents for BP check, he has been checking at home. Ranging 130's- 80's ? ?This morning his cuff at home said 150/99 ? ?Our cuff reads 150/86 P- 85 & 141/81 P- 80 ? ?Please advise.  ?

## 2022-01-05 NOTE — Progress Notes (Signed)
Repeat BP in appropriate range for age. No changes.  His BP cuff looks to be reliable ? ?Monay Houlton M. Lajuana Ripple, DO ?[place] ? ? ? ?

## 2022-02-12 IMAGING — CT CT ANGIO CHEST
2 of 6 series · 19 of 36 positions shown · IV contrast (Omnipaque or Isovue)
Comparison: None.

CLINICAL DATA: Shortness of breath.

EXAM:
CT ANGIOGRAPHY CHEST WITH CONTRAST
TECHNIQUE: Multidetector CT imaging of the chest was performed using the
standard protocol during bolus administration of intravenous
contrast. Multiplanar CT image reconstructions and MIPs were
obtained to evaluate the vascular anatomy.
CONTRAST:  100mL OMNIPAQUE IOHEXOL 350 MG/ML SOLN

[Series 5: pe axial thins · axial · 0.75mm/px · z∈[+1170,+1514]mm · 18 of 384 slices shown]
[im 20/384  lung]
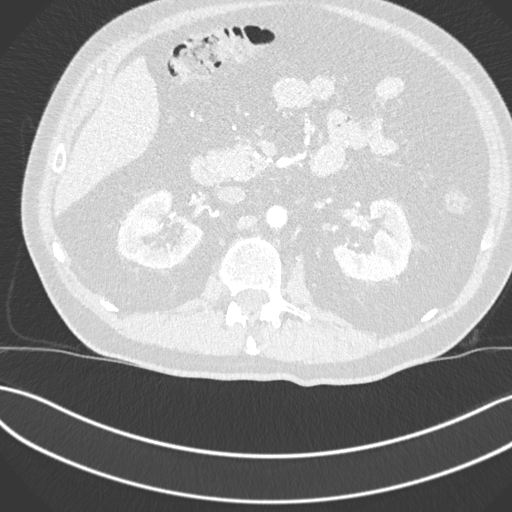
[im 39/384  mediastinal]
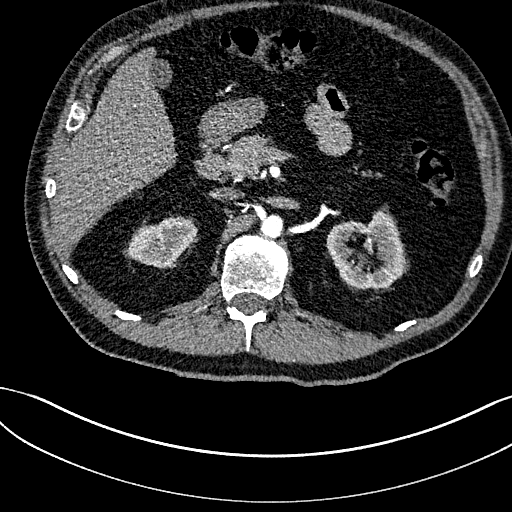
[im 58/384  lung]
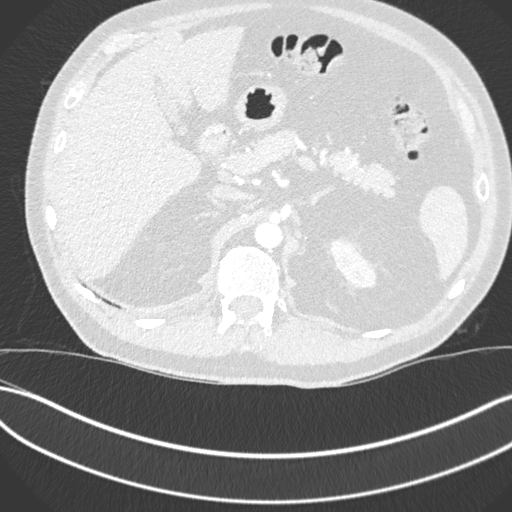
[im 77/384  mediastinal]
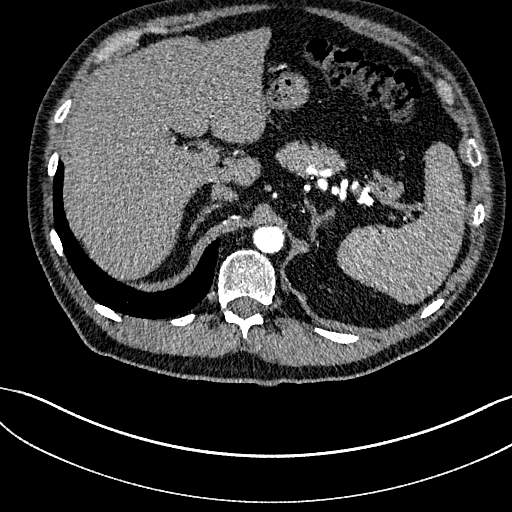
[im 96/384  lung]
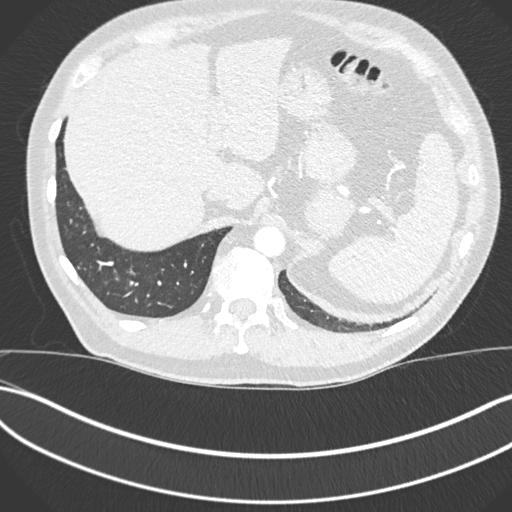
[im 115/384  mediastinal]
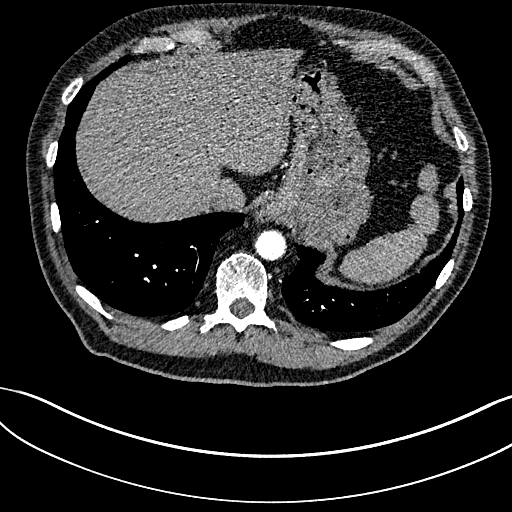
[im 135/384  lung]
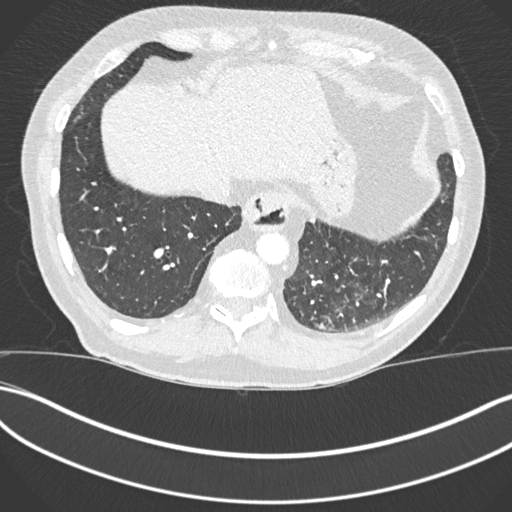
[im 154/384  mediastinal]
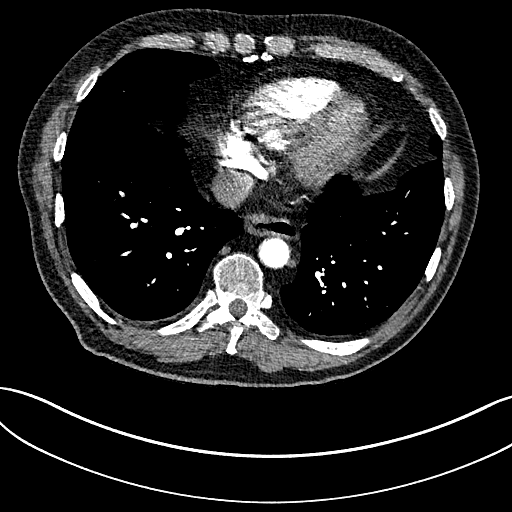
[im 173/384  lung]
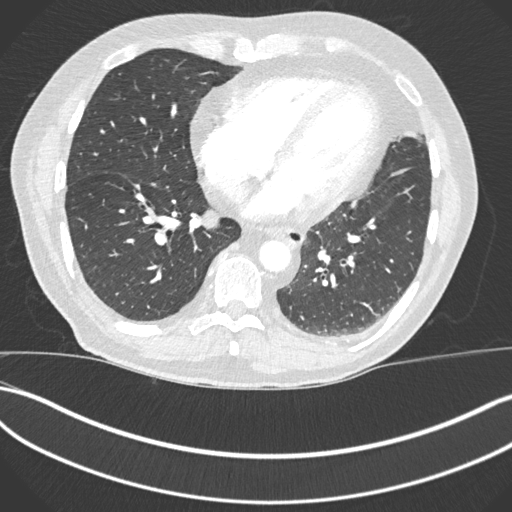
[im 211/384  mediastinal]
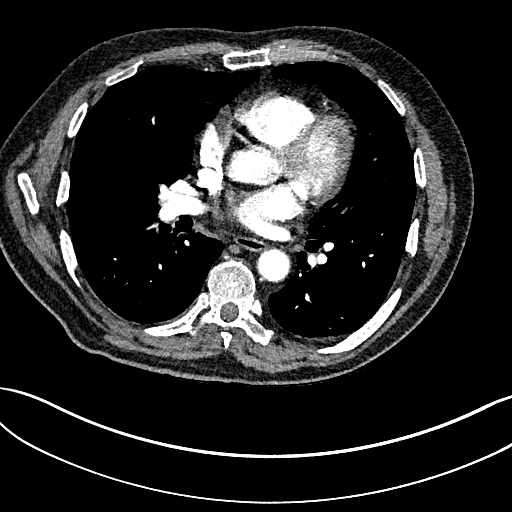
[im 230/384  lung]
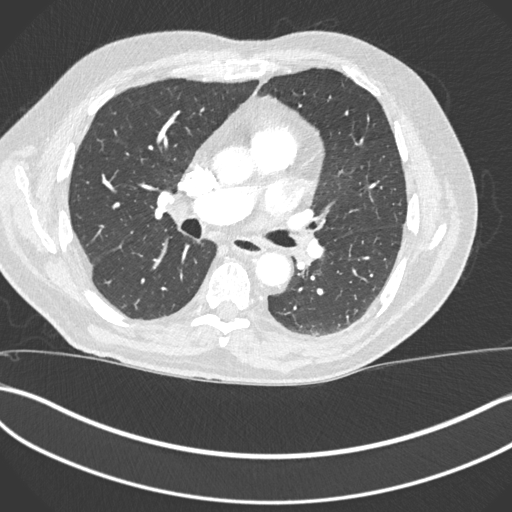
[im 249/384  mediastinal]
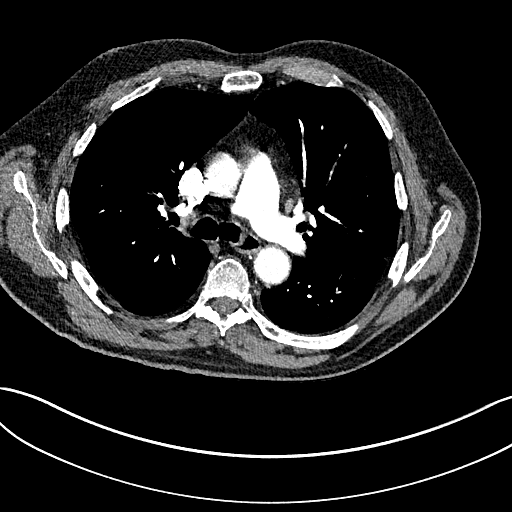
[im 269/384  lung]
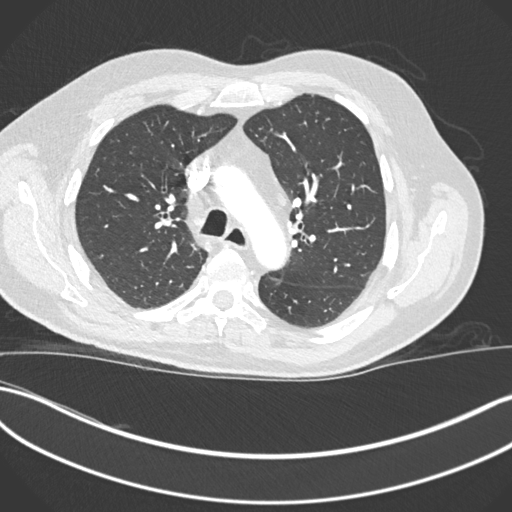
[im 288/384  mediastinal]
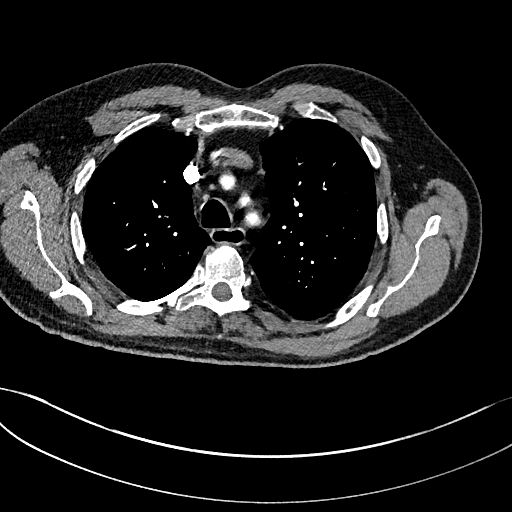
[im 307/384  lung]
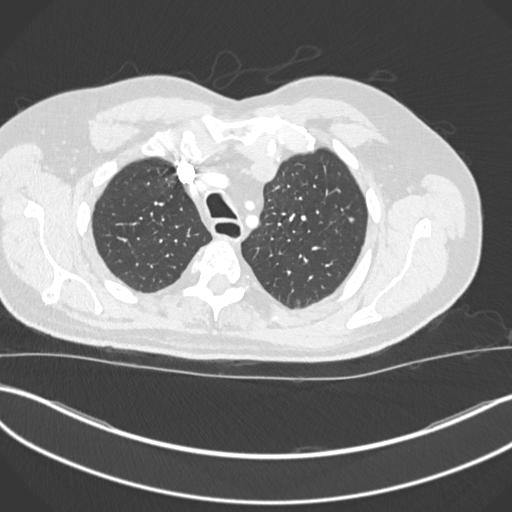
[im 326/384  mediastinal]
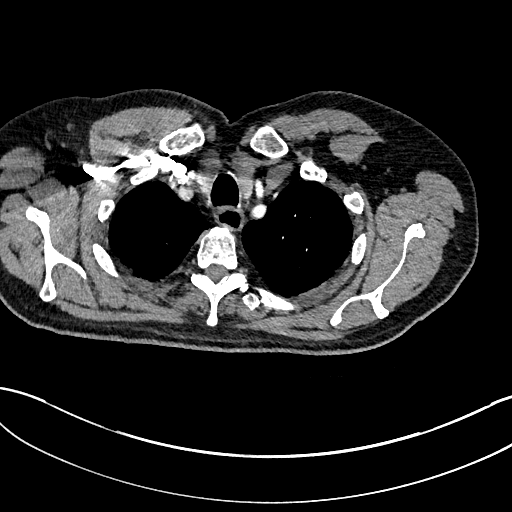
[im 345/384  lung]
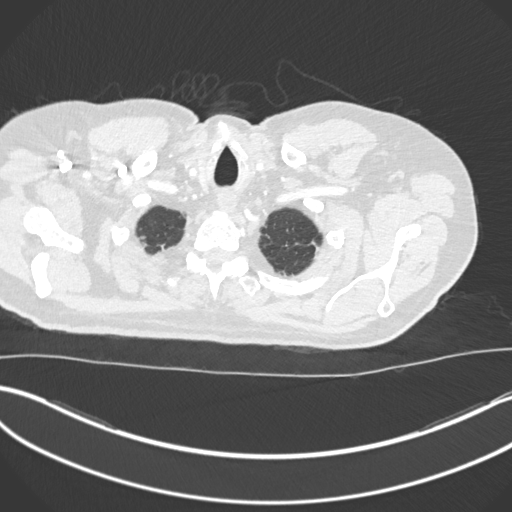
[im 364/384  mediastinal]
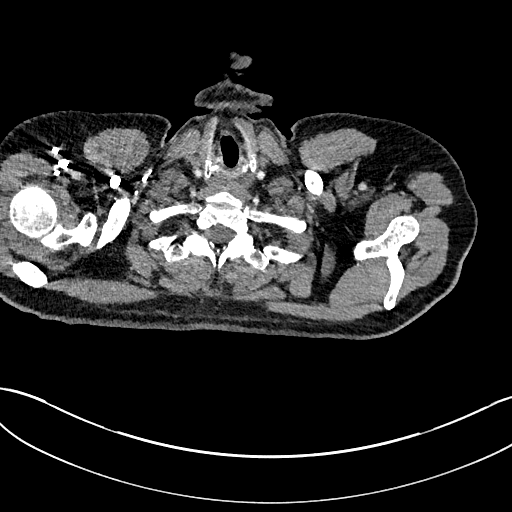

[Series 7: cor soft · coronal · 0.78mm/px · 1 of 151 slices shown]
[im 76/151  mediastinal]
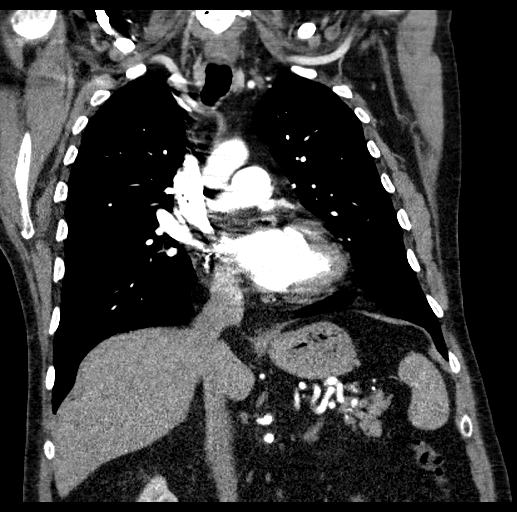

[19 of 36 positions shown; findings below may reference images not displayed]

FINDINGS: Cardiovascular: Satisfactory opacification of the pulmonary arteries
to the segmental level. No evidence of pulmonary embolism. Normal
heart size. No pericardial effusion.

Mediastinum/Nodes: Small sliding-type hiatal hernia is noted. No
adenopathy is noted. Thyroid gland is unremarkable.

Lungs/Pleura: No pneumothorax or pleural effusion is noted. Mild
biapical scarring is noted. Mild left posterior basilar subsegmental
atelectasis is noted.

Upper Abdomen: No acute abnormality.

Musculoskeletal: No chest wall abnormality. No acute or significant
osseous findings.

Review of the MIP images confirms the above findings.
IMPRESSION: 1. No definite evidence of pulmonary embolus.
2. Small sliding-type hiatal hernia.
3. Mild left posterior basilar subsegmental atelectasis.

## 2022-03-25 ENCOUNTER — Other Ambulatory Visit: Payer: Self-pay | Admitting: Family Medicine

## 2022-03-28 ENCOUNTER — Ambulatory Visit: Payer: Medicare Other | Admitting: Family Medicine

## 2022-03-29 ENCOUNTER — Ambulatory Visit (INDEPENDENT_AMBULATORY_CARE_PROVIDER_SITE_OTHER): Payer: Medicare Other | Admitting: Family Medicine

## 2022-03-29 ENCOUNTER — Encounter: Payer: Self-pay | Admitting: Family Medicine

## 2022-03-29 VITALS — BP 149/76 | HR 69 | Temp 98.6°F | Ht 72.0 in | Wt 193.0 lb

## 2022-03-29 DIAGNOSIS — Z6379 Other stressful life events affecting family and household: Secondary | ICD-10-CM | POA: Diagnosis not present

## 2022-03-29 DIAGNOSIS — I10 Essential (primary) hypertension: Secondary | ICD-10-CM

## 2022-03-29 DIAGNOSIS — E782 Mixed hyperlipidemia: Secondary | ICD-10-CM | POA: Diagnosis not present

## 2022-03-29 MED ORDER — LISINOPRIL 40 MG PO TABS: 40.0000 mg | ORAL_TABLET | Freq: Every day | ORAL | 3 refills | Status: DC

## 2022-03-29 MED ORDER — AMLODIPINE BESYLATE 5 MG PO TABS: 5.0000 mg | ORAL_TABLET | Freq: Every day | ORAL | 3 refills | Status: DC

## 2022-03-29 NOTE — Progress Notes (Signed)
Subjective: CC:HLD PCP: [name], DO PYP:PJKDT Ralph Stevens is a 71 y.o. male presenting to clinic today for:  1. HLD w/ HTN Started on crestor 3 months ago.  Compliant with medication.  Reports no chest pain, shortness of breath or jaundice.  Needs refills on Norvasc and lisinopril.  Took all meds this morning.  Blood pressure typically running 140s over 70s at home.  He admits to being under more stress because his wife has been suffering from vertigo and they cannot find an etiology.  He worries about her   ROS: Per HPI  Allergies  Allergen Reactions   Gabapentin Hives   Past Medical History:  Diagnosis Date   Acid reflux    Arthritis    Arthritis    Cataract    Mixed form OU   Enlarged prostate    PSA goes up and down    GERD (gastroesophageal reflux disease)    High cholesterol    Hyperlipidemia    Hypertension    Hypertensive retinopathy    OU    Current Outpatient Medications:    acetaminophen (TYLENOL) 500 MG tablet, Take 1,000 mg by mouth every 6 (six) hours as needed for moderate pain or headache., Disp: , Rfl:    alfuzosin (UROXATRAL) 10 MG 24 hr tablet, Take 1 tablet (10 mg total) by mouth every evening., Disp: 90 tablet, Rfl: 3   aspirin 81 MG chewable tablet, Chew 81 mg by mouth in the morning. , Disp: , Rfl:    fluticasone (FLONASE) 50 MCG/ACT nasal spray, Place 1-2 sprays into both nostrils daily as needed for allergies or rhinitis., Disp: 48 mL, Rfl: 3   ibuprofen (ADVIL) 200 MG tablet, Take 600 mg by mouth every 6 (six) hours as needed for headache or moderate pain., Disp: , Rfl:    metoprolol tartrate (LOPRESSOR) 50 MG tablet, TAKE 1 TABLET BY MOUTH TWICE A DAY, Disp: 180 tablet, Rfl: 0   rosuvastatin (CRESTOR) 10 MG tablet, Take 1 tablet (10 mg total) by mouth daily., Disp: 90 tablet, Rfl: 3   amLODipine (NORVASC) 5 MG tablet, Take 1 tablet (5 mg total) by mouth daily., Disp: 90 tablet, Rfl: 3   lisinopril (ZESTRIL) 40 MG tablet, Take 1  tablet (40 mg total) by mouth daily., Disp: 90 tablet, Rfl: 3 Social History   Socioeconomic History   Marital status: Married    Spouse name: [name]   Number of children: 3   Years of education: Not on file   Highest education level: Not on file  Occupational History    Comment: semi-retired   Tobacco Use   Smoking status: Never   Smokeless tobacco: Never  Vaping Use   Vaping Use: Never used  Substance and Sexual Activity   Alcohol use: Never   Drug use: Never   Sexual activity: Not on file  Other Topics Concern   Not on file  Social History Narrative   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  He currently is unemployed but is actively looking for work as he enjoys being busy.   He recently relocated from [place] and resides locally with his wife.  He has a son that lives in [place] but his other 2 daughters are residing in [place].   Social Determinants of Health   Financial Resource Strain: Low Risk  (09/22/2021)   Overall Financial Resource Strain (CARDIA)    Difficulty of Paying Living Expenses: Not very  hard  Food Insecurity: No Food Insecurity (09/22/2021)   Hunger Vital Sign    Worried About Running Out of Food in the Last Year: Never true    Ran Out of Food in the Last Year: Never true  Transportation Needs: No Transportation Needs (09/22/2021)   PRAPARE - Hydrologist (Medical): No    Lack of Transportation (Non-Medical): No  Physical Activity: Sufficiently Active (09/22/2021)   Exercise Vital Sign    Days of Exercise per Week: 7 days    Minutes of Exercise per Session: 30 min  Stress: No Stress Concern Present (09/22/2021)   [place]    Feeling of Stress : Not at all  Social Connections: Moderately Isolated (09/22/2021)   Social Connection and Isolation Panel [NHANES]    Frequency of Communication with Friends and Family: More  than three times a week    Frequency of Social Gatherings with Friends and Family: More than three times a week    Attends Religious Services: Never    Marine scientist or Organizations: No    Attends Archivist Meetings: Never    Marital Status: Married  Human resources officer Violence: Not At Risk (09/22/2021)   Humiliation, Afraid, Rape, and Kick questionnaire    Fear of Current or Ex-Partner: No    Emotionally Abused: No    Physically Abused: No    Sexually Abused: No   Family History  Problem Relation Age of Onset   Cancer Mother    Colon cancer Mother    Dementia Mother    Alzheimer's disease Mother    Emphysema Father    Hypothyroidism Sister    Thyroid disease Sister    Hypertension Sister    Multiple sclerosis Daughter    Heart defect Daughter    Colon cancer Maternal Grandmother    Heart disease Maternal Grandfather    Heart disease Paternal Grandmother    Cancer Paternal Grandfather        unknown ( mouth/throat)   Colon cancer Maternal Uncle     Objective: Office vital signs reviewed. BP (!) 149/76 Comment: home BP  Pulse 69   Temp 98.6 F (37 C)   Ht 6' (1.829 m)   Wt 193 lb (87.5 kg)   SpO2 96%   BMI 26.18 kg/m   Physical Examination:  General: Awake, alert, well nourished, No acute distress HEENT: Sclera white.  Moist mucous membranes Cardio: regular rate and rhythm, S1S2 heard, no murmurs appreciated Pulm: clear to auscultation bilaterally, no wheezes, rhonchi or rales; normal work of breathing on room air Psych: Mood stable, speech normal, affect appropriate  Assessment/ Plan: 71 y.o. male   Mixed hyperlipidemia - Plan: Lipid Panel, CMP14+EGFR  Essential hypertension - Plan: amLODipine (NORVASC) 5 MG tablet, lisinopril (ZESTRIL) 40 MG tablet  Stress due to illness of family member  Tolerating Crestor without difficulty.  Check fasting lipid.  Blood pressure medications renewed.  Home blood pressure acceptable.  Continue to  monitor blood pressures at home.  Offered medications to help but he is worried about being overly sedated and wants to keep a close eye on his wife.  Orders Placed This Encounter  Procedures   Lipid Panel   CMP14+EGFR   Meds ordered this encounter  Medications   amLODipine (NORVASC) 5 MG tablet    Sig: Take 1 tablet (5 mg total) by mouth daily.    Dispense:  90 tablet  Refill:  3   lisinopril (ZESTRIL) 40 MG tablet    Sig: Take 1 tablet (40 mg total) by mouth daily.    Dispense:  90 tablet    Refill:  [place], [place] [phone]

## 2022-03-29 NOTE — Patient Instructions (Signed)

## 2022-03-30 ENCOUNTER — Other Ambulatory Visit: Payer: Self-pay | Admitting: Family Medicine

## 2022-03-30 DIAGNOSIS — E782 Mixed hyperlipidemia: Secondary | ICD-10-CM

## 2022-03-30 LAB — CMP14+EGFR
ALT: 17 IU/L (ref 0–44)
AST: 16 IU/L (ref 0–40)
Albumin/Globulin Ratio: 1.8 (ref 1.2–2.2)
Albumin: 4.6 g/dL (ref 3.9–4.9)
Alkaline Phosphatase: 78 IU/L (ref 44–121)
BUN/Creatinine Ratio: 13 (ref 10–24)
BUN: 13 mg/dL (ref 8–27)
Bilirubin Total: 0.6 mg/dL (ref 0.0–1.2)
CO2: 21 mmol/L (ref 20–29)
Calcium: 9.3 mg/dL (ref 8.6–10.2)
Chloride: 103 mmol/L (ref 96–106)
Creatinine, Ser: 1 mg/dL (ref 0.76–1.27)
Globulin, Total: 2.6 g/dL (ref 1.5–4.5)
Glucose: 110 mg/dL — ABNORMAL HIGH (ref 70–99)
Potassium: 3.9 mmol/L (ref 3.5–5.2)
Sodium: 145 mmol/L — ABNORMAL HIGH (ref 134–144)
Total Protein: 7.2 g/dL (ref 6.0–8.5)
eGFR: 81 mL/min/{1.73_m2} (ref >59)

## 2022-03-30 LAB — LIPID PANEL
Chol/HDL Ratio: 4.6 ratio (ref 0.0–5.0)
Cholesterol, Total: 161 mg/dL (ref 100–199)
HDL: 35 mg/dL — ABNORMAL LOW (ref >39)
LDL Chol Calc (NIH): 102 mg/dL — ABNORMAL HIGH (ref 0–99)
Triglycerides: 134 mg/dL (ref 0–149)
VLDL Cholesterol Cal: 24 mg/dL (ref 5–40)

## 2022-03-30 MED ORDER — ROSUVASTATIN CALCIUM 20 MG PO TABS: 20.0000 mg | ORAL_TABLET | Freq: Every day | ORAL | 3 refills | Status: DC

## 2022-05-20 NOTE — Progress Notes (Signed)
[facility] Note  05/23/2022     CHIEF COMPLAINT Patient presents for Retina Follow Up    HISTORY OF PRESENT ILLNESS: Ralph Stevens is a 71 y.o. male who presents to the clinic today for:  HPI     Retina Follow Up   Patient presents with  Other (ERM).  In left eye.  Severity is moderate.  Duration of 6 months.  Since onset it is stable.  I, the attending physician,  performed the HPI with the patient and updated documentation appropriately.        Comments   Patient states vision the same OU.      Last edited by Bernarda Caffey, MD on 05/23/2022  1:13 PM.    Pt states vision is stable, left eye seems blurry in the morning, but blinking clears it up   Referring physician: [name], [place],  [place] [address]  HISTORICAL INFORMATION:   Selected notes from the [facility] Referred by PCP (Dr. Lajuana Ripple) for history of retinal tear OS   CURRENT MEDICATIONS: No current outpatient medications on file. (Ophthalmic Drugs)   No current facility-administered medications for this visit. (Ophthalmic Drugs)   Current Outpatient Medications (Other)  Medication Sig   acetaminophen (TYLENOL) 500 MG tablet Take 1,000 mg by mouth every 6 (six) hours as needed for moderate pain or headache.   alfuzosin (UROXATRAL) 10 MG 24 hr tablet Take 1 tablet (10 mg total) by mouth every evening.   amLODipine (NORVASC) 5 MG tablet Take 1 tablet (5 mg total) by mouth daily.   aspirin 81 MG chewable tablet Chew 81 mg by mouth in the morning.    fluticasone (FLONASE) 50 MCG/ACT nasal spray Place 1-2 sprays into both nostrils daily as needed for allergies or rhinitis.   ibuprofen (ADVIL) 200 MG tablet Take 600 mg by mouth every 6 (six) hours as needed for headache or moderate pain.   lisinopril (ZESTRIL) 40 MG tablet Take 1 tablet (40 mg total) by mouth daily.   metoprolol tartrate (LOPRESSOR) 50 MG tablet TAKE 1 TABLET BY MOUTH  TWICE A DAY   rosuvastatin (CRESTOR) 20 MG tablet Take 1 tablet (20 mg total) by mouth daily. New dose!   No current facility-administered medications for this visit. (Other)   REVIEW OF SYSTEMS: ROS   Positive for: Gastrointestinal, Skin, Musculoskeletal, Eyes Negative for: Constitutional, Neurological, Genitourinary, HENT, Endocrine, Cardiovascular, Respiratory, Psychiatric, Allergic/Imm, Heme/Lymph Last edited by Jobe Marker, COT on 05/23/2022 12:28 PM.     ALLERGIES Allergies  Allergen Reactions   Gabapentin Hives   PAST MEDICAL HISTORY Past Medical History:  Diagnosis Date   Acid reflux    Arthritis    Arthritis    Cataract    Mixed form OU   Enlarged prostate    PSA goes up and down    GERD (gastroesophageal reflux disease)    High cholesterol    Hyperlipidemia    Hypertension    Hypertensive retinopathy    OU   Past Surgical History:  Procedure Laterality Date   colonoscopy  2019   internal hemorrhoids and pancolonic diverticulosis. Surveillance due in 2024 due to history of adenomatous colon polyps and family history.   COLONOSCOPY N/A 06/23/2021   Hemorrhoids on perianal exam. 2 small 4 hemorrhoid tags, minimal internla hemorrhoids, scars appropriately located. Scattered medium-mouthed diverticula in entire colon.   EYE SURGERY     TONSILLECTOMY     age  71    FAMILY HISTORY Family History  Problem Relation Age of Onset   Cancer Mother    Colon cancer Mother    Dementia Mother    Alzheimer's disease Mother    Emphysema Father    Hypothyroidism Sister    Thyroid disease Sister    Hypertension Sister    Multiple sclerosis Daughter    Heart defect Daughter    Colon cancer Maternal Grandmother    Heart disease Maternal Grandfather    Heart disease Paternal Grandmother    Cancer Paternal Grandfather        unknown ( mouth/throat)   Colon cancer Maternal Uncle    SOCIAL HISTORY Social History   Tobacco Use   Smoking status: Never   Smokeless  tobacco: Never  Vaping Use   Vaping Use: Never used  Substance Use Topics   Alcohol use: Never   Drug use: Never       OPHTHALMIC EXAM:  Base Eye Exam     Visual Acuity (Snellen - Linear)       Right Left   Dist cc 20/20 -2 20/20 -1    Correction: Glasses         Tonometry (Tonopen, 12:33 PM)       Right Left   Pressure 18 15         Pupils       Dark Light Shape React APD   Right 3 2 Round Brisk None   Left 3 2 Round Brisk None         Visual Fields (Counting fingers)       Left Right    Full Full         Extraocular Movement       Right Left    Full, Ortho Full, Ortho         Neuro/Psych     Oriented x3: Yes   Mood/Affect: Normal         Dilation     Both eyes: 1.0% Mydriacyl, 2.5% Phenylephrine @ 12:33 PM           Slit Lamp and Fundus Exam     External Exam       Right Left   External  vesicles on left forehead and scalp -- improving, negative Hutchinson's sign         Slit Lamp Exam       Right Left   Lids/Lashes Dermatochalasis - upper lid Dermatochalasis - upper lid   Conjunctiva/Sclera Conjunctivochalasis inferiorly White and quiet   Cornea Trace Punctate epithelial erosions, mild Arcus trace PEE, trace, fine endo pigment   Anterior Chamber Deep,clear, narrow temporal angle Deep and clear, no cell or pigment   Iris Round and moderately dilated Round and moderately dilated   Lens 2-3+ Nuclear sclerosis, 2-3+ Cortical cataract 2-3+ Nuclear sclerosis, 2-3+ Cortical cataract   Anterior Vitreous Vitreous syneresis Vitreous syneresis, clear, no vitritis          Fundus Exam       Right Left   Disc Pink and Sharp Pink and Sharp   C/D Ratio 0.4 0.3   Macula Flat, Good foveal reflex, Retinal pigment epithelial mottling, No heme or edema Flat, blunted foveal reflex, ERM with mild striae, No heme   Vessels mild attenuation, mild tortuosity ttenuated, mild tortuosity   Periphery Attached, No heme  Attached,  operculated tear at 1200 with good laser surrounding, No heme; no new RT/RD  Refraction     Wearing Rx       Sphere Cylinder Axis Add   Right -2.75 +3.75 177 +2.50   Left -1.25 +2.75 173 +2.50            IMAGING AND PROCEDURES  Imaging and Procedures for '@TODAY'$ @  OCT, Retina - OU - Both Eyes       Right Eye Quality was good. Central Foveal Thickness: 296. Progression has been stable. Findings include normal foveal contour, no IRF, no SRF.   Left Eye Quality was good. Central Foveal Thickness: 372. Progression has been stable. Findings include no IRF, no SRF, abnormal foveal contour, epiretinal membrane, macular pucker (Persistent ERM and pucker, mild interval improvement in foveal contour).   Notes *Images captured and stored on drive  Diagnosis / Impression:  OD: NFP, no IRF/SRF OS: ERM w/ pucker stable; mild interval improvement in foveal contour  Clinical management:  See below  Abbreviations: NFP - Normal foveal profile. CME - cystoid macular edema. PED - pigment epithelial detachment. IRF - intraretinal fluid. SRF - subretinal fluid. EZ - ellipsoid zone. ERM - epiretinal membrane. ORA - outer retinal atrophy. ORT - outer retinal tubulation. SRHM - subretinal hyper-reflective material            ASSESSMENT/PLAN:    ICD-10-CM   1. Epiretinal membrane (ERM) of left eye  H35.372 OCT, Retina - OU - Both Eyes    2. History of repair of retinal tear by laser photocoagulation  Z98.890     3. Essential hypertension  I10     4. Hypertensive retinopathy of both eyes  H35.033     5. Combined forms of age-related cataract of both eyes  H25.813     6. Herpes zoster keratoconjunctivitis  B02.33       1. Epiretinal membrane, OS  - likely related to history of RT s/p laser retinopexy (see below)  - OCT shows persistent ERM and pucker, mild interval improvement in foveal contour  - asymptomatic, no metamorphopsia  - no indication for surgery at this  time  - monitor for now  - f/u 6-9 mos, DFE, OCT  2. History of retinal tear OS  - operculated tear located at 1200  - s/p laser retinopexy (Dr. Shana Chute, [place] [facility], [place], [place]) -- good laser surrounding  - stable   - no new RT/RD  - monitor  3,4. Hypertensive retinopathy OU  - discussed importance of tight BP control  - monitor  5. Mixed form age related cataracts OU  - The symptoms of cataract, surgical options, and treatments and risks were discussed with patient.  - discussed diagnosis and progression  - not yet visually significant  - monitor for now  6. H/o Herpes zoster with ocular involvement OS -- resolved  - pt diagnosed w/ herpes zoster on 3.21.22 and completed 1g po valtrex TID x10 days  - exam shows stable improvement in periorbital vesicles in V1 dermatome (no eyelid vesicles, negative Hutchinson's sign) and periorbital edema resolved  - cornea exam shows stable improvement in mild inf KP OS -- resolved  Ophthalmic Meds Ordered this visit:  No orders of the defined types were placed in this encounter.     Return for f/u 6-9 months, ERM OS, DFE, OCT.  There are no Patient Instructions on file for this visit.  This document serves as a record of services personally performed by Gardiner Sleeper, MD, PhD. It was created on their behalf by Roselee Nova, COMT.  The creation of this record is the provider's dictation and/or activities during the visit.  Electronically signed by: Roselee Nova, COMT 05/23/22 1:16 PM  This document serves as a record of services personally performed by Gardiner Sleeper, MD, PhD. It was created on their behalf by San Jetty. Owens Shark, OA an ophthalmic technician. The creation of this record is the provider's dictation and/or activities during the visit.    Electronically signed by: San Jetty. Owens Shark, [place] 10.02.2023 1:16 PM  Gardiner Sleeper, M.D., Ph.D. Diseases & Surgery of the Retina and Vitreous [facility]  I have reviewed the above documentation for accuracy and completeness, and I agree with the above. Gardiner Sleeper, M.D., Ph.D. 05/23/22 1:16 PM   Abbreviations: M myopia (nearsighted); A astigmatism; H hyperopia (farsighted); P presbyopia; Mrx spectacle prescription;  CTL contact lenses; OD right eye; OS left eye; OU both eyes  XT exotropia; ET esotropia; PEK punctate epithelial keratitis; PEE punctate epithelial erosions; DES dry eye syndrome; MGD meibomian gland dysfunction; ATs artificial tears; PFAT's preservative free artificial tears; [place] nuclear sclerotic cataract; PSC posterior subcapsular cataract; ERM epi-retinal membrane; PVD posterior vitreous detachment; RD retinal detachment; DM diabetes mellitus; DR diabetic retinopathy; NPDR non-proliferative diabetic retinopathy; PDR proliferative diabetic retinopathy; CSME clinically significant macular edema; DME diabetic macular edema; dbh dot blot hemorrhages; CWS cotton wool spot; POAG primary open angle glaucoma; C/D cup-to-disc ratio; HVF humphrey visual field; GVF goldmann visual field; OCT optical coherence tomography; IOP intraocular pressure; BRVO Branch retinal vein occlusion; CRVO central retinal vein occlusion; CRAO central retinal artery occlusion; BRAO branch retinal artery occlusion; RT retinal tear; SB scleral buckle; PPV pars plana vitrectomy; VH Vitreous hemorrhage; PRP panretinal laser photocoagulation; IVK intravitreal kenalog; VMT vitreomacular traction; MH Macular hole;  NVD neovascularization of the disc; NVE neovascularization elsewhere; AREDS age related eye disease study; ARMD age related macular degeneration; POAG primary open angle glaucoma; EBMD epithelial/anterior basement membrane dystrophy; ACIOL anterior chamber intraocular lens; IOL intraocular lens; PCIOL posterior chamber intraocular lens; Phaco/IOL phacoemulsification with intraocular lens placement; [place] photorefractive keratectomy; LASIK laser assisted in situ  keratomileusis; HTN hypertension; DM diabetes mellitus; COPD chronic obstructive pulmonary disease

## 2022-05-23 ENCOUNTER — Encounter (INDEPENDENT_AMBULATORY_CARE_PROVIDER_SITE_OTHER): Payer: Self-pay | Admitting: Ophthalmology

## 2022-05-23 ENCOUNTER — Ambulatory Visit (INDEPENDENT_AMBULATORY_CARE_PROVIDER_SITE_OTHER): Payer: Medicare Other | Admitting: Ophthalmology

## 2022-05-23 DIAGNOSIS — H35033 Hypertensive retinopathy, bilateral: Secondary | ICD-10-CM

## 2022-05-23 DIAGNOSIS — H35372 Puckering of macula, left eye: Secondary | ICD-10-CM | POA: Diagnosis not present

## 2022-05-23 DIAGNOSIS — B0233 Zoster keratitis: Secondary | ICD-10-CM | POA: Diagnosis not present

## 2022-05-23 DIAGNOSIS — Z9889 Other specified postprocedural states: Secondary | ICD-10-CM | POA: Diagnosis not present

## 2022-05-23 DIAGNOSIS — H25813 Combined forms of age-related cataract, bilateral: Secondary | ICD-10-CM | POA: Diagnosis not present

## 2022-05-23 DIAGNOSIS — I10 Essential (primary) hypertension: Secondary | ICD-10-CM

## 2022-06-24 ENCOUNTER — Other Ambulatory Visit: Payer: Self-pay | Admitting: Family Medicine

## 2022-06-29 ENCOUNTER — Ambulatory Visit: Payer: Medicare Other | Admitting: Family Medicine

## 2022-07-08 ENCOUNTER — Encounter: Payer: Self-pay | Admitting: Family Medicine

## 2022-07-08 ENCOUNTER — Ambulatory Visit (INDEPENDENT_AMBULATORY_CARE_PROVIDER_SITE_OTHER): Payer: Medicare Other | Admitting: Family Medicine

## 2022-07-08 VITALS — BP 133/83 | HR 75 | Temp 97.8°F | Ht 72.0 in | Wt 194.0 lb

## 2022-07-08 DIAGNOSIS — Z6379 Other stressful life events affecting family and household: Secondary | ICD-10-CM

## 2022-07-08 DIAGNOSIS — I10 Essential (primary) hypertension: Secondary | ICD-10-CM

## 2022-07-08 DIAGNOSIS — E782 Mixed hyperlipidemia: Secondary | ICD-10-CM

## 2022-07-08 MED ORDER — METOPROLOL TARTRATE 50 MG PO TABS
50.0000 mg | ORAL_TABLET | Freq: Two times a day (BID) | ORAL | 3 refills | Status: DC
Start: 2022-07-08 — End: 2023-08-14

## 2022-07-08 MED ORDER — BUSPIRONE HCL 5 MG PO TABS: ORAL_TABLET | ORAL | 0 refills | Status: DC

## 2022-07-08 NOTE — Progress Notes (Signed)
Subjective: CC: Follow-up hypertension, hyperlipidemia, stress PCP: [name], DO YTK:ZSWFU Ralph Stevens is a 71 y.o. male presenting to clinic today for:  1.  Stress Patient reports he has had difficulty sleeping of the last couple of months because of stress.  This was discussed at his last visit and at that time he did not want start any medications because he was afraid of being overly sedated and not able to arouse easily to help his wife, who has been suffering from severe vertigo such that she was hospitalized recently.  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  He admits to anxiety  2.  Hypertension with hyperlipidemia Compliant with medications.  Needs refill on Lopressor.  Blood pressure at home has been running 140s to 160s over 80s to 90s.  Usually different blood pressure readings in each arm.  No chest pain, shortness of breath, dizziness or visual disturbance reported   ROS: Per HPI  Allergies  Allergen Reactions   Gabapentin Hives   Past Medical History:  Diagnosis Date   Acid reflux    Arthritis    Arthritis    Cataract    Mixed form OU   Enlarged prostate    PSA goes up and down    GERD (gastroesophageal reflux disease)    High cholesterol    Hyperlipidemia    Hypertension    Hypertensive retinopathy    OU    Current Outpatient Medications:    acetaminophen (TYLENOL) 500 MG tablet, Take 1,000 mg by mouth every 6 (six) hours as needed for moderate pain or headache., Disp: , Rfl:    alfuzosin (UROXATRAL) 10 MG 24 hr tablet, Take 1 tablet (10 mg total) by mouth every evening., Disp: 90 tablet, Rfl: 3   amLODipine (NORVASC) 5 MG tablet, Take 1 tablet (5 mg total) by mouth daily., Disp: 90 tablet, Rfl: 3   aspirin 81 MG chewable tablet, Chew 81 mg by mouth in the morning. , Disp: , Rfl:    fluticasone (FLONASE) 50 MCG/ACT nasal spray, Place 1-2 sprays into both nostrils  daily as needed for allergies or rhinitis., Disp: 48 mL, Rfl: 3   ibuprofen (ADVIL) 200 MG tablet, Take 600 mg by mouth every 6 (six) hours as needed for headache or moderate pain., Disp: , Rfl:    lisinopril (ZESTRIL) 40 MG tablet, Take 1 tablet (40 mg total) by mouth daily., Disp: 90 tablet, Rfl: 3   metoprolol tartrate (LOPRESSOR) 50 MG tablet, TAKE 1 TABLET BY MOUTH TWICE A DAY, Disp: 180 tablet, Rfl: 0   rosuvastatin (CRESTOR) 20 MG tablet, Take 1 tablet (20 mg total) by mouth daily. 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  He currently  is unemployed but is actively looking for work as he enjoys being busy.   He recently relocated from [place] and resides locally with his wife.  He has a son that lives in [place] but his other 2 daughters are residing in [place].   Social Determinants of Health   Financial Resource Strain: Low Risk  (09/22/2021)   Overall Financial Resource Strain (CARDIA)    Difficulty of Paying Living Expenses: Not very hard  Food Insecurity: No Food Insecurity (09/22/2021)   Hunger Vital Sign    Worried About Running Out of Food in the Last Year: Never true    Ran Out of Food in the Last Year: Never true  Transportation Needs: No Transportation Needs (09/22/2021)   PRAPARE - Hydrologist (Medical): No    Lack of Transportation (Non-Medical): No  Physical  Activity: Sufficiently Active (09/22/2021)   Exercise Vital Sign    Days of Exercise per Week: 7 days    Minutes of Exercise per Session: 30 min  Stress: No Stress Concern Present (09/22/2021)   [place]    Feeling of Stress : Not at all  Social Connections: Moderately Isolated (09/22/2021)   Social Connection and Isolation Panel [NHANES]    Frequency of Communication with Friends and Family: More than three times a week    Frequency of Social Gatherings with Friends and Family: More than three times a week    Attends Religious Services: Never    Marine scientist or Organizations: No    Attends Archivist Meetings: Never    Marital Status: Married  Human resources officer Violence: Not At Risk (09/22/2021)   Humiliation, Afraid, Rape, and Kick questionnaire    Fear of Current or Ex-Partner: No    Emotionally Abused: No    Physically Abused: No    Sexually Abused: No   Family History  Problem Relation Age of Onset   Cancer Mother    Colon cancer Mother    Dementia Mother    Alzheimer's disease Mother    Emphysema Father    Hypothyroidism Sister    Thyroid disease Sister    Hypertension Sister    Multiple sclerosis Daughter    Heart defect Daughter    Colon cancer Maternal Grandmother    Heart disease Maternal Grandfather    Heart disease Paternal Grandmother    Cancer Paternal Grandfather        unknown ( mouth/throat)   Colon cancer Maternal Uncle     Objective: Office vital signs reviewed. BP 133/83   Pulse 75   Temp 97.8 F (36.6 C)   Ht 6' (1.829 m)   Wt 194 lb (88 kg)   SpO2 95%   BMI 26.31 kg/m   Physical Examination:  General: Awake, alert, well nourished, No acute distress HEENT: Sclera white.  Moist mucous membranes Cardio: regular rate and rhythm, S1S2 heard, no murmurs appreciated Pulm: clear to auscultation bilaterally, no wheezes, rhonchi or rales; normal work of breathing on  room air Psych: Somewhat distressed appearing.  Intermittently fidgety     07/08/2022    9:09 AM 03/29/2022    9:07 AM 12/22/2021    8:45 AM  Depression screen PHQ 2/9  Decreased Interest 0 0 0  Down, Depressed, Hopeless 1 0 0  PHQ - 2 Score 1 0 0  Altered sleeping 0 2   Tired, decreased energy 0 1   Change in appetite 0 0  Feeling bad or failure about yourself  1 0   Trouble concentrating 1 0   Moving slowly or fidgety/restless 0 0   Suicidal thoughts 0 0   PHQ-9 Score 3 3   Difficult doing work/chores Somewhat difficult Not difficult at all       07/08/2022    9:09 AM 03/29/2022    9:07 AM 12/22/2021    8:45 AM 09/28/2021   10:26 AM  GAD 7 : Generalized Anxiety Score  Nervous, Anxious, on Edge 1 1 0 0  Control/stop worrying 1 1 0 0  Worry too much - different things 0 1 0 0  Trouble relaxing 0 1 0 0  Restless 0 0 0 0  Easily annoyed or irritable 1 0 0 0  Afraid - awful might happen 0 0 0 0  Total GAD 7 Score 3 4 0 0  Anxiety Difficulty Somewhat difficult Not difficult at all Not difficult at all Not difficult at all    Assessment/ Plan: 71 y.o. male   Stress due to illness of family member - Plan: busPIRone (BUSPAR) 5 MG tablet  Essential hypertension - Plan: metoprolol tartrate (LOPRESSOR) 50 MG tablet  Mixed hyperlipidemia  Start BuSpar.  This should be nonsedating for the patient and help with anxiety.  I am going to reassess him in about 4 to 6 weeks, sooner if concerns arise.  Could consider SSRI if no significant improvement with BuSpar.  BP controlled upon recheck.  No changes.  Continue current cholesterol control.  No changes.  Plan for fasting lipid at next visit given persistent elevation in LDL  No orders of the defined types were placed in this encounter.  No orders of the defined types were placed in this encounter.    [name], DO [place] [phone]

## 2022-07-13 ENCOUNTER — Other Ambulatory Visit: Payer: Self-pay | Admitting: Family Medicine

## 2022-07-13 ENCOUNTER — Encounter: Payer: Self-pay | Admitting: Family Medicine

## 2022-07-13 DIAGNOSIS — Z6379 Other stressful life events affecting family and household: Secondary | ICD-10-CM

## 2022-07-13 MED ORDER — ESCITALOPRAM OXALATE 10 MG PO TABS: 10.0000 mg | ORAL_TABLET | Freq: Every day | ORAL | 3 refills | Status: DC

## 2022-07-21 ENCOUNTER — Ambulatory Visit: Payer: Medicare Other | Admitting: Gastroenterology

## 2022-07-21 ENCOUNTER — Encounter: Payer: Self-pay | Admitting: Gastroenterology

## 2022-07-21 VITALS — BP 163/87 | HR 80 | Temp 98.0°F | Ht 72.0 in | Wt 190.2 lb

## 2022-07-21 DIAGNOSIS — K648 Other hemorrhoids: Secondary | ICD-10-CM | POA: Insufficient documentation

## 2022-07-21 NOTE — Progress Notes (Signed)
Gastroenterology Office Note     Primary Care Physician:  [name], DO  Primary Gastroenterologist: Dr. Gala Romney    Chief Complaint   Chief Complaint  Patient presents with   Hemorrhoids    Had an episode of heavy bleeding about six weeks ago.      History of Present Illness   Ralph Stevens is a 71 y.o. male presenting today in follow-up with a history of symptomatic hemorrhoids, undergoing banding of right posterior, left lateral, right anterior, and left lateral in 2022. He underwent colonoscopy in Nov 2022;  Hemorrhoids on perianal exam. 2 small 4 hemorrhoid tags, minimal internal hemorrhoids, scars appropriately located. Scattered medium-mouthed diverticula in entire colon. Family history of colon cancer in sister, mother, and several aunts/uncles.     6 weeks ago significant bleeding. Intermittent prolapsing. Grade 2 by description. Soiling (mucus) occasionally. No constipation. No straining. No straining during the rectal bleeding. No prolonged toilet time. Been under a lot of stress with wife's illness.    Past Medical History:  Diagnosis Date   Acid reflux    Arthritis    Arthritis    Cataract    Mixed form OU   Enlarged prostate    PSA goes up and down    GERD (gastroesophageal reflux disease)    High cholesterol    Hyperlipidemia    Hypertension    Hypertensive retinopathy    OU    Past Surgical History:  Procedure Laterality Date   colonoscopy  2019   internal hemorrhoids and pancolonic diverticulosis. Surveillance due in 2024 due to history of adenomatous colon polyps and family history.   COLONOSCOPY N/A 06/23/2021   Hemorrhoids on perianal exam. 2 small 4 hemorrhoid tags, minimal internla hemorrhoids, scars appropriately located. Scattered medium-mouthed diverticula in entire colon.   EYE SURGERY     TONSILLECTOMY     age 22     Current Outpatient Medications  Medication Sig Dispense Refill   alfuzosin (UROXATRAL) 10 MG 24 hr  tablet Take 1 tablet (10 mg total) by mouth every evening. 90 tablet 3   amLODipine (NORVASC) 5 MG tablet Take 1 tablet (5 mg total) by mouth daily. 90 tablet 3   aspirin 81 MG chewable tablet Chew 81 mg by mouth in the morning.      escitalopram (LEXAPRO) 10 MG tablet Take 1 tablet (10 mg total) by mouth daily. 90 tablet 3   fluticasone (FLONASE) 50 MCG/ACT nasal spray Place 1-2 sprays into both nostrils daily as needed for allergies or rhinitis. 48 mL 3   ibuprofen (ADVIL) 200 MG tablet Take 600 mg by mouth every 6 (six) hours as needed for headache or moderate pain.     lisinopril (ZESTRIL) 40 MG tablet Take 1 tablet (40 mg total) by mouth daily. 90 tablet 3   metoprolol tartrate (LOPRESSOR) 50 MG tablet Take 1 tablet (50 mg total) by mouth 2 (two) times daily. 180 tablet 3   rosuvastatin (CRESTOR) 20 MG tablet Take 1 tablet (20 mg total) by mouth daily. New dose! 90 tablet 3   No current facility-administered medications for this visit.    Allergies as of 07/21/2022 - Review Complete 07/21/2022  Allergen Reaction Noted   Gabapentin Hives 12/07/2020    Family History  Problem Relation Age of Onset   Cancer Mother    Colon cancer Mother    Dementia Mother    Alzheimer's disease Mother    Emphysema Father    Hypothyroidism Sister  Thyroid disease Sister    Hypertension Sister    Multiple sclerosis Daughter    Heart defect Daughter    Colon cancer Maternal Grandmother    Heart disease Maternal Grandfather    Heart disease Paternal Grandmother    Cancer Paternal Grandfather        unknown ( mouth/throat)   Colon cancer Maternal Uncle     Social History   Socioeconomic History   Marital status: Married    Spouse name: [name]   Number of children: 3   Years of education: Not on file   Highest education level: Not on file  Occupational History    Comment: semi-retired   Tobacco Use   Smoking status: Never   Smokeless tobacco: Never  Vaping Use   Vaping Use: Never used   Substance and Sexual Activity   Alcohol use: Never   Drug use: Never   Sexual activity: Not Currently  Other Topics Concern   Not on file  Social History Narrative   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  He currently is unemployed but is actively looking for work as he enjoys being busy.   He recently relocated from [place] and resides locally with his wife.  He has a son that lives in [place] but his other 2 daughters are residing in [place].   Social Determinants of Health   Financial Resource Strain: Low Risk  (09/22/2021)   Overall Financial Resource Strain (CARDIA)    Difficulty of Paying Living Expenses: Not very hard  Food Insecurity: No Food Insecurity (09/22/2021)   Hunger Vital Sign    Worried About Running Out of Food in the Last Year: Never true    Ran Out of Food in the Last Year: Never true  Transportation Needs: No Transportation Needs (09/22/2021)   PRAPARE - Hydrologist (Medical): No    Lack of Transportation (Non-Medical): No  Physical Activity: Sufficiently Active (09/22/2021)   Exercise Vital Sign    Days of Exercise per Week: 7 days    Minutes of Exercise per Session: 30 min  Stress: No Stress Concern Present (09/22/2021)   [place]    Feeling of Stress : Not at all  Social Connections: Moderately Isolated (09/22/2021)   Social Connection and Isolation Panel [NHANES]    Frequency of Communication with Friends and Family: More than three times a week    Frequency of Social Gatherings with Friends and Family: More than three times a week    Attends Religious Services: Never    Marine scientist or Organizations: No    Attends Archivist Meetings: Never    Marital Status: Married  Human resources officer Violence: Not At Risk (09/22/2021)   Humiliation, Afraid, Rape, and Kick questionnaire    Fear of Current or  Ex-Partner: No    Emotionally Abused: No    Physically Abused: No    Sexually Abused: No     Review of Systems   Gen: Denies any fever, chills, fatigue, weight loss, lack of appetite.  CV: Denies chest pain, heart palpitations, peripheral edema, syncope.  Resp: Denies shortness of breath at rest or with exertion. Denies wheezing or cough.  GI: Denies dysphagia or odynophagia. Denies jaundice, hematemesis, fecal incontinence. GU : Denies urinary burning, urinary frequency, urinary hesitancy MS: Denies joint pain, muscle weakness, cramps, or limitation of movement.  Derm: Denies  rash, itching, dry skin Psych: Denies depression, anxiety, memory loss, and confusion Heme: Denies bruising, bleeding, and enlarged lymph nodes.   Physical Exam   BP (!) 163/87 (BP Location: Right Arm, Patient Position: Sitting, Cuff Size: Large)   Pulse 80   Temp 98 F (36.7 C) (Oral)   Ht 6' (1.829 m)   Wt 190 lb 3.2 oz (86.3 kg)   SpO2 97%   BMI 25.80 kg/m  General:   Alert and oriented. Pleasant and cooperative. Well-nourished and well-developed.  Head:  Normocephalic and atraumatic. Eyes:  Without icterus Rectal:  external, non-thrombosed hemorrhoids and skin tags. Small prolapsing internal hemorrhoid. Anoscopy performed with right anterior Grade 2 hemorrhoid  Msk:  Symmetrical without gross deformities. Normal posture. Extremities:  Without edema. Neurologic:  Alert and  oriented x4;  grossly normal neurologically. Skin:  Intact without significant lesions or rashes. Psych:  Alert and cooperative. Normal mood and affect.   [place] BANDING PROCEDURE NOTE  Aidenjames Heckmann is a 71 y.o. male presenting today for consideration of hemorrhoid banding. Last colonoscopy Nov 2022.   The patient presents with symptomatic grade 2 hemorrhoids, unresponsive to maximal medical therapy, requesting rubber band ligation of his hemorrhoidal disease. All risks, benefits, and alternative forms of therapy were  described and informed consent was obtained.  The decision was made to band neutrally, and the [place] was used to perform band ligation without complication. Digital anorectal examination was then performed to assure proper positioning of the band, and to adjust the banded tissue as required. The patient was discharged home without pain or other issues. Dietary and behavioral recommendations were given, along with follow-up instructions. The patient will return in several weeks for followup and possible additional banding as required.  No complications were encountered and the patient tolerated the procedure well.   Annitta Needs, PhD, ANP-BC [facility]     Assessment   Riddik Senna Darnell is a 71 y.o. male presenting today in follow-up with a history of  symptomatic hemorrhoids, undergoing banding of right posterior, left lateral, right anterior, and left lateral in 2022. Presenting today with recurrent bleeding.  Grade 2 hemorrhoids by report and anoscopy, with prolapsing of right anterior column. See banding procedure notes. We will see him again in about a month with repeat anoscopy. May need additional banding.     PLAN    Continue supportive measures, avoidance of straining/constipation/limiting toilet time Return in 4 weeks for anoscopy   Annitta Needs, PhD, ANP-BC [facility]

## 2022-07-21 NOTE — Patient Instructions (Signed)
  Please avoid straining as you are doing.  You should limit your toilet time to 2-3 minutes at the most.   I recommend Benefiber 2 teaspoons each morning in the beverage of your choice!  Please call me with any concerns or issues!  I will see you in follow-up for additional banding if needed in about 4 weeks !  I enjoyed seeing you again today! As you know, I value our relationship and want to provide genuine, compassionate, and quality care. I welcome your feedback. If you receive a survey regarding your visit,  I greatly appreciate you taking time to fill this out. See you next time!  Annitta Needs, PhD, ANP-BC [facility]

## 2022-07-26 ENCOUNTER — Ambulatory Visit (INDEPENDENT_AMBULATORY_CARE_PROVIDER_SITE_OTHER): Payer: Medicare Other | Admitting: Urology

## 2022-07-26 ENCOUNTER — Encounter: Payer: Self-pay | Admitting: Urology

## 2022-07-26 VITALS — BP 143/88 | HR 86

## 2022-07-26 DIAGNOSIS — R350 Frequency of micturition: Secondary | ICD-10-CM

## 2022-07-26 DIAGNOSIS — N401 Enlarged prostate with lower urinary tract symptoms: Secondary | ICD-10-CM | POA: Diagnosis not present

## 2022-07-26 LAB — URINALYSIS, ROUTINE W REFLEX MICROSCOPIC
Bilirubin, UA: NEGATIVE
Glucose, UA: NEGATIVE
Ketones, UA: NEGATIVE
Leukocytes,UA: NEGATIVE
Nitrite, UA: NEGATIVE
Protein,UA: NEGATIVE
Specific Gravity, UA: 1.015 (ref 1.005–1.030)
Urobilinogen, Ur: 0.2 mg/dL (ref 0.2–1.0)
pH, UA: 6 (ref 5.0–7.5)

## 2022-07-26 LAB — MICROSCOPIC EXAMINATION: Bacteria, UA: NONE SEEN

## 2022-07-26 MED ORDER — ALFUZOSIN HCL ER 10 MG PO TB24: 10.0000 mg | ORAL_TABLET | Freq: Every evening | ORAL | 3 refills | Status: DC

## 2022-07-26 NOTE — Progress Notes (Signed)
History of Present Illness:   2.2.2021: Here today referred by Dr Lajuana Ripple for follow-up on his elevated PSA and 'abnormally shaped' prostate w/ mild LUTS. He is currently on tamsulosin -- previously on dual medical therapy w/ alfuzosin but stopped because he was having issues with it in combination w/ HCTZ. His PSA has apparently fluctuated over the last few years (at one point he thinks it was up to 6, but it came back down to what he says was normal for him), but he does not have any old records for these data.    6.8.2021: Here for follow-up after catheter removal 2 wks prior. He has been on alfuzosin 2x daily since last visit and feels as though his urinary pattern and sx's are significantly improved. He still thinks his stream is weaker than before his recently retentive episode but feels as though this is still improving.    12.7.2021: Pt here after septic bacterial infection. He notes that he has lingering fatigue but is otherwise recovering well. He continues on alfuzosin but notes significant urinary frequency during the day. He notes a strong FOS with a full bladder but notes that the force diminishes while emptying his bladder. He reports that he is able to suppress urinary urge without issue and denies any recent foul-smelling urine or dysuria.   PSA: 3.0 (10.7.2021)   12.6.2022: He is still on alfuzosin, once a day.  IPSS 10, quality-of-life score 2. PSA 4.3  12.5.2023: PSA 4/23 2.5.  Here for routine check.  He is not having any real issues urologically speaking.  Still on alfuzosin.  IPSS 10, quality-of-life score 3.   Past Medical History:  Diagnosis Date   Acid reflux    Arthritis    Arthritis    Cataract    Mixed form OU   Enlarged prostate    PSA goes up and down    GERD (gastroesophageal reflux disease)    High cholesterol    Hyperlipidemia    Hypertension    Hypertensive retinopathy    OU    Past Surgical History:  Procedure Laterality Date   colonoscopy   2019   internal hemorrhoids and pancolonic diverticulosis. Surveillance due in 2024 due to history of adenomatous colon polyps and family history.   COLONOSCOPY N/A 06/23/2021   Hemorrhoids on perianal exam. 2 small 4 hemorrhoid tags, minimal internla hemorrhoids, scars appropriately located. Scattered medium-mouthed diverticula in entire colon.   EYE SURGERY     TONSILLECTOMY     age 71     Home Medications:  Allergies as of 07/26/2022       Reactions   Gabapentin Hives        Medication List        Accurate as of July 26, 2022  6:26 AM. If you have any questions, ask your nurse or doctor.          alfuzosin 10 MG 24 hr tablet Commonly known as: UROXATRAL Take 1 tablet (10 mg total) by mouth every evening.   amLODipine 5 MG tablet Commonly known as: NORVASC Take 1 tablet (5 mg total) by mouth daily.   aspirin 81 MG chewable tablet Chew 81 mg by mouth in the morning.   escitalopram 10 MG tablet Commonly known as: Lexapro Take 1 tablet (10 mg total) by mouth daily.   fluticasone 50 MCG/ACT nasal spray Commonly known as: FLONASE Place 1-2 sprays into both nostrils daily as needed for allergies or rhinitis.   ibuprofen 200 MG tablet Commonly  known as: ADVIL Take 600 mg by mouth every 6 (six) hours as needed for headache or moderate pain.   lisinopril 40 MG tablet Commonly known as: ZESTRIL Take 1 tablet (40 mg total) by mouth daily.   metoprolol tartrate 50 MG tablet Commonly known as: LOPRESSOR Take 1 tablet (50 mg total) by mouth 2 (two) times daily.   rosuvastatin 20 MG tablet Commonly known as: Crestor Take 1 tablet (20 mg total) by mouth daily. New dose!        Allergies:  Allergies  Allergen Reactions   Gabapentin Hives    Family History  Problem Relation Age of Onset   Cancer Mother    Colon cancer Mother    Dementia Mother    Alzheimer's disease Mother    Emphysema Father    Hypothyroidism Sister    Thyroid disease Sister     Hypertension Sister    Multiple sclerosis Daughter    Heart defect Daughter    Colon cancer Maternal Grandmother    Heart disease Maternal Grandfather    Heart disease Paternal Grandmother    Cancer Paternal Grandfather        unknown ( mouth/throat)   Colon cancer Maternal Uncle     Social History:  reports that he has never smoked. He has never used smokeless tobacco. He reports that he does not drink alcohol and does not use drugs.  ROS: A complete review of systems was performed.  All systems are negative except for pertinent findings as noted.  Physical Exam:  Vital signs in last 24 hours: There were no vitals taken for this visit. Constitutional:  Alert and oriented, No acute distress Cardiovascular: Regular rate  Respiratory: Normal respiratory effort Genitourinary: Normal anal sphincter tone.  Gland 40 g, symmetric, nonnodular, nontender. Lymphatic: No lymphadenopathy Neurologic: Grossly intact, no focal deficits Psychiatric: Normal mood and affect  I have reviewed prior pt notes  I have reviewed notes from referring/previous physicians--he has had recent hemorrhoid banding  I have reviewed urinalysis results-clear  I have reviewed prior PSA results-2.5 in April, 2023.  I have reviewed IPSS sheet   Impression/Assessment:  1.  BPH with symptoms, adequate treatment with alfuzosin  2.  Screening for prostate cancer-normal PSA recently  Plan:  1.  Continue alfuzosin  2.  I will see back in 1 year for recheck  3.  He states that he will get his PSA drawn by Dr. Lajuana Ripple

## 2022-08-12 ENCOUNTER — Ambulatory Visit (INDEPENDENT_AMBULATORY_CARE_PROVIDER_SITE_OTHER): Payer: Medicare Other | Admitting: Family Medicine

## 2022-08-12 ENCOUNTER — Encounter: Payer: Self-pay | Admitting: Family Medicine

## 2022-08-12 VITALS — BP 125/74 | HR 69 | Temp 98.5°F | Ht 72.0 in | Wt 188.4 lb

## 2022-08-12 DIAGNOSIS — I10 Essential (primary) hypertension: Secondary | ICD-10-CM | POA: Diagnosis not present

## 2022-08-12 DIAGNOSIS — R972 Elevated prostate specific antigen [PSA]: Secondary | ICD-10-CM | POA: Diagnosis not present

## 2022-08-12 DIAGNOSIS — E782 Mixed hyperlipidemia: Secondary | ICD-10-CM

## 2022-08-12 NOTE — Progress Notes (Signed)
Subjective: CC: Hypertension with hyperlipidemia PCP: [name], DO ZDG:[id number] Ralph Stevens is a 71 y.o. male presenting to clinic today for:  1.  Hypertension associate with hyperlipidemia Patient is compliant with medications.  No chest pain, shortness of breath, dizziness.  2.  History of elevated prostate level Patient saw urology but did not have PSA collected at last visit so he would like to get that done with his fasting labs today.   ROS: Per HPI  Allergies  Allergen Reactions   Gabapentin Hives   Past Medical History:  Diagnosis Date   Acid reflux    Arthritis    Arthritis    Cataract    Mixed form OU   Enlarged prostate    PSA goes up and down    GERD (gastroesophageal reflux disease)    High cholesterol    Hyperlipidemia    Hypertension    Hypertensive retinopathy    OU    Current Outpatient Medications:    alfuzosin (UROXATRAL) 10 MG 24 hr tablet, Take 1 tablet (10 mg total) by mouth every evening., Disp: 90 tablet, Rfl: 3   amLODipine (NORVASC) 5 MG tablet, Take 1 tablet (5 mg total) by mouth daily., Disp: 90 tablet, Rfl: 3   aspirin 81 MG chewable tablet, Chew 81 mg by mouth in the morning. , Disp: , Rfl:    escitalopram (LEXAPRO) 10 MG tablet, Take 1 tablet (10 mg total) by mouth daily., Disp: 90 tablet, Rfl: 3   fluticasone (FLONASE) 50 MCG/ACT nasal spray, Place 1-2 sprays into both nostrils daily as needed for allergies or rhinitis., Disp: 48 mL, Rfl: 3   ibuprofen (ADVIL) 200 MG tablet, Take 600 mg by mouth every 6 (six) hours as needed for headache or moderate pain., Disp: , Rfl:    lisinopril (ZESTRIL) 40 MG tablet, Take 1 tablet (40 mg total) by mouth daily., Disp: 90 tablet, Rfl: 3   metoprolol tartrate (LOPRESSOR) 50 MG tablet, Take 1 tablet (50 mg total) by mouth 2 (two) times daily., Disp: 180 tablet, Rfl: 3   rosuvastatin (CRESTOR) 20 MG tablet, Take 1 tablet (20 mg total) by mouth daily. 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.  He currently is unemployed but is actively looking for work as he enjoys being busy.   He recently relocated from [place] and resides locally with his wife.  He has a son that lives in [place] but his other 2 daughters are residing in [place].   Social Determinants of Health   Financial Resource Strain: Low Risk  (09/22/2021)   Overall Financial Resource Strain (CARDIA)    Difficulty of Paying Living Expenses: Not very hard  Food Insecurity: No Food Insecurity (09/22/2021)   Hunger Vital Sign    Worried About Running Out  of Food in the Last Year: Never true    [place] in the Last Year: Never true  Transportation Needs: No Transportation Needs (09/22/2021)   PRAPARE - Hydrologist (Medical): No    Lack of Transportation (Non-Medical): No  Physical Activity: Sufficiently Active (09/22/2021)   Exercise Vital Sign    Days of Exercise per Week: 7 days    Minutes of Exercise per Session: 30 min  Stress: No Stress Concern Present (09/22/2021)   [place]    Feeling of Stress : Not at all  Social Connections: Moderately Isolated (09/22/2021)   Social Connection and Isolation Panel [NHANES]    Frequency of Communication with Friends and Family: More than three times a week    Frequency of Social Gatherings with Friends  and Family: More than three times a week    Attends Religious Services: Never    Marine scientist or Organizations: No    Attends Archivist Meetings: Never    Marital Status: Married  Human resources officer Violence: Not At Risk (09/22/2021)   Humiliation, Afraid, Rape, and Kick questionnaire    Fear of Current or Ex-Partner: No    Emotionally Abused: No    Physically Abused: No    Sexually Abused: No   Family History  Problem Relation Age of Onset   Cancer Mother    Colon cancer Mother    Dementia Mother    Alzheimer's disease Mother    Emphysema Father    Hypothyroidism Sister    Thyroid disease Sister    Hypertension Sister    Multiple sclerosis Daughter    Heart defect Daughter    Colon cancer Maternal Grandmother    Heart disease Maternal Grandfather    Heart disease Paternal Grandmother    Cancer Paternal Grandfather        unknown ( mouth/throat)   Colon cancer Maternal Uncle     Objective: Office vital signs reviewed. BP 125/74   Pulse 69   Temp 98.5 F (36.9 C)   Ht 6' (1.829 m)   Wt 188 lb 6.4 oz (85.5 kg)   SpO2 96%   BMI 25.55 kg/m   Physical Examination:  General: Awake, alert, well nourished, No acute distress HEENT: sclera white, MMM Cardio: regular rate and rhythm, S1S2 heard, no murmurs appreciated Pulm: clear to auscultation bilaterally, no wheezes, rhonchi or rales; normal work of breathing on room air    Assessment/ Plan: 71 y.o. male   Essential hypertension - Plan: CMP14+EGFR, Lipid Panel  Mixed hyperlipidemia - Plan: CMP14+EGFR, Lipid Panel  Elevated PSA - Plan: PSA  Blood pressure well-controlled.  Check fasting labs.  Check PSA.  Will CC to urology.  No orders of the defined types were placed in this encounter.  No orders of the defined types were placed in this encounter.    [name], DO [place] [phone]

## 2022-08-13 LAB — CMP14+EGFR
ALT: 13 IU/L (ref 0–44)
AST: 14 IU/L (ref 0–40)
Albumin/Globulin Ratio: 1.7 (ref 1.2–2.2)
Albumin: 4.2 g/dL (ref 3.9–4.9)
Alkaline Phosphatase: 72 IU/L (ref 44–121)
BUN/Creatinine Ratio: 13 (ref 10–24)
BUN: 14 mg/dL (ref 8–27)
Bilirubin Total: 0.7 mg/dL (ref 0.0–1.2)
CO2: 21 mmol/L (ref 20–29)
Calcium: 9.1 mg/dL (ref 8.6–10.2)
Chloride: 106 mmol/L (ref 96–106)
Creatinine, Ser: 1.07 mg/dL (ref 0.76–1.27)
Globulin, Total: 2.5 g/dL (ref 1.5–4.5)
Glucose: 106 mg/dL — ABNORMAL HIGH (ref 70–99)
Potassium: 3.8 mmol/L (ref 3.5–5.2)
Sodium: 143 mmol/L (ref 134–144)
Total Protein: 6.7 g/dL (ref 6.0–8.5)
eGFR: 75 mL/min/{1.73_m2} (ref >59)

## 2022-08-13 LAB — LIPID PANEL
Chol/HDL Ratio: 4 ratio (ref 0.0–5.0)
Cholesterol, Total: 131 mg/dL (ref 100–199)
HDL: 33 mg/dL — ABNORMAL LOW (ref >39)
LDL Chol Calc (NIH): 72 mg/dL (ref 0–99)
Triglycerides: 150 mg/dL — ABNORMAL HIGH (ref 0–149)
VLDL Cholesterol Cal: 26 mg/dL (ref 5–40)

## 2022-08-13 LAB — PSA: Prostate Specific Ag, Serum: 3 ng/mL (ref 0.0–4.0)

## 2022-08-18 ENCOUNTER — Encounter: Payer: Medicare Other | Admitting: Gastroenterology

## 2022-08-23 ENCOUNTER — Ambulatory Visit (INDEPENDENT_AMBULATORY_CARE_PROVIDER_SITE_OTHER): Payer: Medicare Other | Admitting: Gastroenterology

## 2022-08-23 ENCOUNTER — Encounter: Payer: Self-pay | Admitting: Gastroenterology

## 2022-08-23 VITALS — BP 158/83 | HR 71 | Temp 97.7°F | Ht 72.0 in | Wt 190.0 lb

## 2022-08-23 DIAGNOSIS — K642 Third degree hemorrhoids: Secondary | ICD-10-CM

## 2022-08-23 NOTE — Patient Instructions (Signed)
  Please avoid straining.  You should limit your toilet time to 2-3 minutes at the most.   I recommend Benefiber 2 teaspoons each morning in the beverage of your choice!  Please call me with any concerns or issues!  I will see you in follow-up for additional banding in several weeks.  I enjoyed seeing you again today! As you know, I value our relationship and want to provide genuine, compassionate, and quality care. I welcome your feedback. If you receive a survey regarding your visit,  I greatly appreciate you taking time to fill this out. See you next time!  Rianne Degraaf W. Murat Rideout, PhD, ANP-BC [facility]         

## 2022-08-23 NOTE — Progress Notes (Signed)
      [place] BANDING PROCEDURE NOTE  Ralph Stevens is a 72 y.o. male presenting today for consideration of hemorrhoid banding. Last colonoscopy Nov 2022. History of symptomatic hemorrhoids, undergoing banding of right posterior, left lateral X2, right anterior, and then neutrally in Nov 2023. He has had flaring again of symptoms including prolapse, bleeding, and leaking/soiling. He had done well after prior banding in 2022.    The patient presents with symptomatic grade 3 hemorrhoids, unresponsive to maximal medical therapy, requesting rubber band ligation of his hemorrhoidal disease. All risks, benefits, and alternative forms of therapy were described and informed consent was obtained.  In the left lateral decubitus position, anoscopic examination revealed grade 3 hemorrhoids in the right anterior and right posterior position (s). Enlarged non-thrombosed external hemorrhoids noted.   The decision was made neutrally, and the [place] was used to perform band ligation without complication. Digital anorectal examination was then performed to assure proper positioning of the band, and to adjust the banded tissue as required. Appears again in the right anterior position. The patient was discharged home without pain or other issues. Dietary and behavioral recommendations were given, along with follow-up instructions. The patient will return in several weeks for followup and possible additional banding as required.   No complications were encountered and the patient tolerated the procedure well.   Annitta Needs, PhD, ANP-BC [facility]

## 2022-09-15 ENCOUNTER — Encounter: Payer: Self-pay | Admitting: Gastroenterology

## 2022-09-15 ENCOUNTER — Ambulatory Visit: Payer: Medicare Other | Admitting: Gastroenterology

## 2022-09-15 VITALS — BP 151/87 | HR 66 | Temp 97.5°F | Ht 72.0 in | Wt 192.8 lb

## 2022-09-15 DIAGNOSIS — K642 Third degree hemorrhoids: Secondary | ICD-10-CM | POA: Diagnosis not present

## 2022-09-15 NOTE — Patient Instructions (Signed)
  Please avoid straining.  You should limit your toilet time to 2-3 minutes at the most.   I recommend Benefiber 2 teaspoons each morning in the beverage of your choice!  Please call me with any concerns or issues!  We will see you back as needed!  I enjoyed seeing you again today! At our first visit, I mentioned how I value our relationship and want to provide genuine, compassionate, and quality care. You may receive a survey regarding your visit with me, and I welcome your feedback! Thanks so much for taking the time to complete this. I look forward to seeing you again.   Annitta Needs, PhD, ANP-BC [facility]

## 2022-09-15 NOTE — Progress Notes (Signed)
    [place] BANDING PROCEDURE NOTE  Ralph Stevens is a 72 y.o. male presenting today for consideration of hemorrhoid banding. Last colonoscopy  Nov 2022. History of symptomatic hemorrhoids, undergoing banding of right posterior, left lateral X2, right anterior, and then neutrally in Nov 2023. He has had flaring again of symptoms including prolapse, bleeding, and leaking/soiling. He had done well after prior banding in 2022.  He had Grade 3 hemorrhoids in right anterior and posterior position on anoscopy at last visit. We banded neutrally, which appeared to be right anterior. He has come for one additional banding.    The patient presents with symptomatic grade 3 hemorrhoids, unresponsive to maximal medical therapy, requesting rubber band ligation of his hemorrhoidal disease. All risks, benefits, and alternative forms of therapy were described and informed consent was obtained.   The decision was made to band the neutrally, and the Gratz was used to perform band ligation without complication. Digital anorectal examination was then performed to assure proper positioning of the band, and to adjust the banded tissue as required. Appears again to be right anterior. The patient was discharged home without pain or other issues. Dietary and behavioral recommendations were given, along with follow-up instructions. The patient will return as needed in follow-up,.    No complications were encountered and the patient tolerated the procedure well.   Annitta Needs, PhD, ANP-BC [facility]

## 2022-09-23 ENCOUNTER — Ambulatory Visit (INDEPENDENT_AMBULATORY_CARE_PROVIDER_SITE_OTHER): Payer: Medicare Other

## 2022-09-23 VITALS — Ht 72.0 in | Wt 192.0 lb

## 2022-09-23 DIAGNOSIS — Z0000 Encounter for general adult medical examination without abnormal findings: Secondary | ICD-10-CM

## 2022-09-23 NOTE — Progress Notes (Signed)
Subjective:   Ralph Stevens is a 72 y.o. male who presents for Medicare Annual/Subsequent preventive examination. I connected with  Bayley Hurn on 09/23/22 by a audio enabled telemedicine application and verified that I am speaking with the correct person using two identifiers.  Patient Location: Home  Provider Location: Home Office  I discussed the limitations of evaluation and management by telemedicine. The patient expressed understanding and agreed to proceed.  Review of Systems     Cardiac Risk Factors include: advanced age (>56mn, >>11women);male gender;hypertension     Objective:    Today's Vitals   09/23/22 0918  Weight: 192 lb (87.1 kg)  Height: 6' (1.829 m)   Body mass index is 26.04 kg/m.     09/23/2022    9:22 AM 09/22/2021   10:40 AM 06/23/2021    9:25 AM 09/21/2020   10:29 AM 01/01/2020    5:39 PM 09/20/2019   11:28 AM  Advanced Directives  Does Patient Have a Medical Advance Directive? No No No No No No  Would patient like information on creating a medical advance directive? No - Patient declined Yes (MAU/Ambulatory/Procedural Areas - Information given) No - Patient declined No - Patient declined No - Patient declined No - Patient declined    Current Medications (verified) Outpatient Encounter Medications as of 09/23/2022  Medication Sig   alfuzosin (UROXATRAL) 10 MG 24 hr tablet Take 1 tablet (10 mg total) by mouth every evening.   amLODipine (NORVASC) 5 MG tablet Take 1 tablet (5 mg total) by mouth daily.   escitalopram (LEXAPRO) 10 MG tablet Take 1 tablet (10 mg total) by mouth daily.   fluticasone (FLONASE) 50 MCG/ACT nasal spray Place 1-2 sprays into both nostrils daily as needed for allergies or rhinitis.   ibuprofen (ADVIL) 200 MG tablet Take 600 mg by mouth every 6 (six) hours as needed for headache or moderate pain.   lisinopril (ZESTRIL) 40 MG tablet Take 1 tablet (40 mg total) by mouth daily.   metoprolol tartrate (LOPRESSOR) 50 MG  tablet Take 1 tablet (50 mg total) by mouth 2 (two) times daily.   rosuvastatin (CRESTOR) 20 MG tablet Take 1 tablet (20 mg total) by mouth daily. New dose!   No facility-administered encounter medications on file as of 09/23/2022.    Allergies (verified) Gabapentin   History: Past Medical History:  Diagnosis Date   Acid reflux    Arthritis    Arthritis    Cataract    Mixed form OU   Enlarged prostate    PSA goes up and down    GERD (gastroesophageal reflux disease)    High cholesterol    Hyperlipidemia    Hypertension    Hypertensive retinopathy    OU   Past Surgical History:  Procedure Laterality Date   colonoscopy  2019   internal hemorrhoids and pancolonic diverticulosis. Surveillance due in 2024 due to history of adenomatous colon polyps and family history.   COLONOSCOPY N/A 06/23/2021   Hemorrhoids on perianal exam. 2 small 4 hemorrhoid tags, minimal internla hemorrhoids, scars appropriately located. Scattered medium-mouthed diverticula in entire colon.   EYE SURGERY     TONSILLECTOMY     age 72   Family History  Problem Relation Age of Onset   Cancer Mother    Colon cancer Mother    Dementia Mother    Alzheimer's disease Mother    Emphysema Father    Hypothyroidism Sister    Thyroid disease Sister  Hypertension Sister    Multiple sclerosis Daughter    Heart defect Daughter    Colon cancer Maternal Grandmother    Heart disease Maternal Grandfather    Heart disease Paternal Grandmother    Cancer Paternal Grandfather        unknown ( mouth/throat)   Colon cancer Maternal Uncle    Social History   Socioeconomic History   Marital status: Married    Spouse name: [name]   Number of children: 3   Years of education: Not on file   Highest education level: Not on file  Occupational History    Comment: semi-retired   Tobacco Use   Smoking status: Never   Smokeless tobacco: Never  Vaping Use   Vaping Use: Never used  Substance and Sexual Activity    Alcohol use: Never   Drug use: Never   Sexual activity: Not Currently  Other Topics Concern   Not on file  Social History Narrative   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  He currently is unemployed but is actively looking for work as he enjoys being busy.   He recently relocated from [place] and resides locally with his wife.  He has a son that lives in [place] but his other 2 daughters are residing in [place].   Social Determinants of Health   Financial Resource Strain: Low Risk  (09/23/2022)   Overall Financial Resource Strain (CARDIA)    Difficulty of Paying Living Expenses: Not hard at all  Food Insecurity: No Food Insecurity (09/23/2022)   Hunger Vital Sign    Worried About Running Out of Food in the Last Year: Never true    Ran Out of Food in the Last Year: Never true  Transportation Needs: No Transportation Needs (09/23/2022)   PRAPARE - Hydrologist (Medical): No    Lack of Transportation (Non-Medical): No  Physical Activity: Insufficiently Active (09/23/2022)   Exercise Vital Sign    Days of Exercise per Week: 3 days    Minutes of Exercise per Session: 30 min  Stress: No Stress Concern Present (09/23/2022)   [place]    Feeling of Stress : Not at all  Social Connections: Moderately Isolated (09/23/2022)   Social Connection and Isolation Panel [NHANES]    Frequency of Communication with Friends and Family: More than three times a week    Frequency of Social Gatherings with Friends and Family: More than three times a week    Attends Religious Services: Never    Marine scientist or Organizations: No    Attends Music therapist: Never    Marital Status: Married    Tobacco Counseling Counseling given: Not Answered   Clinical Intake:  Pre-visit preparation completed: Yes  Pain : No/denies pain     Nutritional  Risks: None Diabetes: No  How often do you need to have someone help you when you read instructions, pamphlets, or other written materials from your doctor or pharmacy?: 1 - Never  Diabetic?no   Interpreter Needed?: No  Information entered by :: Jadene Pierini, LPN   Activities of Daily Living    09/23/2022    9:22 AM  In your present state of health, do you have any difficulty performing the following activities:  Hearing? 0  Vision? 0  Difficulty concentrating or making decisions? 0  Walking or climbing stairs? 0  Dressing or  bathing? 0  Doing errands, shopping? 0  Preparing Food and eating ? N  Using the Toilet? N  In the past six months, have you accidently leaked urine? N  Do you have problems with loss of bowel control? N  Managing your Medications? N  Managing your Finances? N  Housekeeping or managing your Housekeeping? N    Patient Care Team: [name], DO as PCP - General (Family Medicine) Franchot Gallo, MD as Consulting Physician (Urology) Bernarda Caffey, MD as Consulting Physician (Ophthalmology) Allyn Kenner, MD (Dermatology) Gala Romney Cristopher Estimable, MD as Consulting Physician (Gastroenterology)  Indicate any recent Medical Services you may have received from other than [facility] providers in the past year (date may be approximate).     Assessment:   This is a routine wellness examination for Oma.  Hearing/Vision screen Vision Screening - Comments:: Wears rx glasses - up to date with routine eye exams with  Dr.Zamora  Dietary issues and exercise activities discussed: Current Exercise Habits: Home exercise routine, Type of exercise: walking, Time (Minutes): 30, Frequency (Times/Week): 3, Weekly Exercise (Minutes/Week): 90, Exercise limited by: None identified   Goals Addressed             This Visit's Progress    Patient Stated   On track    09/22/2021 AWV Goal: Fall Prevention  Over the next year, patient will decrease their risk for falls  by: Using assistive devices, such as a cane or walker, as needed Identifying fall risks within their home and correcting them by: Removing throw rugs Adding handrails to stairs or ramps Removing clutter and keeping a clear pathway throughout the home Increasing light, especially at night Adding shower handles/bars Raising toilet seat Identifying potential personal risk factors for falls: Medication side effects Incontinence/urgency Vestibular dysfunction Hearing loss Musculoskeletal disorders Neurological disorders Orthostatic hypotension         Depression Screen    09/23/2022    9:21 AM 08/12/2022    9:11 AM 07/08/2022    9:09 AM 03/29/2022    9:07 AM 12/22/2021    8:45 AM 09/28/2021   10:26 AM 09/22/2021   10:36 AM  PHQ 2/9 Scores  PHQ - 2 Score 0 0 1 0 0 0 0  PHQ- 9 Score 0 '2 3 3       '$ Fall Risk    09/23/2022    9:20 AM 08/12/2022    9:11 AM 07/08/2022    9:09 AM 03/29/2022    9:07 AM 12/22/2021    8:45 AM  [place] in the past year? 0 0 0 0 0  Number falls in past yr: 0      Injury with Fall? 0      Risk for fall due to : No Fall Risks      Follow up Falls prevention discussed        [place]:  Any stairs in or around the home? Yes  If so, are there any without handrails? No  Home free of loose throw rugs in walkways, pet beds, electrical cords, etc? Yes  Adequate lighting in your home to reduce risk of falls? Yes   ASSISTIVE DEVICES UTILIZED TO PREVENT FALLS:  Life alert? No  Use of a cane, walker or w/c? No  Grab bars in the bathroom? Yes  Shower chair or bench in shower? Yes  Elevated toilet seat or a handicapped toilet? Yes  09/23/2022    9:23 AM 09/21/2020   10:31 AM 09/20/2019   11:31 AM  6CIT Screen  What Year? 0 points 0 points 0 points  What month? 0 points 0 points 0 points  What time? 0 points 0 points 0 points  Count back from 20 0 points 0 points 0 points  Months in reverse 0 points 0  points 0 points  Repeat phrase 0 points 0 points 2 points  Total Score 0 points 0 points 2 points    Immunizations Immunization History  Administered Date(s) Administered   Moderna Sars-Covid-2 Vaccination 10/30/2019, 11/27/2019, 08/18/2020   Zoster Recombinat (Shingrix) 09/06/2021, 12/22/2021    TDAP status: Due, Education has been provided regarding the importance of this vaccine. Advised may receive this vaccine at local pharmacy or Health Dept. Aware to provide a copy of the vaccination record if obtained from local pharmacy or Health Dept. Verbalized acceptance and understanding.  Flu Vaccine status: Declined, Education has been provided regarding the importance of this vaccine but patient still declined. Advised may receive this vaccine at local pharmacy or Health Dept. Aware to provide a copy of the vaccination record if obtained from local pharmacy or Health Dept. Verbalized acceptance and understanding.  Pneumococcal vaccine status: Declined,  Education has been provided regarding the importance of this vaccine but patient still declined. Advised may receive this vaccine at local pharmacy or Health Dept. Aware to provide a copy of the vaccination record if obtained from local pharmacy or Health Dept. Verbalized acceptance and understanding.   Covid-19 vaccine status: Completed vaccines  Qualifies for Shingles Vaccine? Yes   Zostavax completed Yes   Shingrix Completed?: Yes  Screening Tests Health Maintenance  Topic Date Due   DTaP/Tdap/Td (1 - Tdap) Never done   COVID-19 Vaccine (4 - 2023-24 season) 04/22/2022   INFLUENZA VACCINE  11/20/2022 (Originally 03/22/2022)   Hepatitis C Screening  12/23/2022 (Originally 11/05/1968)   Pneumonia Vaccine 26+ Years old (1 - PCV) 07/09/2023 (Originally 11/05/2016)   Medicare Annual Wellness (AWV)  09/24/2023   COLONOSCOPY (Pts 45-68yr Insurance coverage will need to be confirmed)  06/24/2031   Zoster Vaccines- Shingrix  Completed   HPV  VACCINES  Aged Out    Health Maintenance  Health Maintenance Due  Topic Date Due   DTaP/Tdap/Td (1 - Tdap) Never done   COVID-19 Vaccine (4 - 2023-24 season) 04/22/2022    Colorectal cancer screening: Type of screening: Colonoscopy. Completed 06/23/2021. Repeat every 3 years  Lung Cancer Screening: (Low Dose CT Chest recommended if Age 71-80years, 30 pack-year currently smoking OR have quit w/in 15years.) does not qualify.   Lung Cancer Screening Referral: n/a  Additional Screening:  Hepatitis C Screening: does not qualify;   Vision Screening: Recommended annual ophthalmology exams for early detection of glaucoma and other disorders of the eye. Is the patient up to date with their annual eye exam?  Yes  Who is the provider or what is the name of the office in which the patient attends annual eye exams? Dr.Zamora  If pt is not established with a provider, would they like to be referred to a provider to establish care? No .   Dental Screening: Recommended annual dental exams for proper oral hygiene  Community Resource Referral / Chronic Care Management: CRR required this visit?  No   CCM required this visit?  No      Plan:     I have personally reviewed and noted the following in the patient's chart:  Medical and social history Use of alcohol, tobacco or illicit drugs  Current medications and supplements including opioid prescriptions. Patient is not currently taking opioid prescriptions. Functional ability and status Nutritional status Physical activity Advanced directives List of other physicians Hospitalizations, surgeries, and ER visits in previous 12 months Vitals Screenings to include cognitive, depression, and falls Referrals and appointments  In addition, I have reviewed and discussed with patient certain preventive protocols, quality metrics, and best practice recommendations. A written personalized care plan for preventive services as well as general  preventive health recommendations were provided to patient.     Daphane Shepherd, LPN   10/23/1959   Nurse Notes: Due TDAP Malena Peer Vaccines

## 2022-10-20 ENCOUNTER — Encounter: Payer: Self-pay | Admitting: Radiology

## 2022-12-14 ENCOUNTER — Encounter: Payer: Self-pay | Admitting: Orthopedic Surgery

## 2022-12-14 ENCOUNTER — Ambulatory Visit: Payer: Medicare Other | Admitting: Orthopedic Surgery

## 2022-12-14 VITALS — BP 171/94 | HR 87 | Ht 72.0 in | Wt 201.0 lb

## 2022-12-14 DIAGNOSIS — M7122 Synovial cyst of popliteal space [Baker], left knee: Secondary | ICD-10-CM

## 2022-12-14 DIAGNOSIS — G8929 Other chronic pain: Secondary | ICD-10-CM | POA: Diagnosis not present

## 2022-12-14 DIAGNOSIS — M25562 Pain in left knee: Secondary | ICD-10-CM

## 2022-12-14 MED ORDER — METHYLPREDNISOLONE ACETATE 40 MG/ML IJ SUSP
40.0000 mg | Freq: Once | INTRAMUSCULAR | Status: AC
  Administered 2022-12-14: 40 mg via INTRA_ARTICULAR

## 2022-12-14 NOTE — Addendum Note (Signed)
Addended byCaffie Damme on: 12/14/2022 09:45 AM   Modules accepted: Orders

## 2022-12-14 NOTE — Patient Instructions (Signed)

## 2022-12-14 NOTE — Progress Notes (Signed)
Orthopaedic Clinic Return  Assessment: Ralph Stevens is a 72 y.o. 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   Plan: [name] has pain in the left knee.  Pain in the knee joint is keeping him up at night.  He does have some degenerative changes, as noted on recent x-rays.  He also has a Baker's cyst, which causes some pain, especially with flexion.  At this point, I recommended an injection of the joint, and we will see how well this improves his pain.  If he continues to have difficulty in the posterior aspect of his knee, we can drain the cyst, but I would like to wait at least a month.  This was discussed with him, and he states his understanding.  Procedure note injection Left knee joint   Verbal consent was obtained to inject the left knee joint  Timeout was completed to confirm the site of injection.  The skin was prepped with alcohol and ethyl chloride was sprayed at the injection site.  A 21-gauge needle was used to inject 40 mg of Depo-Medrol and 1% lidocaine (3 cc) into the left knee using an anterolateral approach.  There were no complications. A sterile bandage was applied.    Follow-up: Return if symptoms worsen or fail to improve.   Subjective:  Chief Complaint  Patient presents with   Knee Pain    L > R gotten worse in the past 2 mos. Burning sensation that's worse when knee is bent, using stairs, or sitting.     History of Present Illness: Ralph Stevens is a 72 y.o. male who returns to clinic for repeat evaluation of pain.  I saw him in clinic over a year ago.  At that point, he was primarily having pain in the back of his knee.  Nothing was done at that time, except for some medications as needed.  Over the past couple months, he notes that he has worsening pain in the left knee.  Pain is in the joint, as well as the posterior aspect of his knee.  He states that the pain in the knee keeps  him up at night, and radiates proximally and distally.  Review of Systems: No fevers or chills No numbness or tingling No chest pain No shortness of breath No bowel or bladder dysfunction No GI distress No headaches   Objective: BP (!) 171/94   Pulse 87   Ht 6' (1.829 m)   Wt 201 lb (91.2 kg)   BMI 27.26 kg/m   Physical Exam:  Third and oriented.  No acute distress.  Mild knee effusion he has good range of motion.  There is a cyst in the posterior aspect of the knee.  Tenderness to palpation on the medial lateral joint line.  Positive crepitus with range of motion testing.  No increased laxity to varus or valgus stress.  Negative Lachman.  IMAGING: I personally ordered and reviewed the following images: No new imaging obtained today.  Oliver Barre, MD 12/14/2022 9:28 AM

## 2022-12-22 NOTE — Progress Notes (Signed)
[facility] - Clinic Note  12/23/2022     CHIEF COMPLAINT Patient presents for Retina Follow Up    HISTORY OF PRESENT ILLNESS: Ralph Stevens is a 72 y.o. male who presents to the clinic today for:  HPI     Retina Follow Up   Patient presents with  Other.  In left eye.  Severity is moderate.  Duration of 7 months.  Since onset it is stable.  I, the attending physician,  performed the HPI with the patient and updated documentation appropriately.        Comments   Pt here for 7 mo ret f/u ERM OS. Pt does have new rx specs. Feels VA is the same, no major changes.       Last edited by Rennis Chris, MD on 12/26/2022 12:55 AM.     Pt states vision is the same, he got new glasses about a month ago from [facility] in [place]   Referring physician: Raliegh Ip, DO [address] [place],  [place] [address]  HISTORICAL INFORMATION:   Selected notes from the MEDICAL RECORD NUMBER Referred by PCP (Dr. Nadine Counts) for history of retinal tear OS   CURRENT MEDICATIONS: No current outpatient medications on file. (Ophthalmic Drugs)   No current facility-administered medications for this visit. (Ophthalmic Drugs)   Current Outpatient Medications (Other)  Medication Sig   alfuzosin (UROXATRAL) 10 MG 24 hr tablet Take 1 tablet (10 mg total) by mouth every evening.   amLODipine (NORVASC) 5 MG tablet Take 1 tablet (5 mg total) by mouth daily.   escitalopram (LEXAPRO) 10 MG tablet Take 1 tablet (10 mg total) by mouth daily.   fluticasone (FLONASE) 50 MCG/ACT nasal spray Place 1-2 sprays into both nostrils daily as needed for allergies or rhinitis.   ibuprofen (ADVIL) 200 MG tablet Take 600 mg by mouth every 6 (six) hours as needed for headache or moderate pain.   lisinopril (ZESTRIL) 40 MG tablet Take 1 tablet (40 mg total) by mouth daily.   metoprolol tartrate (LOPRESSOR) 50 MG tablet Take 1 tablet (50 mg total) by mouth 2 (two) times daily.   rosuvastatin  (CRESTOR) 20 MG tablet Take 1 tablet (20 mg total) by mouth daily. New dose!   No current facility-administered medications for this visit. (Other)   REVIEW OF SYSTEMS: ROS   Positive for: Gastrointestinal, Skin, Musculoskeletal, Eyes Negative for: Constitutional, Neurological, Genitourinary, HENT, Endocrine, Cardiovascular, Respiratory, Psychiatric, Allergic/Imm, Heme/Lymph Last edited by Thompson Grayer, COT on 12/23/2022 12:42 PM.      ALLERGIES Allergies  Allergen Reactions   Gabapentin Hives   PAST MEDICAL HISTORY Past Medical History:  Diagnosis Date   Acid reflux    Arthritis    Arthritis    Cataract    Mixed form OU   Enlarged prostate    PSA goes up and down    GERD (gastroesophageal reflux disease)    High cholesterol    Hyperlipidemia    Hypertension    Hypertensive retinopathy    OU   Past Surgical History:  Procedure Laterality Date   colonoscopy  2019   internal hemorrhoids and pancolonic diverticulosis. Surveillance due in 2024 due to history of adenomatous colon polyps and family history.   COLONOSCOPY N/A 06/23/2021   Hemorrhoids on perianal exam. 2 small 4 hemorrhoid tags, minimal internla hemorrhoids, scars appropriately located. Scattered medium-mouthed diverticula in entire colon.   EYE SURGERY     TONSILLECTOMY  age 68    FAMILY HISTORY Family History  Problem Relation Age of Onset   Cancer Mother    Colon cancer Mother    Dementia Mother    Alzheimer's disease Mother    Emphysema Father    Hypothyroidism Sister    Thyroid disease Sister    Hypertension Sister    Multiple sclerosis Daughter    Heart defect Daughter    Colon cancer Maternal Grandmother    Heart disease Maternal Grandfather    Heart disease Paternal Grandmother    Cancer Paternal Grandfather        unknown ( mouth/throat)   Colon cancer Maternal Uncle    SOCIAL HISTORY Social History   Tobacco Use   Smoking status: Never   Smokeless tobacco: Never  Vaping  Use   Vaping Use: Never used  Substance Use Topics   Alcohol use: Never   Drug use: Never       OPHTHALMIC EXAM:  Base Eye Exam     Visual Acuity (Snellen - Linear)       Right Left   Dist cc 20/20 -2 20/20 -1    Correction: Glasses         Tonometry (Tonopen, 12:46 PM)       Right Left   Pressure 14 16         Pupils       Pupils Dark Light Shape React APD   Right PERRL 3 2 Round Brisk None   Left PERRL 3 2 Round Brisk None         Visual Fields (Counting fingers)       Left Right    Full Full         Extraocular Movement       Right Left    Full, Ortho Full, Ortho         Neuro/Psych     Oriented x3: Yes   Mood/Affect: Normal         Dilation     Both eyes: 1.0% Mydriacyl, 2.5% Phenylephrine @ 12:47 PM           Slit Lamp and Fundus Exam     Slit Lamp Exam       Right Left   Lids/Lashes Dermatochalasis - upper lid Dermatochalasis - upper lid   Conjunctiva/Sclera White and quiet White and quiet   Cornea Trace Punctate epithelial erosions, mild Arcus Trace Punctate epithelial erosions, mild Arcus   Anterior Chamber Deep, clear, narrow temporal angle Deep and clear, no cell or pigment   Iris Round and moderately dilated Round and moderately dilated   Lens 2-3+ Nuclear sclerosis with mild brunescence, 2-3+ Cortical cataract 2-3+ Nuclear sclerosis with mild brunescence, 2-3+ Cortical cataract   Anterior Vitreous Vitreous syneresis Vitreous syneresis, clear, no vitritis          Fundus Exam       Right Left   Disc Pink and Sharp Pink and Sharp   C/D Ratio 0.4 0.3   Macula Flat, Good foveal reflex, Retinal pigment epithelial mottling, No heme or edema Flat, blunted foveal reflex, ERM with mild striae, No heme   Vessels mild attenuation, mild tortuosity attenuated, Tortuous   Periphery Attached, No heme  Attached, operculated tear at 1200 with good laser surrounding, No heme; no new RT/RD           Refraction      Wearing Rx       Sphere Cylinder Axis Add   Right -2.50 +  4.00 001 +2.50   Left -1.50 +2.75 175 +2.50            IMAGING AND PROCEDURES  Imaging and Procedures for @TODAY @  OCT, Retina - OU - Both Eyes       Right Eye Quality was good. Central Foveal Thickness: 299. Progression has been stable. Findings include normal foveal contour, no IRF, no SRF.   Left Eye Quality was good. Central Foveal Thickness: 367. Progression has been stable. Findings include no IRF, no SRF, abnormal foveal contour, epiretinal membrane, macular pucker (Persistent ERM with pucker and sharpening of foveal contour).   Notes *Images captured and stored on drive  Diagnosis / Impression:  OD: NFP, no IRF/SRF OS: Persistent ERM with pucker and sharpening of foveal contour -- no significant change from prior  Clinical management:  See below  Abbreviations: NFP - Normal foveal profile. CME - cystoid macular edema. PED - pigment epithelial detachment. IRF - intraretinal fluid. SRF - subretinal fluid. EZ - ellipsoid zone. ERM - epiretinal membrane. ORA - outer retinal atrophy. ORT - outer retinal tubulation. SRHM - subretinal hyper-reflective material            ASSESSMENT/PLAN:    ICD-10-CM   1. Epiretinal membrane (ERM) of left eye  H35.372 OCT, Retina - OU - Both Eyes    2. History of repair of retinal tear by laser photocoagulation  Z98.890     3. Essential hypertension  I10     4. Hypertensive retinopathy of both eyes  H35.033     5. Combined forms of age-related cataract of both eyes  H25.813     6. 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 Epiretinal membrane, OS  - likely related to history of RT s/p laser retinopexy (see below)  - OCT shows persistent ERM with pucker and sharpening of foveal contour -- no significant change from last year  - BCVA 2020 OU  - asymptomatic, no metamorphopsia  - no indication for surgery at this time  - monitor for now  - f/u 9-12 mos,  DFE, OCT  2. History of retinal tear OS  - operculated tear located at 1200  - s/p laser retinopexy (Dr. Garner Nash, [place], [place]. [place], [place]) -- good laser surrounding  - stable   - no new RT/RD  - monitor  3,4. Hypertensive retinopathy OU  - discussed importance of tight BP control  - monitor  5. Mixed form age related cataracts OU  - The symptoms of cataract, surgical options, and treatments and risks were discussed with patient.  - discussed diagnosis and progression  - not yet visually significant  - monitor for now  6. H/o Herpes zoster with ocular involvement OS -- stably resolved  - pt diagnosed w/ herpes zoster on 3.21.22 and completed 1g po valtrex TID x10 days  - exam shows stable improvement in periorbital vesicles in V1 dermatome (no eyelid vesicles, negative Hutchinson's sign) and periorbital edema resolved  - cornea exam shows stable improvement in mild inf KP OS -- resolved  Ophthalmic Meds Ordered this visit:  No orders of the defined types were placed in this encounter.     Return for f/u 9-12 months, ERM OS, DFE, OCT.  There are no Patient Instructions on file for this visit.  This document serves as a record of services personally performed by Karie Chimera, MD, PhD. It was created on their behalf by Berlin Hun COT, an ophthalmic technician. The creation of  this record is the provider's dictation and/or activities during the visit.    Electronically signed by: Berlin Hun COT 05.02.202412:56 AM  This document serves as a record of services personally performed by Karie Chimera, MD, PhD. It was created on their behalf by Glee Arvin. Manson Passey, OA an ophthalmic technician. The creation of this record is the provider's dictation and/or activities during the visit.    Electronically signed by: Glee Arvin. Manson Passey, [place] 05.03.2024 12:56 AM  Karie Chimera, M.D., Ph.D. Diseases & Surgery of the Retina and Vitreous [facility] 12/23/2022    I have reviewed the above documentation for accuracy and completeness, and I agree with the above. Karie Chimera, M.D., Ph.D. 12/26/22 12:57 AM   Abbreviations: M myopia (nearsighted); A astigmatism; H hyperopia (farsighted); P presbyopia; Mrx spectacle prescription;  CTL contact lenses; OD right eye; OS left eye; OU both eyes  XT exotropia; ET esotropia; PEK punctate epithelial keratitis; PEE punctate epithelial erosions; DES dry eye syndrome; MGD meibomian gland dysfunction; ATs artificial tears; PFAT's preservative free artificial tears; NSC nuclear sclerotic cataract; PSC posterior subcapsular cataract; ERM epi-retinal membrane; PVD posterior vitreous detachment; RD retinal detachment; DM diabetes mellitus; DR diabetic retinopathy; NPDR non-proliferative diabetic retinopathy; PDR proliferative diabetic retinopathy; CSME clinically significant macular edema; DME diabetic macular edema; dbh dot blot hemorrhages; CWS cotton wool spot; POAG primary open angle glaucoma; C/D cup-to-disc ratio; HVF humphrey visual field; GVF goldmann visual field; OCT optical coherence tomography; IOP intraocular pressure; BRVO Branch retinal vein occlusion; CRVO central retinal vein occlusion; CRAO central retinal artery occlusion; BRAO branch retinal artery occlusion; RT retinal tear; SB scleral buckle; PPV pars plana vitrectomy; VH Vitreous hemorrhage; PRP panretinal laser photocoagulation; IVK intravitreal kenalog; VMT vitreomacular traction; MH Macular hole;  NVD neovascularization of the disc; NVE neovascularization elsewhere; AREDS age related eye disease study; ARMD age related macular degeneration; POAG primary open angle glaucoma; EBMD epithelial/anterior basement membrane dystrophy; ACIOL anterior chamber intraocular lens; IOL intraocular lens; PCIOL posterior chamber intraocular lens; Phaco/IOL phacoemulsification with intraocular lens placement; PRK photorefractive keratectomy; LASIK laser  assisted in situ keratomileusis; HTN hypertension; DM diabetes mellitus; COPD chronic obstructive pulmonary disease

## 2022-12-23 ENCOUNTER — Encounter (INDEPENDENT_AMBULATORY_CARE_PROVIDER_SITE_OTHER): Payer: Self-pay | Admitting: Ophthalmology

## 2022-12-23 ENCOUNTER — Ambulatory Visit (INDEPENDENT_AMBULATORY_CARE_PROVIDER_SITE_OTHER): Payer: Medicare Other | Admitting: Ophthalmology

## 2022-12-23 DIAGNOSIS — I10 Essential (primary) hypertension: Secondary | ICD-10-CM | POA: Diagnosis not present

## 2022-12-23 DIAGNOSIS — Z9889 Other specified postprocedural states: Secondary | ICD-10-CM | POA: Diagnosis not present

## 2022-12-23 DIAGNOSIS — H35033 Hypertensive retinopathy, bilateral: Secondary | ICD-10-CM

## 2022-12-23 DIAGNOSIS — B0233 Zoster keratitis: Secondary | ICD-10-CM | POA: Diagnosis not present

## 2022-12-23 DIAGNOSIS — H35372 Puckering of macula, left eye: Secondary | ICD-10-CM | POA: Diagnosis not present

## 2022-12-23 DIAGNOSIS — H25813 Combined forms of age-related cataract, bilateral: Secondary | ICD-10-CM

## 2022-12-26 ENCOUNTER — Encounter (INDEPENDENT_AMBULATORY_CARE_PROVIDER_SITE_OTHER): Payer: Self-pay | Admitting: Ophthalmology

## 2023-03-07 ENCOUNTER — Other Ambulatory Visit: Payer: Self-pay | Admitting: Family Medicine

## 2023-03-07 DIAGNOSIS — I10 Essential (primary) hypertension: Secondary | ICD-10-CM

## 2023-03-08 ENCOUNTER — Ambulatory Visit: Payer: Medicare Other | Admitting: Orthopedic Surgery

## 2023-03-08 ENCOUNTER — Encounter: Payer: Self-pay | Admitting: Orthopedic Surgery

## 2023-03-08 VITALS — BP 162/96 | HR 76 | Ht 72.0 in | Wt 198.0 lb

## 2023-03-08 DIAGNOSIS — M25562 Pain in left knee: Secondary | ICD-10-CM

## 2023-03-08 DIAGNOSIS — G8929 Other chronic pain: Secondary | ICD-10-CM

## 2023-03-08 NOTE — Progress Notes (Signed)
Orthopaedic Clinic Return  Assessment: Ralph Stevens is a 72 y.o. male with the following: Left knee pain, mild to moderate degenerative changes.   Plan: Ralph Stevens has recurrence of pain in the left knee.  He had done very well for couple months.  More recently, he has been taking care of sick dog, and this has resulted in increasing pain in the left knee.  He has excellent response from the prior injection, and is interested in proceeding with another injection today.  This was completed without issues.  He will follow-up as needed.    Procedure note injection Left knee joint   Verbal consent was obtained to inject the left knee joint  Timeout was completed to confirm the site of injection.  The skin was prepped with alcohol and ethyl chloride was sprayed at the injection site.  A 21-gauge needle was used to inject 40 mg of Depo-Medrol and 1% lidocaine (3 cc) into the left knee using an anterolateral approach.  There were no complications. A sterile bandage was applied.    Follow-up: Return if symptoms worsen or fail to improve.   Subjective:  Chief Complaint  Patient presents with   Knee Pain    Left- injection worked for about 6 weeks but 2 weeks ago pain started back and it hurts the most when I am watching tv or sleeping. Walking it feels better. Feels like a clicking.    History of Present Illness: Ralph Stevens is a 72 y.o. male who returns to clinic for repeat evaluation of pain.  I saw him in clinic, almost 3 months ago.  At that time, we injected his left knee.  He did very well for about 6 weeks.  Over the last couple weeks, has been taking care of his dog, who recently had eye surgery.  As such, he has been getting onto the floor.  This is causing irritation in the left knee.  He has noted some grinding and popping sensations.  He notes aching sensations when he sits still.  He does fine when he is walking.   Review of Systems: No fevers or chills No  numbness or tingling No chest pain No shortness of breath No bowel or bladder dysfunction No GI distress No headaches   Objective: BP (!) 162/96   Pulse 76   Ht 6' (1.829 m)   Wt 198 lb (89.8 kg)   BMI 26.85 kg/m   Physical Exam:  Alert and oriented.  No acute distress.  No knee effusion he has good range of motion.  There is a cyst in the posterior aspect of the knee.  Tenderness to palpation on the medial lateral joint line.  Positive crepitus with range of motion testing.  No increased laxity to varus or valgus stress.  Negative Lachman.  IMAGING: I personally ordered and reviewed the following images: No new imaging obtained today.  Oliver Barre, MD 03/08/2023 1:49 PM

## 2023-03-08 NOTE — Patient Instructions (Addendum)
 Instructions Following Joint Injections  In clinic today, you received an injection in one of your joints (sometimes more than one).  Occasionally, you can have some pain at the injection site, this is normal.  You can place ice at the injection site, or take over-the-counter medications such as Tylenol (acetaminophen) or Advil (ibuprofen).  Please follow all directions listed on the bottle.  If your joint (knee or shoulder) becomes swollen, red or very painful, please contact the clinic for additional assistance.   Two medications were injected, including lidocaine and a steroid (often referred to as cortisone).  Lidocaine is effective almost immediately but wears off quickly.  However, the steroid can take a few days to improve your symptoms.  In some cases, it can make your pain worse for a couple of days.  Do not be concerned if this happens as it is common.  You can apply ice or take some over-the-counter medications as needed.      Knee Exercises  Ask your health care provider which exercises are safe for you. Do exercises exactly as told by your health care provider and adjust them as directed. It is normal to feel mild stretching, pulling, tightness, or discomfort as you do these exercises. Stop right away if you feel sudden pain or your pain gets worse. Do not begin these exercises until told by your health care provider.  Stretching and range-of-motion exercises These exercises warm up your muscles and joints and improve the movement and flexibility of your knee. These exercises also help to relieve pain and swelling.  Knee extension, prone Lie on your abdomen (prone position) on a bed. Place your left / right knee just beyond the edge of the surface so your knee is not on the bed. You can put a towel under your left / right thigh just above your kneecap for comfort. Relax your leg muscles and allow gravity to straighten your knee (extension). You should feel a stretch behind your left  / right knee. Hold this position for 10 seconds. Scoot up so your knee is supported between repetitions. Repeat 10 times. Complete this exercise 3-4 times per week.     Knee flexion, active Lie on your back with both legs straight. If this causes back discomfort, bend your left / right knee so your foot is flat on the floor. Slowly slide your left / right heel back toward your buttocks. Stop when you feel a gentle stretch in the front of your knee or thigh (flexion). Hold this position for 10 seconds. Slowly slide your left / right heel back to the starting position. Repeat 10 times. Complete this exercise 3-4 times per week.      Quadriceps stretch, prone Lie on your abdomen on a firm surface, such as a bed or padded floor. Bend your left / right knee and hold your ankle. If you cannot reach your ankle or pant leg, loop a belt around your foot and grab the belt instead. Gently pull your heel toward your buttocks. Your knee should not slide out to the side. You should feel a stretch in the front of your thigh and knee (quadriceps). Hold this position for 10 seconds. Repeat 10 times. Complete this exercise 3-4 times per week.      Hamstring, supine Lie on your back (supine position). Loop a belt or towel over the ball of your left / right foot. The ball of your foot is on the walking surface, right under your toes. Straighten your left /   right knee and slowly pull on the belt to raise your leg until you feel a gentle stretch behind your knee (hamstring). Do not let your knee bend while you do this. Keep your other leg flat on the floor. Hold this position for 10 seconds. Repeat 10 times. Complete this exercise 3-4 times per week.   Strengthening exercises These exercises build strength and endurance in your knee. Endurance is the ability to use your muscles for a long time, even after they get tired.  Quadriceps, isometric This exercise stretches the muscles in front of your thigh  (quadriceps) without moving your knee joint (isometric). Lie on your back with your left / right leg extended and your other knee bent. Put a rolled towel or small pillow under your knee if told by your health care provider. Slowly tense the muscles in the front of your left / right thigh. You should see your kneecap slide up toward your hip or see increased dimpling just above the knee. This motion will push the back of the knee toward the floor. For 10 seconds, hold the muscle as tight as you can without increasing your pain. Relax the muscles slowly and completely. Repeat 10 times. Complete this exercise 3-4 times per week. .     Straight leg raises This exercise stretches the muscles in front of your thigh (quadriceps) and the muscles that move your hips (hip flexors). Lie on your back with your left / right leg extended and your other knee bent. Tense the muscles in the front of your left / right thigh. You should see your kneecap slide up or see increased dimpling just above the knee. Your thigh may even shake a bit. Keep these muscles tight as you raise your leg 4-6 inches (10-15 cm) off the floor. Do not let your knee bend. Hold this position for 10 seconds. Keep these muscles tense as you lower your leg. Relax your muscles slowly and completely after each repetition. Repeat 10 times. Complete this exercise 3-4 times per week.  Hamstring, isometric Lie on your back on a firm surface. Bend your left / right knee about 30 degrees. Dig your left / right heel into the surface as if you are trying to pull it toward your buttocks. Tighten the muscles in the back of your thighs (hamstring) to "dig" as hard as you can without increasing any pain. Hold this position for 10 seconds. Release the tension gradually and allow your muscles to relax completely for __________ seconds after each repetition. Repeat 10 times. Complete this exercise 3-4 times per week.  Hamstring curls If told by your  health care provider, do this exercise while wearing ankle weights. Begin with 5 lb weights. Then increase the weight by 1 lb (0.5 kg) increments. You can also use an exercise band Lie on your abdomen with your legs straight. Bend your left / right knee as far as you can without feeling pain. Keep your hips flat against the floor. Hold this position for 10 seconds. Slowly lower your leg to the starting position. Repeat 10 times. Complete this exercise 3-4 times per week.      Squats This exercise strengthens the muscles in front of your thigh and knee (quadriceps). Stand in front of a table, with your feet and knees pointing straight ahead. You may rest your hands on the table for balance but not for support. Slowly bend your knees and lower your hips like you are going to sit in a chair. Keep   your weight over your heels, not over your toes. Keep your lower legs upright so they are parallel with the table legs. Do not let your hips go lower than your knees. Do not bend lower than told by your health care provider. If your knee pain increases, do not bend as low. Hold the squat position for 10 seconds. Slowly push with your legs to return to standing. Do not use your hands to pull yourself to standing. Repeat 10 times. Complete this exercise 3-4 times per week .     Wall slides This exercise strengthens the muscles in front of your thigh and knee (quadriceps). Lean your back against a smooth wall or door, and walk your feet out 18-24 inches (46-61 cm) from it. Place your feet hip-width apart. Slowly slide down the wall or door until your knees bend 90 degrees. Keep your knees over your heels, not over your toes. Keep your knees in line with your hips. Hold this position for 10 seconds. Repeat 10 times. Complete this exercise 3-4 times per week.      Straight leg raises This exercise strengthens the muscles that rotate the leg at the hip and move it away from your body (hip  abductors). Lie on your side with your left / right leg in the top position. Lie so your head, shoulder, knee, and hip line up. You may bend your bottom knee to help you keep your balance. Roll your hips slightly forward so your hips are stacked directly over each other and your left / right knee is facing forward. Leading with your heel, lift your top leg 4-6 inches (10-15 cm). You should feel the muscles in your outer hip lifting. Do not let your foot drift forward. Do not let your knee roll toward the ceiling. Hold this position for 10 seconds. Slowly return your leg to the starting position. Let your muscles relax completely after each repetition. Repeat 10 times. Complete this exercise 3-4 times per week.      Straight leg raises This exercise stretches the muscles that move your hips away from the front of the pelvis (hip extensors). Lie on your abdomen on a firm surface. You can put a pillow under your hips if that is more comfortable. Tense the muscles in your buttocks and lift your left / right leg about 4-6 inches (10-15 cm). Keep your knee straight as you lift your leg. Hold this position for 10 seconds. Slowly lower your leg to the starting position. Let your leg relax completely after each repetition. Repeat 10 times. Complete this exercise 3-4 times per week.  

## 2023-03-14 ENCOUNTER — Other Ambulatory Visit: Payer: Self-pay | Admitting: Family Medicine

## 2023-03-14 DIAGNOSIS — I10 Essential (primary) hypertension: Secondary | ICD-10-CM

## 2023-03-14 DIAGNOSIS — E782 Mixed hyperlipidemia: Secondary | ICD-10-CM

## 2023-04-27 NOTE — Telephone Encounter (Signed)
Mandy: please put in any slot with me. I will be out next week so it can be the following week.

## 2023-05-09 ENCOUNTER — Encounter: Payer: Self-pay | Admitting: Gastroenterology

## 2023-05-09 ENCOUNTER — Ambulatory Visit: Payer: Medicare Other | Admitting: Gastroenterology

## 2023-05-09 VITALS — BP 138/80 | HR 66 | Temp 98.1°F | Ht 72.0 in | Wt 200.4 lb

## 2023-05-09 DIAGNOSIS — K642 Third degree hemorrhoids: Secondary | ICD-10-CM

## 2023-05-09 NOTE — Progress Notes (Signed)
       [facility] BANDING PROCEDURE NOTE  Ralph Stevens is a 72 y.o. male presenting today for consideration of hemorrhoid banding. Last colonoscopy Nov 2022.Jan 2024 last seen and had neutral banding. Previous 2023 banding of right posterior, left lateral X 2, right anterior, neutrally. He has done well since Jan 2024 until 2 weeks ago with recurrent bleeding, dripping. Notes right side of rectum feels protrustion.    The patient presents with symptomatic grade 3 hemorrhoids, unresponsive to maximal medical therapy, requesting rubber band ligation of his hemorrhoidal disease. All risks, benefits, and alternative forms of therapy were described and informed consent was obtained.  In the left lateral decubitus position, anoscopic examination revealed grade 3 hemorrhoids in the right anterior and right posterior positions predominantly.   The decision was made to band the right posterior and right anterior internal hemorrhoids, and the [facility] O'Regan System was used to perform band ligations without complication. Digital anorectal examination was then performed to assure proper positioning of the bands, and to adjust the banded tissue as required. The patient was discharged home without pain or other issues. Dietary and behavioral recommendations were given, along with follow-up instructions. The patient will return in several weeks for followup and possible additional banding as required.  No complications were encountered and the patient tolerated the procedure well.   Ralph Mink, PhD, ANP-BC [facility] Gastroenter[facility]

## 2023-05-09 NOTE — Patient Instructions (Addendum)
  Please avoid straining.  You should limit your toilet time to 2-3 minutes at the most.   I recommend Benefiber 2 teaspoons each morning in the beverage of your choice!  Please call me with any concerns or issues!  I will see you in follow-up for potential additional banding in several weeks.    I enjoyed seeing you again today! I value our relationship and want to provide genuine, compassionate, and quality care. You may receive a survey regarding your visit with me, and I welcome your feedback! Thanks so much for taking the time to complete this. I look forward to seeing you again.      Gelene Mink, PhD, ANP-BC [facility] Gastroenterology

## 2023-05-23 ENCOUNTER — Other Ambulatory Visit: Payer: Self-pay | Admitting: Family Medicine

## 2023-05-23 DIAGNOSIS — Z6379 Other stressful life events affecting family and household: Secondary | ICD-10-CM

## 2023-06-06 ENCOUNTER — Encounter: Payer: Self-pay | Admitting: Gastroenterology

## 2023-06-06 ENCOUNTER — Ambulatory Visit: Payer: Medicare Other | Admitting: Gastroenterology

## 2023-06-06 VITALS — BP 134/77 | HR 73 | Temp 97.8°F | Ht 72.0 in | Wt 199.6 lb

## 2023-06-06 DIAGNOSIS — K642 Third degree hemorrhoids: Secondary | ICD-10-CM | POA: Diagnosis not present

## 2023-06-06 NOTE — Patient Instructions (Signed)
  Please avoid straining.  You should limit your toilet time to 2-3 minutes at the most.   Please call me with any concerns or issues!  Let me know how this works for you. The next step will be surgical referral.  I enjoyed seeing you again today! I value our relationship and want to provide genuine, compassionate, and quality care. You may receive a survey regarding your visit with me, and I welcome your feedback! Thanks so much for taking the time to complete this. I look forward to seeing you again.      Gelene Mink, PhD, ANP-BC [facility] Gastroenterology

## 2023-06-06 NOTE — Progress Notes (Signed)
      [facility] BANDING PROCEDURE NOTE  Ralph Stevens is a 72 y.o. male presenting today for consideration of hemorrhoid banding. Last colonoscopy Nov 2022 with hemorrhoids and pancolonic diverticulosis. Previous 2023 banding of right posterior, left lateral X 2, right anterior, neutrally. Jan 2024 had neutral banding again. Seen sept 2024 with recurrent bleeding and pressure. Anoscopy at that time with Grade 3 hemorrhoids in right anterior and posterior positions predominantly. We completed both right posterior and anterior banding at last appointment. He has noted increased bleeding today. Less pressure on right side of rectum and now feels on left side of rectum.    The patient presents with symptomatic grade 3 hemorrhoids, unresponsive to maximal medical therapy, requesting rubber band ligation of his hemorrhoidal disease. All risks, benefits, and alternative forms of therapy were described and informed consent was obtained.  In the left lateral decubitus position, anoscopic examination revealed grade 3 hemorrhoids in the left lateral position (s). External exam with large left lateral external hemorrhoids and smaller right anterior/right posterior positioned external, non-thrombosed hemorrhoids.   The decision was made to band the left lateral internal hemorrhoid, and the [facility] O'Regan System was used to perform band ligation without complication. Small amount of tissue was obtained. I then banded neutrally, with adequate tissue in left lateral column.  Digital anorectal examination was then performed to assure proper positioning of the band, and to adjust the banded tissue as required. The patient was discharged home without pain or other issues. Dietary and behavioral recommendations were given, along with follow-up instructions. The patient will send myChart message with update. I feel we have maximized banding at this point and would benefit from surgical referral.   No complications were  encountered and the patient tolerated the procedure well.   Gelene Mink, PhD, ANP-BC [facility] Gastroenterology

## 2023-07-24 NOTE — Progress Notes (Incomplete)
History of Present Illness: Ralph Stevens is here for continued f/u of BPH as well as h/o elevated PSA (he has not needed TRUS/Bx). PSA last year was 3.0.  He is on alfuzosin for BPH.  Daytime urinary pattern has been unchanged over the past year but he is waking up 2-3 times a night to urinate.  He does state that he has lower extremity edema.  He limits his sodium intake.    Past Medical History:  Diagnosis Date   Acid reflux    Arthritis    Arthritis    Cataract    Mixed form OU   Enlarged prostate    PSA goes up and down    GERD (gastroesophageal reflux disease)    High cholesterol    Hyperlipidemia    Hypertension    Hypertensive retinopathy    OU    Past Surgical History:  Procedure Laterality Date   colonoscopy  2019   internal hemorrhoids and pancolonic diverticulosis. Surveillance due in 2024 due to history of adenomatous colon polyps and family history.   COLONOSCOPY N/A 06/23/2021   Hemorrhoids on perianal exam. 2 small 4 hemorrhoid tags, minimal internla hemorrhoids, scars appropriately located. Scattered medium-mouthed diverticula in entire colon.   EYE SURGERY     TONSILLECTOMY     age 72     Home Medications:  Allergies as of 07/25/2023       Reactions   Gabapentin Hives        Medication List        Accurate as of July 24, 2023  8:28 AM. If you have any questions, ask your nurse or doctor.          alfuzosin 10 MG 24 hr tablet Commonly known as: UROXATRAL Take 1 tablet (10 mg total) by mouth every evening.   amLODipine 5 MG tablet Commonly known as: NORVASC TAKE 1 TABLET (5 MG TOTAL) BY MOUTH DAILY.   escitalopram 10 MG tablet Commonly known as: LEXAPRO TAKE 1 TABLET BY MOUTH EVERY DAY   fluticasone 50 MCG/ACT nasal spray Commonly known as: FLONASE Place 1-2 sprays into both nostrils daily as needed for allergies or rhinitis.   ibuprofen 200 MG tablet Commonly known as: ADVIL Take 600 mg by mouth every 6 (six) hours as needed for  headache or moderate pain.   lisinopril 40 MG tablet Commonly known as: ZESTRIL TAKE 1 TABLET BY MOUTH EVERY DAY   metoprolol tartrate 50 MG tablet Commonly known as: LOPRESSOR Take 1 tablet (50 mg total) by mouth 2 (two) times daily.   rosuvastatin 20 MG tablet Commonly known as: CRESTOR TAKE 1 TABLET (20 MG TOTAL) BY MOUTH DAILY. NEW DOSE!        Allergies:  Allergies  Allergen Reactions   Gabapentin Hives    Family History  Problem Relation Age of Onset   Cancer Mother    Colon cancer Mother    Dementia Mother    Alzheimer's disease Mother    Emphysema Father    Hypothyroidism Sister    Thyroid disease Sister    Hypertension Sister    Multiple sclerosis Daughter    Heart defect Daughter    Colon cancer Maternal Grandmother    Heart disease Maternal Grandfather    Heart disease Paternal Grandmother    Cancer Paternal Grandfather        unknown ( mouth/throat)   Colon cancer Maternal Uncle     Social History:  reports that he has never smoked. He has never used  smokeless tobacco. He reports that he does not drink alcohol and does not use drugs.  ROS: A complete review of systems was performed.  All systems are negative except for pertinent findings as noted.  Physical Exam:  Vital signs in last 24 hours: There were no vitals taken for this visit. Constitutional:  Alert and oriented, No acute distress Cardiovascular: Regular rate  Respiratory: Normal respiratory effort GI: There are no inguinal hernias Genitourinary: Penis is circumcised without lesions.  Scrotal skin is normal.  Testicular exam normal bilaterally.  Normal anal sphincter tone.  Several external hemorrhoids noted, nonthrombosed.  Prostate is 50 g, symmetric, no nodules, no tenderness.  There were no rectal masses. Lymphatic: No lymphadenopathy Neurologic: Grossly intact, no focal deficits Psychiatric: Normal mood and affect  I have reviewed prior pt notes  I have reviewed urinalysis  results  I have independently reviewed prior imaging--Notes from prior prostate MRI done in [place]  I have reviewed prior PSA results  I have reviewed IPSS form--12/2  Most recent CMP reviewed-creatinine 1.07   Impression/Assessment:  -History of elevated PSA, no prior biopsy, fairly stable PSA trend with normal exam today  -BPH with symptoms, stable daytime voiding pattern.  He does have nocturia which I think is more likely related to nocturnal polyuria  Plan:  -I discussed treatment of nocturia with him-limiting afternoon and evening fluids, limiting his already limited sodium diet, compression socks  -PSA is checked today  -Continue alfuzosin  -I will see back in 1 year

## 2023-07-25 ENCOUNTER — Ambulatory Visit: Payer: Medicare Other | Admitting: Urology

## 2023-07-25 ENCOUNTER — Encounter: Payer: Self-pay | Admitting: Urology

## 2023-07-25 VITALS — BP 156/83

## 2023-07-25 DIAGNOSIS — K642 Third degree hemorrhoids: Secondary | ICD-10-CM

## 2023-07-25 DIAGNOSIS — R972 Elevated prostate specific antigen [PSA]: Secondary | ICD-10-CM

## 2023-07-25 DIAGNOSIS — N401 Enlarged prostate with lower urinary tract symptoms: Secondary | ICD-10-CM | POA: Diagnosis not present

## 2023-07-25 DIAGNOSIS — Z87898 Personal history of other specified conditions: Secondary | ICD-10-CM

## 2023-07-25 DIAGNOSIS — R351 Nocturia: Secondary | ICD-10-CM

## 2023-07-25 DIAGNOSIS — R350 Frequency of micturition: Secondary | ICD-10-CM | POA: Diagnosis not present

## 2023-07-25 LAB — URINALYSIS, ROUTINE W REFLEX MICROSCOPIC
Bilirubin, UA: NEGATIVE
Glucose, UA: NEGATIVE
Ketones, UA: NEGATIVE
Leukocytes,UA: NEGATIVE
Nitrite, UA: NEGATIVE
Protein,UA: NEGATIVE
Specific Gravity, UA: 1.025 (ref 1.005–1.030)
Urobilinogen, Ur: 0.2 mg/dL (ref 0.2–1.0)
pH, UA: 6 (ref 5.0–7.5)

## 2023-07-25 LAB — MICROSCOPIC EXAMINATION
Bacteria, UA: NONE SEEN
Epithelial Cells (non renal): NONE SEEN /[HPF] (ref 0–10)
WBC, UA: NONE SEEN /[HPF] (ref 0–5)

## 2023-07-25 MED ORDER — ALFUZOSIN HCL ER 10 MG PO TB24: 10.0000 mg | ORAL_TABLET | Freq: Every evening | ORAL | 3 refills | Status: DC

## 2023-07-25 NOTE — Telephone Encounter (Signed)
Mindy/Tammy:  Please refer to surgery for hemorrhoidectomy. We have attempted banding here but maximized interventions here.

## 2023-07-25 NOTE — Telephone Encounter (Signed)
noted 

## 2023-07-25 NOTE — Addendum Note (Signed)
Addended by: Armstead Peaks on: 07/25/2023 01:49 PM   Modules accepted: Orders

## 2023-07-25 NOTE — Telephone Encounter (Signed)
Referral placed.

## 2023-07-26 LAB — PSA: Prostate Specific Ag, Serum: 3.3 ng/mL (ref 0.0–4.0)

## 2023-08-14 ENCOUNTER — Ambulatory Visit: Payer: Medicare Other | Admitting: Family Medicine

## 2023-08-14 ENCOUNTER — Encounter: Payer: Self-pay | Admitting: Family Medicine

## 2023-08-14 VITALS — BP 142/81 | HR 96 | Temp 98.3°F | Ht 72.0 in | Wt 204.0 lb

## 2023-08-14 DIAGNOSIS — Z974 Presence of external hearing-aid: Secondary | ICD-10-CM

## 2023-08-14 DIAGNOSIS — I10 Essential (primary) hypertension: Secondary | ICD-10-CM | POA: Diagnosis not present

## 2023-08-14 DIAGNOSIS — R972 Elevated prostate specific antigen [PSA]: Secondary | ICD-10-CM

## 2023-08-14 DIAGNOSIS — E782 Mixed hyperlipidemia: Secondary | ICD-10-CM | POA: Diagnosis not present

## 2023-08-14 DIAGNOSIS — Z1159 Encounter for screening for other viral diseases: Secondary | ICD-10-CM

## 2023-08-14 DIAGNOSIS — M1712 Unilateral primary osteoarthritis, left knee: Secondary | ICD-10-CM | POA: Diagnosis not present

## 2023-08-14 DIAGNOSIS — R739 Hyperglycemia, unspecified: Secondary | ICD-10-CM | POA: Diagnosis not present

## 2023-08-14 DIAGNOSIS — K649 Unspecified hemorrhoids: Secondary | ICD-10-CM

## 2023-08-14 DIAGNOSIS — H9193 Unspecified hearing loss, bilateral: Secondary | ICD-10-CM | POA: Diagnosis not present

## 2023-08-14 DIAGNOSIS — Z6379 Other stressful life events affecting family and household: Secondary | ICD-10-CM

## 2023-08-14 DIAGNOSIS — Z0000 Encounter for general adult medical examination without abnormal findings: Secondary | ICD-10-CM

## 2023-08-14 DIAGNOSIS — Z0001 Encounter for general adult medical examination with abnormal findings: Secondary | ICD-10-CM | POA: Diagnosis not present

## 2023-08-14 LAB — BAYER DCA HB A1C WAIVED: HB A1C (BAYER DCA - WAIVED): 6 % — ABNORMAL HIGH (ref 4.8–5.6)

## 2023-08-14 LAB — LIPID PANEL

## 2023-08-14 MED ORDER — ROSUVASTATIN CALCIUM 20 MG PO TABS: 20.0000 mg | ORAL_TABLET | Freq: Every day | ORAL | 3 refills | Status: DC

## 2023-08-14 MED ORDER — AMLODIPINE BESYLATE 5 MG PO TABS: 5.0000 mg | ORAL_TABLET | Freq: Every day | ORAL | 3 refills | Status: DC

## 2023-08-14 MED ORDER — LISINOPRIL 40 MG PO TABS: 40.0000 mg | ORAL_TABLET | Freq: Every day | ORAL | 3 refills | Status: DC

## 2023-08-14 MED ORDER — METOPROLOL TARTRATE 50 MG PO TABS: 50.0000 mg | ORAL_TABLET | Freq: Two times a day (BID) | ORAL | 3 refills | Status: DC

## 2023-08-14 MED ORDER — ESCITALOPRAM OXALATE 10 MG PO TABS: 10.0000 mg | ORAL_TABLET | Freq: Every day | ORAL | 3 refills | Status: DC

## 2023-08-14 NOTE — Progress Notes (Addendum)
Ralph Stevens is a 72 y.o. male presents to office today for annual physical exam examination.    Concerns today include: 1.  Left knee pain Patient reports that he has had left knee pain for quite some time.  He is currently seeing Dr. Dallas Schimke but notes that he has had 2 corticosteroid shots and these have not lasted more than 6 weeks.  He asked about viscosupplementation and/or arthroscopic surgery but was told that this would be useless and him and therefore no further interventions have been planned.  He is asking for second opinion as he continues to have some issues.  2.  Bleeding hemorrhoids Patient will be seeing general surgery soon to discuss surgical intervention as banding has not resolved bleeding hemorrhoid with GI  Occupation: retired Curator, Marital status: married to [place], Substance use: none Health Maintenance Due  Topic Date Due   Medicare Annual Wellness (AWV)  09/24/2023   Refills needed today: all  Immunization History  Administered Date(s) Administered   Moderna Sars-Covid-2 Vaccination 10/30/2019, 11/27/2019, 08/18/2020   Zoster Recombinant(Shingrix) 09/06/2021, 12/22/2021   Past Medical History:  Diagnosis Date   Acid reflux    Arthritis    Arthritis    Cataract    Mixed form OU   Enlarged prostate    PSA goes up and down    GERD (gastroesophageal reflux disease)    High cholesterol    Hyperlipidemia    Hypertension    Hypertensive retinopathy    OU   Social History   Socioeconomic History   Marital status: Married    Spouse name: Kathie Rhodes   Number of children: 3   Years of education: Not on file   Highest education level: Not on file  Occupational History    Comment: semi-retired   Tobacco Use   Smoking status: Never   Smokeless tobacco: Never  Vaping Use   Vaping status: Never Used  Substance and Sexual Activity   Alcohol use: Never   Drug use: Never   Sexual activity: Not Currently  Other Topics Concern   Not on file   Social History Narrative   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     He recently relocated from [place] and resides locally with his wife.     He has a son that lives in [place] but his other 2 daughters are residing in [place].   Social Drivers of Corporate investment banker Strain: Low Risk  (09/23/2022)   Overall Financial Resource Strain (CARDIA)    Difficulty of Paying Living Expenses: Not hard at all  Food Insecurity: No Food Insecurity (09/23/2022)   Hunger Vital Sign    Worried About Running Out of Food in the Last Year: Never true    Ran Out of Food in the Last Year: Never true  Transportation Needs: No Transportation Needs (09/23/2022)   PRAPARE - Administrator, Civil Service (Medical): No    Lack of Transportation (Non-Medical): No  Physical Activity: Insufficiently Active (09/23/2022)   Exercise Vital Sign    Days of Exercise per Week: 3 days    Minutes of Exercise per Session: 30 min  Stress: No Stress Concern Present (09/23/2022)   [employer] of Occupational Health - Occupational Stress Questionnaire    Feeling of Stress : Not at all  Social Connections: Moderately Isolated (09/23/2022)   Social Connection and Isolation Panel [NHANES]    Frequency of Communication  with Friends and Family: More than three times a week    Frequency of Social Gatherings with Friends and Family: More than three times a week    Attends Religious Services: Never    Database administrator or Organizations: No    Attends Banker Meetings: Never    Marital Status: Married  Catering manager Violence: Not At Risk (09/23/2022)   Humiliation, Afraid, Rape, and Kick questionnaire    Fear of Current or Ex-Partner: No    Emotionally Abused: No    Physically Abused: No    Sexually Abused: No   Past Surgical History:  Procedure Laterality Date   colonoscopy  2019   internal hemorrhoids and pancolonic diverticulosis.  Surveillance due in 2024 due to history of adenomatous colon polyps and family history.   COLONOSCOPY N/A 06/23/2021   Hemorrhoids on perianal exam. 2 small 4 hemorrhoid tags, minimal internla hemorrhoids, scars appropriately located. Scattered medium-mouthed diverticula in entire colon.   EYE SURGERY     TONSILLECTOMY     age 76    Family History  Problem Relation Age of Onset   Cancer Mother    Colon cancer Mother    Dementia Mother    Alzheimer's disease Mother    Emphysema Father    Hypothyroidism Sister    Thyroid disease Sister    Hypertension Sister    Multiple sclerosis Daughter    Heart defect Daughter    Colon cancer Maternal Grandmother    Heart disease Maternal Grandfather    Heart disease Paternal Grandmother    Cancer Paternal Grandfather        unknown ( mouth/throat)   Colon cancer Maternal Uncle     Current Outpatient Medications:    alfuzosin (UROXATRAL) 10 MG 24 hr tablet, Take 1 tablet (10 mg total) by mouth every evening., Disp: 90 tablet, Rfl: 3   fluticasone (FLONASE) 50 MCG/ACT nasal spray, Place 1-2 sprays into both nostrils daily as needed for allergies or rhinitis., Disp: 48 mL, Rfl: 3   ibuprofen (ADVIL) 200 MG tablet, Take 600 mg by mouth every 6 (six) hours as needed for headache or moderate pain., Disp: , Rfl:    amLODipine (NORVASC) 5 MG tablet, Take 1 tablet (5 mg total) by mouth daily., Disp: 90 tablet, Rfl: 3   escitalopram (LEXAPRO) 10 MG tablet, Take 1 tablet (10 mg total) by mouth daily., Disp: 90 tablet, Rfl: 3   lisinopril (ZESTRIL) 40 MG tablet, Take 1 tablet (40 mg total) by mouth daily., Disp: 90 tablet, Rfl: 3   metoprolol tartrate (LOPRESSOR) 50 MG tablet, Take 1 tablet (50 mg total) by mouth 2 (two) times daily., Disp: 180 tablet, Rfl: 3   rosuvastatin (CRESTOR) 20 MG tablet, Take 1 tablet (20 mg total) by mouth daily., Disp: 90 tablet, Rfl: 3  Allergies  Allergen Reactions   Gabapentin Hives     ROS: Review of  Systems Pertinent items noted in HPI and remainder of comprehensive ROS otherwise negative.    Physical exam BP (!) 142/81   Pulse 96   Temp 98.3 F (36.8 C)   Ht 6' (1.829 m)   Wt 204 lb (92.5 kg)   SpO2 95%   BMI 27.67 kg/m  General appearance: alert, cooperative, appears stated age, and no distress Head: Normocephalic, without obvious abnormality, atraumatic Eyes: negative findings: lids and lashes normal, conjunctivae and sclerae normal, corneas clear, and pupils equal, round, reactive to light and accomodation Ears: normal TM's and external ear canals  both ears Nose: Nares normal. Septum midline. Mucosa normal. No drainage or sinus tenderness. Throat: lips, mucosa, and tongue normal; teeth and gums normal Neck: no adenopathy, supple, symmetrical, trachea midline, and thyroid not enlarged, symmetric, no tenderness/mass/nodules Back: symmetric, no curvature. ROM normal. No CVA tenderness. Lungs: clear to auscultation bilaterally Chest wall: no tenderness Heart: regular rate and rhythm, S1, S2 normal, no murmur, click, rub or gallop Abdomen:  Protuberant, soft, nontender Extremities: extremities normal, atraumatic, no cyanosis or edema Pulses: 2+ and symmetric Skin: Skin color, texture, turgor normal. No rashes or lesions Lymph nodes: Cervical, supraclavicular, and axillary nodes normal. Neurologic: Grossly normal MSK: Ambulating independently.  No gross soft tissue swelling noted at the left knee     08/14/2023    9:42 AM 09/23/2022    9:21 AM 08/12/2022    9:11 AM  Depression screen PHQ 2/9  Decreased Interest 0 0 0  Down, Depressed, Hopeless 0 0 0  PHQ - 2 Score 0 0 0  Altered sleeping  0 1  Tired, decreased energy  0 1  Change in appetite  0 0  Feeling bad or failure about yourself   0 0  Trouble concentrating  0 0  Moving slowly or fidgety/restless  0 0  Suicidal thoughts  0 0  PHQ-9 Score  0 2  Difficult doing work/chores  Not difficult at all Not difficult at  all      08/14/2023    8:48 AM 08/12/2022    9:11 AM 07/08/2022    9:09 AM 03/29/2022    9:07 AM  GAD 7 : Generalized Anxiety Score  Nervous, Anxious, on Edge 0 1 1 1   Control/stop worrying 0 1 1 1   Worry too much - different things 0 1 0 1  Trouble relaxing 0 1 0 1  Restless 0 0 0 0  Easily annoyed or irritable 0 1 1 0  Afraid - awful might happen 0 0 0 0  Total GAD 7 Score 0 5 3 4   Anxiety Difficulty Not difficult at all Not difficult at all Somewhat difficult Not difficult at all     Assessment/ Plan: Stormy Fabian here for annual physical exam.   Annual physical exam  Need for hepatitis C screening test - Plan: Hepatitis C Antibody  Mixed hyperlipidemia - Plan: Lipid panel, Comprehensive metabolic panel, TSH, rosuvastatin (CRESTOR) 20 MG tablet  Essential hypertension - Plan: Comprehensive metabolic panel, amLODipine (NORVASC) 5 MG tablet, lisinopril (ZESTRIL) 40 MG tablet, metoprolol tartrate (LOPRESSOR) 50 MG tablet  Elevated serum glucose - Plan: Bayer DCA Hb A1c Waived  Elevated PSA - Plan: CBC with Differential/Platelet  Primary osteoarthritis of left knee - Plan: Ambulatory referral to Orthopedic Surgery  Bleeding hemorrhoids - Plan: CBC with Differential/Platelet  Stress due to illness of family member - Plan: escitalopram (LEXAPRO) 10 MG tablet  Wears hearing aid - Plan: Ambulatory referral to Audiology  Bilateral hearing loss, unspecified hearing loss type - Plan: Ambulatory referral to Audiology  Screening hepatitis C collected.  Fasting labs collected.  Continue statin.  Check renal function, liver enzymes and electrolytes.  Blood pressure is controlled.  No changes needed.  Medications have been renewed  A1c demonstrates prediabetes and we discussed ways to reduce sugar.  Declines referral to nutrition at this time.  I will see him back in 6 months for recheck  Check CBC.  PSA recently normal with outside provider.  I have placed a referral to  orthopedics at [facility] for second opinion  on that left knee.  He is interested in viscosupplementation if appropriate  Keep follow-up with general surgery for discussion of surgical repair of hemorrhoids  Continues to have stress surrounding the health of his wife.  I discussed with him offering her to see our counselor and or referring her to a psychiatrist today and he will let me know via MyChart if these orders need to be placed.  Lexapro renewed for now  Counseled on healthy lifestyle choices, including diet (rich in fruits, vegetables and lean meats and low in salt and simple carbohydrates) and exercise (at least 30 minutes of moderate physical activity daily).  Patient to follow up 24m for preDM recheck  Marsden Zaino M. Nadine Counts, DO

## 2023-08-14 NOTE — Addendum Note (Signed)
Addended by: Raliegh Ip on: 08/14/2023 01:59 PM   Modules accepted: Orders

## 2023-08-15 LAB — CBC WITH DIFFERENTIAL/PLATELET
Basophils Absolute: 0.1 10*3/uL (ref 0.0–0.2)
Basos: 1 %
EOS (ABSOLUTE): 0.3 10*3/uL (ref 0.0–0.4)
Eos: 4 %
Hematocrit: 41.5 % (ref 37.5–51.0)
Hemoglobin: 13.6 g/dL (ref 13.0–17.7)
Immature Grans (Abs): 0 10*3/uL (ref 0.0–0.1)
Immature Granulocytes: 0 %
Lymphocytes Absolute: 2 10*3/uL (ref 0.7–3.1)
Lymphs: 27 %
MCH: 29.1 pg (ref 26.6–33.0)
MCHC: 32.8 g/dL (ref 31.5–35.7)
MCV: 89 fL (ref 79–97)
Monocytes Absolute: 0.6 10*3/uL (ref 0.1–0.9)
Monocytes: 7 %
Neutrophils Absolute: 4.5 10*3/uL (ref 1.4–7.0)
Neutrophils: 61 %
Platelets: 236 10*3/uL (ref 150–450)
RBC: 4.68 x10E6/uL (ref 4.14–5.80)
RDW: 13.7 % (ref 11.6–15.4)
WBC: 7.4 10*3/uL (ref 3.4–10.8)

## 2023-08-15 LAB — COMPREHENSIVE METABOLIC PANEL
ALT: 13 [IU]/L (ref 0–44)
AST: 16 [IU]/L (ref 0–40)
Albumin: 3.8 g/dL (ref 3.8–4.8)
Alkaline Phosphatase: 71 [IU]/L (ref 44–121)
BUN/Creatinine Ratio: 13 (ref 10–24)
BUN: 13 mg/dL (ref 8–27)
Bilirubin Total: 0.5 mg/dL (ref 0.0–1.2)
CO2: 22 mmol/L (ref 20–29)
Calcium: 8.9 mg/dL (ref 8.6–10.2)
Chloride: 104 mmol/L (ref 96–106)
Creatinine, Ser: 0.97 mg/dL (ref 0.76–1.27)
Globulin, Total: 2.9 g/dL (ref 1.5–4.5)
Glucose: 107 mg/dL — ABNORMAL HIGH (ref 70–99)
Potassium: 3.8 mmol/L (ref 3.5–5.2)
Sodium: 142 mmol/L (ref 134–144)
Total Protein: 6.7 g/dL (ref 6.0–8.5)
eGFR: 83 mL/min/{1.73_m2} (ref >59)

## 2023-08-15 LAB — LIPID PANEL
Cholesterol, Total: 145 mg/dL (ref 100–199)
HDL: 33 mg/dL — ABNORMAL LOW (ref >39)
LDL CALC COMMENT:: 4.4 ratio (ref 0.0–5.0)
LDL Chol Calc (NIH): 90 mg/dL (ref 0–99)
Triglycerides: 124 mg/dL (ref 0–149)
VLDL Cholesterol Cal: 22 mg/dL (ref 5–40)

## 2023-08-15 LAB — HEPATITIS C ANTIBODY

## 2023-08-15 LAB — TSH: TSH: 4.7 u[IU]/mL — ABNORMAL HIGH (ref 0.450–4.500)

## 2023-08-18 ENCOUNTER — Encounter: Payer: Self-pay | Admitting: Family Medicine

## 2023-08-22 ENCOUNTER — Encounter: Payer: Self-pay | Admitting: Family Medicine

## 2023-08-22 NOTE — Telephone Encounter (Signed)
 Can you look into this?  I specifically asked for him to be sent to another orthopedist for second opinion. Not sure what is going on

## 2023-09-26 ENCOUNTER — Ambulatory Visit: Payer: Medicare Other

## 2023-09-26 VITALS — Ht 72.0 in | Wt 204.0 lb

## 2023-09-26 DIAGNOSIS — Z0000 Encounter for general adult medical examination without abnormal findings: Secondary | ICD-10-CM

## 2023-09-26 NOTE — Patient Instructions (Signed)
 [name] , Thank you for taking time to come for your Medicare Wellness Visit. I appreciate your ongoing commitment to your health goals. Please review the following plan we discussed and let me know if I can assist you in the future.   Referrals/Orders/Follow-Ups/Clinician Recommendations: Aim for 30 minutes of exercise or brisk walking, 6-8 glasses of water , and 5 servings of fruits and vegetables each day.  This is a list of the screening recommended for you and due dates:  Health Maintenance  Topic Date Due   COVID-19 Vaccine (4 - 2024-25 season) 04/23/2023   Flu Shot  11/20/2023*   DTaP/Tdap/Td vaccine (1 - Tdap) 08/13/2024*   Pneumonia Vaccine (1 of 1 - PCV) 08/13/2024*   Medicare Annual Wellness Visit  09/25/2024   Colon Cancer Screening  06/24/2031   Hepatitis C Screening  Completed   Zoster (Shingles) Vaccine  Completed   HPV Vaccine  Aged Out  *Topic was postponed. The date shown is not the original due date.    Advanced directives: (ACP Link)Information on Advanced Care Planning can be found at [place]  Secretary of [facility] Advance Health Care Directives Advance Health Care Directives ([email or web address]   Next Medicare Annual Wellness Visit scheduled for next year: Yes

## 2023-09-26 NOTE — Progress Notes (Signed)
 Subjective:   Ralph Stevens is a 73 y.o. male who presents for Medicare Annual/Subsequent preventive examination.  Visit Complete: Virtual I connected with  Ralph Stevens on 09/26/23 by a audio enabled telemedicine application and verified that I am speaking with the correct person using two identifiers.  Patient Location: Home  Provider Location: Home Office  I discussed the limitations of evaluation and management by telemedicine. The patient expressed understanding and agreed to proceed.  Vital Signs: Because this visit was a virtual/telehealth visit, some criteria may be missing or patient reported. Any vitals not documented were not able to be obtained and vitals that have been documented are patient reported.  Cardiac Risk Factors include: advanced age (>69men, >48 women);male gender;hypertension     Objective:    Today's Vitals   09/26/23 1423  Weight: 204 lb (92.5 kg)  Height: 6' (1.829 m)   Body mass index is 27.67 kg/m.     09/26/2023    2:26 PM 09/23/2022    9:22 AM 09/22/2021   10:40 AM 06/23/2021    9:25 AM 09/21/2020   10:29 AM 01/01/2020    5:39 PM 09/20/2019   11:28 AM  Advanced Directives  Does Patient Have a Medical Advance Directive? No No No No No No No  Would patient like information on creating a medical advance directive? Yes (MAU/Ambulatory/Procedural Areas - Information given) No - Patient declined Yes (MAU/Ambulatory/Procedural Areas - Information given) No - Patient declined No - Patient declined No - Patient declined No - Patient declined    Current Medications (verified) Outpatient Encounter Medications as of 09/26/2023  Medication Sig   alfuzosin  (UROXATRAL ) 10 MG 24 hr tablet Take 1 tablet (10 mg total) by mouth every evening.   amLODipine  (NORVASC ) 5 MG tablet Take 1 tablet (5 mg total) by mouth daily.   escitalopram  (LEXAPRO ) 10 MG tablet Take 1 tablet (10 mg total) by mouth daily.   fluticasone  (FLONASE ) 50 MCG/ACT nasal spray  Place 1-2 sprays into both nostrils daily as needed for allergies or rhinitis.   ibuprofen (ADVIL) 200 MG tablet Take 600 mg by mouth every 6 (six) hours as needed for headache or moderate pain.   lisinopril  (ZESTRIL ) 40 MG tablet Take 1 tablet (40 mg total) by mouth daily.   metoprolol  tartrate (LOPRESSOR ) 50 MG tablet Take 1 tablet (50 mg total) by mouth 2 (two) times daily.   rosuvastatin  (CRESTOR ) 20 MG tablet Take 1 tablet (20 mg total) by mouth daily.   No facility-administered encounter medications on file as of 09/26/2023.    Allergies (verified) Gabapentin    History: Past Medical History:  Diagnosis Date   Acid reflux    Arthritis    Arthritis    Cataract    Mixed form OU   Enlarged prostate    PSA goes up and down    GERD (gastroesophageal reflux disease)    High cholesterol    Hyperlipidemia    Hypertension    Hypertensive retinopathy    OU   Past Surgical History:  Procedure Laterality Date   colonoscopy  2019   internal hemorrhoids and pancolonic diverticulosis. Surveillance due in 2024 due to history of adenomatous colon polyps and family history.   COLONOSCOPY N/A 06/23/2021   Hemorrhoids on perianal exam. 2 small 4 hemorrhoid tags, minimal internla hemorrhoids, scars appropriately located. Scattered medium-mouthed diverticula in entire colon.   EYE SURGERY     TONSILLECTOMY     age 52    Family History  Problem Relation Age of Onset   Cancer Mother    Colon cancer Mother    Dementia Mother    Alzheimer's disease Mother    Emphysema Father    Hypothyroidism Sister    Thyroid  disease Sister    Hypertension Sister    Multiple sclerosis Daughter    Heart defect Daughter    Colon cancer Maternal Grandmother    Heart disease Maternal Grandfather    Heart disease Paternal Grandmother    Cancer Paternal Grandfather        unknown ( mouth/throat)   Colon cancer Maternal Uncle    Social History   Socioeconomic History   Marital status: Married     Spouse name: Dickey   Number of children: 3   Years of education: Not on file   Highest education level: Not on file  Occupational History    Comment: semi-retired   Tobacco Use   Smoking status: Never   Smokeless tobacco: Never  Vaping Use   Vaping status: Never Used  Substance and Sexual Activity   Alcohol use: Never   Drug use: Never   Sexual activity: Not Currently  Other Topics Concern   Not on file  Social History Narrative   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     He recently relocated from [place]  and resides locally with his wife.     He has a son that lives in [place] but his other 2 daughters are residing in [place] .   Social Drivers of Corporate Investment Banker Strain: Low Risk  (09/26/2023)   Overall Financial Resource Strain (CARDIA)    Difficulty of Paying Living Expenses: Not hard at all  Food Insecurity: No Food Insecurity (09/26/2023)   Hunger Vital Sign    Worried About Running Out of Food in the Last Year: Never true    Ran Out of Food in the Last Year: Never true  Transportation Needs: No Transportation Needs (09/26/2023)   PRAPARE - Administrator, Civil Service (Medical): No    Lack of Transportation (Non-Medical): No  Physical Activity: Insufficiently Active (09/26/2023)   Exercise Vital Sign    Days of Exercise per Week: 3 days    Minutes of Exercise per Session: 30 min  Stress: No Stress Concern Present (09/26/2023)   [employer] of Occupational Health - Occupational Stress Questionnaire    Feeling of Stress : Not at all  Social Connections: Moderately Isolated (09/26/2023)   Social Connection and Isolation Panel [NHANES]    Frequency of Communication with Friends and Family: More than three times a week    Frequency of Social Gatherings with Friends and Family: Three times a week    Attends Religious Services: Never    Active Member of Clubs or Organizations: No    Attends Museum/gallery Exhibitions Officer: Never    Marital Status: Married    Tobacco Counseling Counseling given: Not Answered   Clinical Intake:  Pre-visit preparation completed: Yes  Pain : No/denies pain     Diabetes: No  How often do you need to have someone help you when you read instructions, pamphlets, or other written materials from your doctor or pharmacy?: 1 - Never  Interpreter Needed?: No  Information entered by :: Charmaine Bloodgood LPN   Activities of Daily Living    09/26/2023    2:24 PM  In your present state of health, do you have any difficulty  performing the following activities:  Hearing? 0  Vision? 0  Difficulty concentrating or making decisions? 0  Walking or climbing stairs? 0  Dressing or bathing? 0  Doing errands, shopping? 0  Preparing Food and eating ? N  Using the Toilet? N  In the past six months, have you accidently leaked urine? N  Do you have problems with loss of bowel control? N  Managing your Medications? N  Managing your Finances? N  Housekeeping or managing your Housekeeping? N    Patient Care Team: Jolinda Norene HERO, DO as PCP - General (Family Medicine) Matilda Senior, MD as Consulting Physician (Urology) Valdemar Rogue, MD as Consulting Physician (Ophthalmology) Shona Rush, MD (Dermatology) Shaaron Lamar HERO, MD as Consulting Physician (Gastroenterology)  Indicate any recent Medical Services you may have received from other than [facility] providers in the past year (date may be approximate).     Assessment:   This is a routine wellness examination for Reed.  Hearing/Vision screen Hearing Screening - Comments:: Denies hearing difficulties   Vision Screening - Comments:: Wears rx glasses - up to date with routine eye exams with Dr. Valdemar     Goals Addressed             This Visit's Progress    COMPLETED: Patient Stated       09/22/2021 AWV Goal: Fall Prevention  Over the next year, patient will decrease their risk for falls  by: Using assistive devices, such as a cane or walker, as needed Identifying fall risks within their home and correcting them by: Removing throw rugs Adding handrails to stairs or ramps Removing clutter and keeping a clear pathway throughout the home Increasing light, especially at night Adding shower handles/bars Raising toilet seat Identifying potential personal risk factors for falls: Medication side effects Incontinence/urgency Vestibular dysfunction Hearing loss Musculoskeletal disorders Neurological disorders Orthostatic hypotension       Remain active and independent        Depression Screen    09/26/2023    2:24 PM 08/14/2023    9:42 AM 09/23/2022    9:21 AM 08/12/2022    9:11 AM 07/08/2022    9:09 AM 03/29/2022    9:07 AM 12/22/2021    8:45 AM  PHQ 2/9 Scores  PHQ - 2 Score 0 0 0 0 1 0 0  PHQ- 9 Score 0 0 0 2 3 3      Fall Risk    09/26/2023    2:26 PM 08/14/2023    8:48 AM 09/23/2022    9:20 AM 08/12/2022    9:11 AM 07/08/2022    9:09 AM  Fall Risk   Falls in the past year? 0 0 0 0 0  Number falls in past yr: 0 0 0    Injury with Fall? 0 0 0    Risk for fall due to : No Fall Risks No Fall Risks No Fall Risks    Follow up Falls prevention discussed;Education provided;Falls evaluation completed Education provided Falls prevention discussed      MEDICARE RISK AT HOME: Medicare Risk at Home Any stairs in or around the home?: No If so, are there any without handrails?: No Home free of loose throw rugs in walkways, pet beds, electrical cords, etc?: Yes Adequate lighting in your home to reduce risk of falls?: Yes Life alert?: No Use of a cane, walker or w/c?: No Grab bars in the bathroom?: Yes Shower chair or bench in shower?: No Elevated toilet seat or a handicapped toilet?:  Yes  TIMED UP AND GO:  Was the test performed?  No    Cognitive Function:        09/26/2023    2:26 PM 09/23/2022    9:23 AM 09/21/2020   10:31 AM 09/20/2019   11:31 AM  6CIT Screen   What Year? 0 points 0 points 0 points 0 points  What month? 0 points 0 points 0 points 0 points  What time? 0 points 0 points 0 points 0 points  Count back from 20 0 points 0 points 0 points 0 points  Months in reverse 0 points 0 points 0 points 0 points  Repeat phrase 0 points 0 points 0 points 2 points  Total Score 0 points 0 points 0 points 2 points    Immunizations Immunization History  Administered Date(s) Administered   Moderna Sars-Covid-2 Vaccination 10/30/2019, 11/27/2019, 08/18/2020   Zoster Recombinant(Shingrix ) 09/06/2021, 12/22/2021    TDAP status: Due, Education has been provided regarding the importance of this vaccine. Advised may receive this vaccine at local pharmacy or Health Dept. Aware to provide a copy of the vaccination record if obtained from local pharmacy or Health Dept. Verbalized acceptance and understanding.  Flu Vaccine status: Declined, Education has been provided regarding the importance of this vaccine but patient still declined. Advised may receive this vaccine at local pharmacy or Health Dept. Aware to provide a copy of the vaccination record if obtained from local pharmacy or Health Dept. Verbalized acceptance and understanding.  Pneumococcal vaccine status: Declined,  Education has been provided regarding the importance of this vaccine but patient still declined. Advised may receive this vaccine at local pharmacy or Health Dept. Aware to provide a copy of the vaccination record if obtained from local pharmacy or Health Dept. Verbalized acceptance and understanding.   Covid-19 vaccine status: Declined, Education has been provided regarding the importance of this vaccine but patient still declined. Advised may receive this vaccine at local pharmacy or Health Dept.or vaccine clinic. Aware to provide a copy of the vaccination record if obtained from local pharmacy or Health Dept. Verbalized acceptance and understanding.  Qualifies for Shingles Vaccine? Yes    Zostavax completed No   Shingrix  Completed?: Yes  Screening Tests Health Maintenance  Topic Date Due   COVID-19 Vaccine (4 - 2024-25 season) 04/23/2023   INFLUENZA VACCINE  11/20/2023 (Originally 03/23/2023)   DTaP/Tdap/Td (1 - Tdap) 08/13/2024 (Originally 11/06/1969)   Pneumonia Vaccine 79+ Years old (1 of 1 - PCV) 08/13/2024 (Originally 11/05/2016)   Medicare Annual Wellness (AWV)  09/25/2024   Colonoscopy  06/24/2031   Hepatitis C Screening  Completed   Zoster Vaccines- Shingrix   Completed   HPV VACCINES  Aged Out    Health Maintenance  Health Maintenance Due  Topic Date Due   COVID-19 Vaccine (4 - 2024-25 season) 04/23/2023    Colorectal cancer screening: Type of screening: Colonoscopy. Completed 06/23/21. Repeat every 10 years  Lung Cancer Screening: (Low Dose CT Chest recommended if Age 6-80 years, 20 pack-year currently smoking OR have quit w/in 15years.) does not qualify.   Lung Cancer Screening Referral: n/a  Additional Screening:  Hepatitis C Screening: does qualify; Completed 08/14/23  Vision Screening: Recommended annual ophthalmology exams for early detection of glaucoma and other disorders of the eye. Is the patient up to date with their annual eye exam?  Yes  Who is the provider or what is the name of the office in which the patient attends annual eye exams? Dr. Zamora  If pt is  not established with a provider, would they like to be referred to a provider to establish care? No .   Dental Screening: Recommended annual dental exams for proper oral hygiene  Community Resource Referral / Chronic Care Management: CRR required this visit?  No   CCM required this visit?  No     Plan:     I have personally reviewed and noted the following in the patient's chart:   Medical and social history Use of alcohol, tobacco or illicit drugs  Current medications and supplements including opioid prescriptions. Patient is not currently taking opioid  prescriptions. Functional ability and status Nutritional status Physical activity Advanced directives List of other physicians Hospitalizations, surgeries, and ER visits in previous 12 months Vitals Screenings to include cognitive, depression, and falls Referrals and appointments  In addition, I have reviewed and discussed with patient certain preventive protocols, quality metrics, and best practice recommendations. A written personalized care plan for preventive services as well as general preventive health recommendations were provided to patient.     Lavelle Pfeiffer [place], [place]   02/23/7973   After Visit Summary: (MyChart) Due to this being a telephonic visit, the after visit summary with patients personalized plan was offered to patient via MyChart   Nurse Notes: No concerns at this time

## 2023-10-10 NOTE — Progress Notes (Signed)
 [facility] - Clinic Note  10/13/2023     CHIEF COMPLAINT Patient presents for Retina Follow Up    HISTORY OF PRESENT ILLNESS: Ralph Stevens is a 73 y.o. male who presents to the clinic today for:  HPI     Retina Follow Up   Patient presents with  Other.  In left eye.  This started 9 months ago.  I, the attending physician,  performed the HPI with the patient and updated documentation appropriately.        Comments   Patient here for 9 months retina follow up for ERM OS. Patient states vision doing pretty good. Due for eye exam in March. No eye pain.       Last edited by Rennis Chris, MD on 10/15/2023 12:53 AM.     Patient feels the vision is the same.   Referring physician: Raliegh Ip, DO [address] [place],  [place] [address]  HISTORICAL INFORMATION:   Selected notes from the MEDICAL RECORD NUMBER Referred by PCP (Dr. Nadine Counts) for history of retinal tear OS   CURRENT MEDICATIONS: No current outpatient medications on file. (Ophthalmic Drugs)   No current facility-administered medications for this visit. (Ophthalmic Drugs)   Current Outpatient Medications (Other)  Medication Sig   alfuzosin (UROXATRAL) 10 MG 24 hr tablet Take 1 tablet (10 mg total) by mouth every evening.   amLODipine (NORVASC) 5 MG tablet Take 1 tablet (5 mg total) by mouth daily.   escitalopram (LEXAPRO) 10 MG tablet Take 1 tablet (10 mg total) by mouth daily.   fluticasone (FLONASE) 50 MCG/ACT nasal spray Place 1-2 sprays into both nostrils daily as needed for allergies or rhinitis.   ibuprofen (ADVIL) 200 MG tablet Take 600 mg by mouth every 6 (six) hours as needed for headache or moderate pain.   lisinopril (ZESTRIL) 40 MG tablet Take 1 tablet (40 mg total) by mouth daily.   metoprolol tartrate (LOPRESSOR) 50 MG tablet Take 1 tablet (50 mg total) by mouth 2 (two) times daily.   rosuvastatin (CRESTOR) 20 MG tablet Take 1 tablet (20 mg total) by mouth daily.    No current facility-administered medications for this visit. (Other)   REVIEW OF SYSTEMS: ROS   Positive for: Gastrointestinal, Skin, Musculoskeletal, Eyes Negative for: Constitutional, Neurological, Genitourinary, HENT, Endocrine, Cardiovascular, Respiratory, Psychiatric, Allergic/Imm, Heme/Lymph Last edited by Laddie Aquas, COA on 10/13/2023 10:20 AM.       ALLERGIES Allergies  Allergen Reactions   Gabapentin Hives   PAST MEDICAL HISTORY Past Medical History:  Diagnosis Date   Acid reflux    Arthritis    Arthritis    Cataract    Mixed form OU   Enlarged prostate    PSA goes up and down    GERD (gastroesophageal reflux disease)    High cholesterol    Hyperlipidemia    Hypertension    Hypertensive retinopathy    OU   Past Surgical History:  Procedure Laterality Date   colonoscopy  2019   internal hemorrhoids and pancolonic diverticulosis. Surveillance due in 2024 due to history of adenomatous colon polyps and family history.   COLONOSCOPY N/A 06/23/2021   Hemorrhoids on perianal exam. 2 small 4 hemorrhoid tags, minimal internla hemorrhoids, scars appropriately located. Scattered medium-mouthed diverticula in entire colon.   EYE SURGERY     TONSILLECTOMY     age 73    FAMILY HISTORY Family History  Problem Relation Age of Onset   Cancer Mother  Colon cancer Mother    Dementia Mother    Alzheimer's disease Mother    Emphysema Father    Hypothyroidism Sister    Thyroid disease Sister    Hypertension Sister    Multiple sclerosis Daughter    Heart defect Daughter    Colon cancer Maternal Grandmother    Heart disease Maternal Grandfather    Heart disease Paternal Grandmother    Cancer Paternal Grandfather        unknown ( mouth/throat)   Colon cancer Maternal Uncle    SOCIAL HISTORY Social History   Tobacco Use   Smoking status: Never   Smokeless tobacco: Never  Vaping Use   Vaping status: Never Used  Substance Use Topics   Alcohol use:  Never   Drug use: Never       OPHTHALMIC EXAM:  Base Eye Exam     Visual Acuity (Snellen - Linear)       Right Left   Dist cc 20/25 -2 20/20 -2   Dist ph cc 20/20 -2     Correction: Glasses         Tonometry (Tonopen, 10:17 AM)       Right Left   Pressure 16 14         Pupils       Dark Light Shape React APD   Right 3 2 Round Brisk None   Left 3 2 Round Brisk None         Visual Fields (Counting fingers)       Left Right    Full Full         Extraocular Movement       Right Left    Full, Ortho Full, Ortho         Neuro/Psych     Oriented x3: Yes   Mood/Affect: Normal         Dilation     Both eyes: 1.0% Mydriacyl, 2.5% Phenylephrine @ 10:17 AM           Slit Lamp and Fundus Exam     Slit Lamp Exam       Right Left   Lids/Lashes Dermatochalasis - upper lid Dermatochalasis - upper lid   Conjunctiva/Sclera White and quiet White and quiet   Cornea Trace Punctate epithelial erosions, mild Arcus, Debris in tear film Trace Punctate epithelial erosions, mild Arcus   Anterior Chamber Deep, clear, narrow temporal angle Deep and clear, no cell or pigment   Iris Round and dilated Round and dilated   Lens 2-3+ Nuclear sclerosis with mild brunescence, 2-3+ Cortical cataract 2-3+ Nuclear sclerosis with mild brunescence, 2-3+ Cortical cataract   Anterior Vitreous Vitreous syneresis Vitreous syneresis, clear, no vitritis          Fundus Exam       Right Left   Disc Pink and Sharp Pink and Sharp   C/D Ratio 0.3 0.3   Macula Flat, Good foveal reflex, Retinal pigment epithelial mottling, No heme or edema Flat, blunted foveal reflex, ERM with mild striae, No heme   Vessels mild attenuation, mild tortuosity attenuated, Tortuous   Periphery Attached, No heme  Attached, operculated tear at 1200 with good laser surrounding, No heme; no new RT/RD            IMAGING AND PROCEDURES  Imaging and Procedures for @TODAY @  OCT, Retina - OU - Both  Eyes       Right Eye Quality was good. Central Foveal Thickness: 294. Progression has been  stable. Findings include normal foveal contour, no IRF, no SRF.   Left Eye Quality was good. Central Foveal Thickness: 363. Progression has been stable. Findings include no IRF, no SRF, abnormal foveal contour, epiretinal membrane, macular pucker (Persistent ERM with pucker and sharpening of foveal contour, temporal macula thickening).   Notes *Images captured and stored on drive  Diagnosis / Impression:  OD: NFP, no IRF/SRF OS: Persistent ERM with pucker and sharpening of foveal contour -- temporal macula thickening  Clinical management:  See below  Abbreviations: NFP - Normal foveal profile. CME - cystoid macular edema. PED - pigment epithelial detachment. IRF - intraretinal fluid. SRF - subretinal fluid. EZ - ellipsoid zone. ERM - epiretinal membrane. ORA - outer retinal atrophy. ORT - outer retinal tubulation. SRHM - subretinal hyper-reflective material            ASSESSMENT/PLAN:    ICD-10-CM   1. Epiretinal membrane (ERM) of left eye  H35.372 OCT, Retina - OU - Both Eyes    2. History of repair of retinal tear by laser photocoagulation  Z98.890     3. Essential hypertension  I10     4. Hypertensive retinopathy of both eyes  H35.033     5. Combined forms of age-related cataract of both eyes  H25.813     6. 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 Epiretinal membrane, OS  - likely related to history of RT s/p laser retinopexy (see below) - OCT shows persistent ERM with pucker and sharpening of foveal contour -- mild thickening of temporal macula  - BCVA remains 2020 OU  - asymptomatic, no metamorphopsia  - no indication for surgery at this time  - monitor for now  - f/u 1 yr, DFE, OCT  2. History of retinal tear OS  - operculated tear located at 1200 - s/p laser retinopexy (Dr. Garner Nash, [facility], [place]. [place], [place]) -- good laser surrounding  -  stable   - no new RT/RD  - monitor  3,4. Hypertensive retinopathy OU  - discussed importance of tight BP control  - monitor  5. Mixed form age related cataracts OU - The symptoms of cataract, surgical options, and treatments and risks were discussed with patient.  - discussed diagnosis and progression  - not yet visually significant  - monitor for now  6. H/o Herpes zoster with ocular involvement OS -- stably resolved - pt diagnosed w/ herpes zoster on 3.21.22 and completed 1g po valtrex TID x10 days - exam shows stable improvement in periorbital vesicles in V1 dermatome (no eyelid vesicles, negative Hutchinson's sign) and periorbital edema resolved  - cornea exam shows stable improvement in mild inf KP OS -- resolved  Ophthalmic Meds Ordered this visit:  No orders of the defined types were placed in this encounter.     Return in about 1 year (around 10/12/2024) for f/u ERM OS, DFE, OCT.  There are no Patient Instructions on file for this visit.  This document serves as a record of services personally performed by Karie Chimera, MD, PhD. It was created on their behalf by Berlin Hun COT, an ophthalmic technician. The creation of this record is the provider's dictation and/or activities during the visit.    Electronically signed by: Berlin Hun COT 02.18.25 12:54 AM  This document serves as a record of services personally performed by Karie Chimera, MD, PhD. It was created on their behalf by Charlette Caffey, COT an ophthalmic technician. The creation  of this record is the provider's dictation and/or activities during the visit.    Electronically signed by:  Charlette Caffey, COT  10/15/23 12:54 AM  Karie Chimera, M.D., Ph.D. Diseases & Surgery of the Retina and Vitreous [facility] 10/13/2023   I have reviewed the above documentation for accuracy and completeness, and I agree with the above. Karie Chimera, M.D., Ph.D.  10/15/23 12:55 AM   Abbreviations: M myopia (nearsighted); A astigmatism; H hyperopia (farsighted); P presbyopia; Mrx spectacle prescription;  CTL contact lenses; OD right eye; OS left eye; OU both eyes  XT exotropia; ET esotropia; PEK punctate epithelial keratitis; PEE punctate epithelial erosions; DES dry eye syndrome; MGD meibomian gland dysfunction; ATs artificial tears; PFAT's preservative free artificial tears; NSC nuclear sclerotic cataract; PSC posterior subcapsular cataract; ERM epi-retinal membrane; PVD posterior vitreous detachment; RD retinal detachment; DM diabetes mellitus; DR diabetic retinopathy; NPDR non-proliferative diabetic retinopathy; PDR proliferative diabetic retinopathy; CSME clinically significant macular edema; DME diabetic macular edema; dbh dot blot hemorrhages; CWS cotton wool spot; POAG primary open angle glaucoma; C/D cup-to-disc ratio; HVF humphrey visual field; GVF goldmann visual field; OCT optical coherence tomography; IOP intraocular pressure; BRVO Branch retinal vein occlusion; CRVO central retinal vein occlusion; CRAO central retinal artery occlusion; BRAO branch retinal artery occlusion; RT retinal tear; SB scleral buckle; PPV pars plana vitrectomy; VH Vitreous hemorrhage; PRP panretinal laser photocoagulation; IVK intravitreal kenalog; VMT vitreomacular traction; MH Macular hole;  NVD neovascularization of the disc; NVE neovascularization elsewhere; AREDS age related eye disease study; ARMD age related macular degeneration; POAG primary open angle glaucoma; EBMD epithelial/anterior basement membrane dystrophy; ACIOL anterior chamber intraocular lens; IOL intraocular lens; PCIOL posterior chamber intraocular lens; Phaco/IOL phacoemulsification with intraocular lens placement; PRK photorefractive keratectomy; LASIK laser assisted in situ keratomileusis; HTN hypertension; DM diabetes mellitus; COPD chronic obstructive pulmonary disease

## 2023-10-11 ENCOUNTER — Encounter (INDEPENDENT_AMBULATORY_CARE_PROVIDER_SITE_OTHER): Payer: Medicare Other | Admitting: Ophthalmology

## 2023-10-13 ENCOUNTER — Encounter (INDEPENDENT_AMBULATORY_CARE_PROVIDER_SITE_OTHER): Payer: Self-pay | Admitting: Ophthalmology

## 2023-10-13 ENCOUNTER — Ambulatory Visit (INDEPENDENT_AMBULATORY_CARE_PROVIDER_SITE_OTHER): Payer: Medicare Other | Admitting: Ophthalmology

## 2023-10-13 DIAGNOSIS — H35372 Puckering of macula, left eye: Secondary | ICD-10-CM

## 2023-10-13 DIAGNOSIS — I10 Essential (primary) hypertension: Secondary | ICD-10-CM

## 2023-10-13 DIAGNOSIS — H35033 Hypertensive retinopathy, bilateral: Secondary | ICD-10-CM

## 2023-10-13 DIAGNOSIS — Z9889 Other specified postprocedural states: Secondary | ICD-10-CM

## 2023-10-13 DIAGNOSIS — B0233 Zoster keratitis: Secondary | ICD-10-CM

## 2023-10-13 DIAGNOSIS — H25813 Combined forms of age-related cataract, bilateral: Secondary | ICD-10-CM

## 2023-10-15 ENCOUNTER — Encounter (INDEPENDENT_AMBULATORY_CARE_PROVIDER_SITE_OTHER): Payer: Self-pay | Admitting: Ophthalmology

## 2023-10-17 ENCOUNTER — Ambulatory Visit: Payer: Self-pay | Admitting: General Surgery

## 2023-10-17 DIAGNOSIS — K642 Third degree hemorrhoids: Secondary | ICD-10-CM | POA: Diagnosis not present

## 2023-10-17 NOTE — H&P (Signed)
 REFERRING PHYSICIAN:  Nira Retort, NP  PROVIDER:  Elenora Gamma, MD  MRN: [id number] DOB: 30-Apr-1951 DATE OF ENCOUNTER: 10/17/2023  Subjective   Chief Complaint: No chief complaint on file.     History of Present Illness: Ralph Stevens is a 73 y.o. male who is seen today as an office consultation at the request of Gerrit Halls for evaluation of No chief complaint on file. .  Pt banded in the past with his GI office.  Underwent RBL in 2022, 23 and 24 for grade 3 hemorrhoids.  Last attempt was 4 months ago. He continue to have issues with prolapse and bleeding.      Review of Systems: A complete review of systems was obtained from the patient.  I have reviewed this information and discussed as appropriate with the patient.  See HPI as well for other ROS.   Medical History: Past Medical History:  Diagnosis Date   Anxiety    Arthritis    GERD (gastroesophageal reflux disease)    Hypertension     There is no problem list on file for this patient.   Past Surgical History:  Procedure Laterality Date   TONSILLECTOMY       Allergies  Allergen Reactions   Gabapentin Hives    Current Outpatient Medications on File Prior to Visit  Medication Sig Dispense Refill   alfuzosin (UROXATRAL) 10 mg ER tablet Take 10 mg by mouth every evening     amLODIPine (NORVASC) 5 MG tablet Take 5 mg by mouth once daily     escitalopram oxalate (LEXAPRO) 10 MG tablet Take 1 tablet by mouth once daily     fluticasone propionate (FLONASE) 50 mcg/actuation nasal spray Place 1-2 sprays into one nostril     lisinopriL (ZESTRIL) 40 MG tablet Take 40 mg by mouth once daily     metoprolol TARTrate (LOPRESSOR) 50 MG tablet Take 1 tablet by mouth 2 (two) times daily     rosuvastatin (CRESTOR) 20 MG tablet Take 20 mg by mouth once daily     ibuprofen (MOTRIN) 200 MG tablet Take 600 mg by mouth     No current facility-administered medications on file prior to visit.    Family History  Problem  Relation Age of Onset   High blood pressure (Hypertension) Mother    Hyperlipidemia (Elevated cholesterol) Mother    Colon cancer Mother      Social History   Tobacco Use  Smoking Status Never  Smokeless Tobacco Never     Social History   Socioeconomic History   Marital status: Married  Tobacco Use   Smoking status: Never   Smokeless tobacco: Never  Vaping Use   Vaping status: Never Used  Substance and Sexual Activity   Alcohol use: Never   Drug use: Never   Social Drivers of Corporate investment banker Strain: Low Risk  (09/26/2023)   Received from [facility]   Overall Financial Resource Strain (CARDIA)    Difficulty of Paying Living Expenses: Not hard at all  Food Insecurity: No Food Insecurity (09/26/2023)   Received from [facility]   Hunger Vital Sign    Worried About Running Out of Food in the Last Year: Never true    Ran Out of Food in the Last Year: Never true  Transportation Needs: No Transportation Needs (09/26/2023)   Received from [facility] - Transportation    Lack of Transportation (Medical): No    Lack of Transportation (Non-Medical): No  Physical Activity: Insufficiently Active (09/26/2023)   Received from [facility]   Exercise Vital Sign    Days of Exercise per Week: 3 days    Minutes of Exercise per Session: 30 min  Stress: No Stress Concern Present (09/26/2023)   Received from [facility] - Occupational Stress Questionnaire    Feeling of Stress : Not at all  Social Connections: Moderately Isolated (09/26/2023)   Received from [facility]   Social Connection and Isolation Panel [NHANES]    Frequency of Communication with Friends and Family: More than three times a week    Frequency of Social Gatherings with Friends and Family: Three times a week    Attends Religious Services: Never    Active Member of Clubs or Organizations: No    Attends Banker Meetings: Never    Marital Status:  Married  Housing Stability: Unknown (10/17/2023)   Housing Stability Vital Sign    Homeless in the Last Year: No    Objective:    Vitals:   10/17/23 1048  BP: (!) 140/80  Pulse: 87  Temp: 37.1 C (98.7 F)  SpO2: 93%  Weight: 89 kg (196 lb 3.2 oz)  Height: 182.9 cm (6')  PainSc: 0-No pain     Exam Gen: NAD Abd: soft Rectal: chronically inflamed skin tags, Grade 3 LL hem   Labs, Imaging and Diagnostic Testing:  Procedure: Anoscopy Surgeon: Maisie Fus After the risks and benefits were explained, written consent was obtained for above procedure.  A medical assistant chaperone was present thoroughout the entire procedure.  Anesthesia: none Diagnosis: rectal bleeding Findings: inflamed grade 3 LL hem, grade 1 RA and RP  Assessment and Plan:  Diagnoses and all orders for this visit:  Prolapsed internal hemorrhoids, grade 3     73 y.o. M with chronically inflamed external hemorrhoids with bleeding and prolapsed grade 3 LL hemorrhoid.  We discussed THD vs standard hemorrhoidectomy.  We decided to proceed with 3 column hemorrhoidectomy due to his significant external disease.  All questions were answered.  We discussed the significant pain associated with this surgery and the risk for urinary retention, as well as bleeding and a small change of stricture or recurrence.  Pt wishes to proceed with surgery.    Vanita Panda, MD Colon and Rectal Surgery [facility]

## 2023-10-18 DIAGNOSIS — M1712 Unilateral primary osteoarthritis, left knee: Secondary | ICD-10-CM | POA: Diagnosis not present

## 2023-10-27 ENCOUNTER — Encounter (INDEPENDENT_AMBULATORY_CARE_PROVIDER_SITE_OTHER): Payer: Medicare Other | Admitting: Ophthalmology

## 2023-11-20 ENCOUNTER — Ambulatory Visit: Admitting: Family Medicine

## 2023-11-21 ENCOUNTER — Ambulatory Visit (INDEPENDENT_AMBULATORY_CARE_PROVIDER_SITE_OTHER): Admitting: Family Medicine

## 2023-11-21 ENCOUNTER — Encounter: Payer: Self-pay | Admitting: Family Medicine

## 2023-11-21 VITALS — BP 140/80 | HR 67 | Temp 98.7°F | Ht 72.0 in | Wt 193.2 lb

## 2023-11-21 DIAGNOSIS — I10 Essential (primary) hypertension: Secondary | ICD-10-CM | POA: Diagnosis not present

## 2023-11-21 DIAGNOSIS — Z01818 Encounter for other preprocedural examination: Secondary | ICD-10-CM

## 2023-11-21 DIAGNOSIS — E782 Mixed hyperlipidemia: Secondary | ICD-10-CM | POA: Diagnosis not present

## 2023-11-21 LAB — COAGUCHEK XS/INR WAIVED
INR: 1.1 (ref 0.9–1.1)
Prothrombin Time: 12.9 s

## 2023-11-21 NOTE — Progress Notes (Signed)
 Ralph Stevens is a 73 y.o. male who is here for preoperative clearance for hemorrhoid surgery with Dr. Romie Levee and left knee arthroplasty with Dr. Charlann Boxer.  He denies any chest pain, shortness of breath, difficulty bending the neck.  He reports having had bleeding hemorrhoids and this is why he is getting hemorrhoid surgery.  He has been off of aspirin and NSAIDs to reduce bleeding risk.  He has been off of these for over a month.  He denies any lower extremity edema.  He has been working on lifestyle modification to reduce sugar.  Has had history of prediabetes.  1) High Risk Cardiac Conditions  1) Recent MI - No.  2) Decompensated Heart Failure - No.  3) Unstable angina - No.  4) Symptomatic arrythmia - No.  5) Sx Valvular Disease - No.  2) Intermediate Risk Factors - DM, CKD, CVA, CHF, CAD - No.  2) Functional Status - > 4 mets (Walk, run, climb stairs) Yes.    3) Surgery Specific Risk - Intermediate   4) Further Noninvasive evaluation -   1) EKG - Yes.    2) Echo - No.   1) Worsening dyspnea   3) Stress Testing - Active Cardiac Disease - No.  5) Need for medical therapy - Beta Blocker, Statins indicated ? No.  PE: Vitals:   11/21/23 1417  BP: (!) 142/84  Pulse: 67  Temp: 98.7 F (37.1 C)  SpO2: 97%   Physical Examination: General appearance - alert, well appearing, and in no distress Mental status - alert, oriented to person, place, and time Eyes - pupils equal and reactive, extraocular eye movements intact Mouth -Mallampati 2.  Moist mucous membranes. Neck - supple, no significant adenopathy, is able to independently extend neck Chest - clear to auscultation, no wheezes, rales or rhonchi, symmetric air entry Heart - normal rate, regular rhythm, normal S1, S2, no murmurs, rubs, clicks or gallops Musculoskeletal -ambulating independently  Preop examination - Plan: EKG 12-Lead, CMP14+EGFR, CBC, CoaguChek XS/INR Waived  Mixed hyperlipidemia - Plan: EKG 12-Lead, CMP14+EGFR, CBC,  CoaguChek XS/INR Waived  Essential hypertension - Plan: EKG 12-Lead, CMP14+EGFR, CBC, CoaguChek XS/INR Waived  I have independently evaluated patient.  Ralph Stevens is a 73 y.o. male who is low risk for a intermediate risk surgery.  There are not modifiable risk factors (smoking, etc). Ralph Stevens's RCRI calculation for MACE is: 0.   Blood pressure under excellent control.  EKG without evidence of ischemia or arrhythmia.  It looks like they are going to collect the A1c they are but he is not a diabetic so anticipate this to be an acceptable range for surgery.  Will check renal function, liver enzymes, INR and CBC in anticipation of upcoming surgery.  Will fax preop clearance to both specialists   Towers Burrill M. Nadine Counts, DO [facility]

## 2023-11-22 ENCOUNTER — Encounter: Payer: Self-pay | Admitting: Family Medicine

## 2023-11-22 LAB — CMP14+EGFR
ALT: 16 IU/L (ref 0–44)
AST: 14 IU/L (ref 0–40)
Albumin: 4.4 g/dL (ref 3.8–4.8)
Alkaline Phosphatase: 71 IU/L (ref 44–121)
BUN/Creatinine Ratio: 13 (ref 10–24)
BUN: 12 mg/dL (ref 8–27)
Bilirubin Total: 0.7 mg/dL (ref 0.0–1.2)
CO2: 22 mmol/L (ref 20–29)
Calcium: 8.9 mg/dL (ref 8.6–10.2)
Chloride: 104 mmol/L (ref 96–106)
Creatinine, Ser: 0.96 mg/dL (ref 0.76–1.27)
Globulin, Total: 2.8 g/dL (ref 1.5–4.5)
Glucose: 95 mg/dL (ref 70–99)
Potassium: 3.9 mmol/L (ref 3.5–5.2)
Sodium: 140 mmol/L (ref 134–144)
Total Protein: 7.2 g/dL (ref 6.0–8.5)
eGFR: 84 mL/min/{1.73_m2} (ref >59)

## 2023-11-22 LAB — CBC
Hematocrit: 42.8 % (ref 37.5–51.0)
Hemoglobin: 14.1 g/dL (ref 13.0–17.7)
MCH: 28.7 pg (ref 26.6–33.0)
MCHC: 32.9 g/dL (ref 31.5–35.7)
MCV: 87 fL (ref 79–97)
Platelets: 252 10*3/uL (ref 150–450)
RBC: 4.91 x10E6/uL (ref 4.14–5.80)
RDW: 13.5 % (ref 11.6–15.4)
WBC: 6.9 10*3/uL (ref 3.4–10.8)

## 2023-11-27 NOTE — Progress Notes (Signed)
 Anesthesia Review:  PCP: Ralph Stevens preop visit 11/21/23  Cardiologist : none   PPM/ ICD: Device Orders: Rep Notified:  Chest x-ray : EKG : 11/21/23  Echo : Stress test: Cardiac Cath :   Activity level: can do a flight of stairs without difficulty  Sleep Study/ CPAP : none  Fasting Blood Sugar :      / Checks Blood Sugar -- times a day:    Blood Thinner/ Instructions /Last Dose: ASA / Instructions/ Last Dose :    11/21/23- cbc and cmp - in epic

## 2023-11-27 NOTE — Patient Instructions (Signed)
 SURGICAL WAITING ROOM VISITATION  Patients having surgery or a procedure may have no more than 2 support people in the waiting area - these visitors may rotate.    Children under the age of 67 must have an adult with them who is not the patient.  Due to an increase in RSV and influenza rates and associated hospitalizations, children ages 27 and under may not visit patients in [facility] hospitals.  Visitors with respiratory illnesses are discouraged from visiting and should remain at home.  If the patient needs to stay at the hospital during part of their recovery, the visitor guidelines for inpatient rooms apply. Pre-op nurse will coordinate an appropriate time for 1 support person to accompany patient in pre-op.  This support person may not rotate.    Please refer to the [facility] website for the visitor guidelines for Inpatients (after your surgery is over and you are in a regular room).       Your procedure is scheduled on: 12/06/2023    Report to [facility] Main Entrance    Report to admitting at  1000 AM   Call this number if you have problems the morning of surgery [phone]   Do not eat food :After Midnight.   After Midnight you may have the following liquids until ___ 0930___ AM DAY OF SURGERY  Water Non-Citrus Juices (without pulp, NO RED-Apple, White grape, White cranberry) Black Coffee (NO MILK/CREAM OR CREAMERS, sugar ok)  Clear Tea (NO MILK/CREAM OR CREAMERS, sugar ok) regular and decaf                             Plain Jell-O (NO RED)                                           Fruit ices (not with fruit pulp, NO RED)                                     Popsicles (NO RED)                                                               Sports drinks like Gatorade (NO RED)                            If you have questions, please contact your surgeon's office.       Oral Hygiene is also important to reduce your risk of infection.                                     Remember - BRUSH YOUR TEETH THE MORNING OF SURGERY WITH YOUR REGULAR TOOTHPASTE  DENTURES WILL BE REMOVED PRIOR TO SURGERY PLEASE DO NOT APPLY "Poly grip" OR ADHESIVES!!!   Do NOT smoke after Midnight   Stop all vitamins and herbal supplements 7 days before surgery.   Take these medicines the morning of surgery with A  SIP OF WATER:  amlodiopine, lexapro, flonase if needed, metoprolol  DO NOT TAKE ANY ORAL DIABETIC MEDICATIONS DAY OF YOUR SURGERY  Bring CPAP mask and tubing day of surgery.                              You may not have any metal on your body including hair pins, jewelry, and body piercing             Do not wear make-up, lotions, powders, perfumes/cologne, or deodorant  Do not wear nail polish including gel and S&S, artificial/acrylic nails, or any other type of covering on natural nails including finger and toenails. If you have artificial nails, gel coating, etc. that needs to be removed by a nail salon please have this removed prior to surgery or surgery may need to be canceled/ delayed if the surgeon/ anesthesia feels like they are unable to be safely monitored.   Do not shave  48 hours prior to surgery.               Men may shave face and neck.   Do not bring valuables to the hospital. [facility] IS NOT             RESPONSIBLE   FOR VALUABLES.   Contacts, glasses, dentures or bridgework may not be worn into surgery.   Bring small overnight bag day of surgery.   DO NOT BRING YOUR HOME MEDICATIONS TO THE HOSPITAL. PHARMACY WILL DISPENSE MEDICATIONS LISTED ON YOUR MEDICATION LIST TO YOU DURING YOUR ADMISSION IN THE HOSPITAL!    Patients discharged on the day of surgery will not be allowed to drive home.  Someone NEEDS to stay with you for the first 24 hours after anesthesia.   Special Instructions: Bring a copy of your healthcare power of attorney and living will documents the day of surgery if you haven't scanned them before.               Please read over the following fact sheets you were given: IF YOU HAVE QUESTIONS ABOUT YOUR PRE-OP INSTRUCTIONS PLEASE CALL [phone]   If you received a COVID test during your pre-op visit  it is requested that you wear a mask when out in public, stay away from anyone that may not be feeling well and notify your surgeon if you develop symptoms. If you test positive for Covid or have been in contact with anyone that has tested positive in the last 10 days please notify you surgeon.    [facility] - Preparing for Surgery Before surgery, you can play an important role.  Because skin is not sterile, your skin needs to be as free of germs as possible.  You can reduce the number of germs on your skin by washing with CHG (chlorahexidine gluconate) soap before surgery.  CHG is an antiseptic cleaner which kills germs and bonds with the skin to continue killing germs even after washing. Please DO NOT use if you have an allergy to CHG or antibacterial soaps.  If your skin becomes reddened/irritated stop using the CHG and inform your nurse when you arrive at Short Stay. Do not shave (including legs and underarms) for at least 48 hours prior to the first CHG shower.  You may shave your face/neck. Please follow these instructions carefully:  1.  Shower with CHG Soap the night before surgery and the  morning of Surgery.  2.  If you  choose to wash your hair, wash your hair first as usual with your  normal  shampoo.  3.  After you shampoo, rinse your hair and body thoroughly to remove the  shampoo.                           4.  Use CHG as you would any other liquid soap.  You can apply chg directly  to the skin and wash                       Gently with a scrungie or clean washcloth.  5.  Apply the CHG Soap to your body ONLY FROM THE NECK DOWN.   Do not use on face/ open                           Wound or open sores. Avoid contact with eyes, ears mouth and genitals (private parts).                       Wash  face,  Genitals (private parts) with your normal soap.             6.  Wash thoroughly, paying special attention to the area where your surgery  will be performed.  7.  Thoroughly rinse your body with warm water from the neck down.  8.  DO NOT shower/wash with your normal soap after using and rinsing off  the CHG Soap.                9.  Pat yourself dry with a clean towel.            10.  Wear clean pajamas.            11.  Place clean sheets on your bed the night of your first shower and do not  sleep with pets. Day of Surgery : Do not apply any lotions/deodorants the morning of surgery.  Please wear clean clothes to the hospital/surgery center.  FAILURE TO FOLLOW THESE INSTRUCTIONS MAY RESULT IN THE CANCELLATION OF YOUR SURGERY PATIENT SIGNATURE_________________________________  NURSE SIGNATURE__________________________________  ________________________________________________________________________

## 2023-11-29 ENCOUNTER — Other Ambulatory Visit: Payer: Self-pay

## 2023-11-29 ENCOUNTER — Encounter (HOSPITAL_COMMUNITY)
Admission: RE | Admit: 2023-11-29 | Discharge: 2023-11-29 | Disposition: A | Discharge status: Home / Self Care | Source: Ambulatory Visit | Attending: General Surgery | Admitting: General Surgery

## 2023-11-29 ENCOUNTER — Encounter (HOSPITAL_COMMUNITY): Payer: Self-pay

## 2023-11-29 VITALS — BP 142/80 | HR 63 | Temp 98.0°F | Resp 16 | Ht 72.0 in | Wt 190.0 lb

## 2023-11-29 DIAGNOSIS — Z01818 Encounter for other preprocedural examination: Secondary | ICD-10-CM | POA: Diagnosis not present

## 2023-11-29 HISTORY — DX: Family history of other specified conditions: Z84.89

## 2023-11-29 HISTORY — DX: Pneumonia, unspecified organism: J18.9

## 2023-12-04 DIAGNOSIS — M1712 Unilateral primary osteoarthritis, left knee: Secondary | ICD-10-CM | POA: Diagnosis not present

## 2023-12-06 ENCOUNTER — Encounter (HOSPITAL_COMMUNITY): Payer: Self-pay | Admitting: General Surgery

## 2023-12-06 ENCOUNTER — Other Ambulatory Visit: Payer: Self-pay

## 2023-12-06 ENCOUNTER — Ambulatory Visit (HOSPITAL_COMMUNITY)
Admission: RE | Admit: 2023-12-06 | Discharge: 2023-12-06 | Disposition: A | Discharge status: Home / Self Care | Payer: Medicare Other | Attending: General Surgery | Admitting: General Surgery

## 2023-12-06 ENCOUNTER — Encounter (HOSPITAL_COMMUNITY)
Admission: RE | Disposition: A | Discharge status: Home / Self Care | Payer: Self-pay | Source: Home / Self Care | Attending: General Surgery

## 2023-12-06 ENCOUNTER — Ambulatory Visit (HOSPITAL_BASED_OUTPATIENT_CLINIC_OR_DEPARTMENT_OTHER): Admitting: Certified Registered"

## 2023-12-06 ENCOUNTER — Ambulatory Visit (HOSPITAL_COMMUNITY): Payer: Self-pay | Admitting: Physician Assistant

## 2023-12-06 DIAGNOSIS — Z79899 Other long term (current) drug therapy: Secondary | ICD-10-CM | POA: Diagnosis not present

## 2023-12-06 DIAGNOSIS — I10 Essential (primary) hypertension: Secondary | ICD-10-CM | POA: Insufficient documentation

## 2023-12-06 DIAGNOSIS — K642 Third degree hemorrhoids: Secondary | ICD-10-CM

## 2023-12-06 DIAGNOSIS — Z01818 Encounter for other preprocedural examination: Secondary | ICD-10-CM

## 2023-12-06 DIAGNOSIS — K449 Diaphragmatic hernia without obstruction or gangrene: Secondary | ICD-10-CM | POA: Diagnosis not present

## 2023-12-06 DIAGNOSIS — M1990 Unspecified osteoarthritis, unspecified site: Secondary | ICD-10-CM | POA: Diagnosis not present

## 2023-12-06 DIAGNOSIS — J189 Pneumonia, unspecified organism: Secondary | ICD-10-CM | POA: Diagnosis not present

## 2023-12-06 DIAGNOSIS — K219 Gastro-esophageal reflux disease without esophagitis: Secondary | ICD-10-CM | POA: Insufficient documentation

## 2023-12-06 DIAGNOSIS — E785 Hyperlipidemia, unspecified: Secondary | ICD-10-CM | POA: Diagnosis not present

## 2023-12-06 HISTORY — PX: HEMORRHOID SURGERY: SHX153

## 2023-12-06 SURGERY — HEMORRHOIDECTOMY
Anesthesia: Monitor Anesthesia Care

## 2023-12-06 MED ORDER — LIDOCAINE HCL (PF) 2 % IJ SOLN
INTRAMUSCULAR | Status: AC
  Filled 2023-12-06: qty 5

## 2023-12-06 MED ORDER — EPHEDRINE 5 MG/ML INJ
INTRAVENOUS | Status: AC
  Filled 2023-12-06: qty 5

## 2023-12-06 MED ORDER — ACETAMINOPHEN 500 MG PO TABS
1000.0000 mg | ORAL_TABLET | ORAL | Status: AC
  Administered 2023-12-06: 1000 mg via ORAL
  Filled 2023-12-06: qty 2

## 2023-12-06 MED ORDER — BUPIVACAINE LIPOSOME 1.3 % IJ SUSP
INTRAMUSCULAR | Status: AC
  Filled 2023-12-06: qty 20

## 2023-12-06 MED ORDER — PROPOFOL 500 MG/50ML IV EMUL
INTRAVENOUS | Status: DC | PRN
  Administered 2023-12-06: 125 ug/kg/min via INTRAVENOUS

## 2023-12-06 MED ORDER — DEXMEDETOMIDINE HCL IN NACL 200 MCG/50ML IV SOLN
INTRAVENOUS | Status: DC | PRN
  Administered 2023-12-06 (×3): 4 ug via INTRAVENOUS

## 2023-12-06 MED ORDER — FENTANYL CITRATE (PF) 100 MCG/2ML IJ SOLN
INTRAMUSCULAR | Status: AC
  Filled 2023-12-06: qty 2

## 2023-12-06 MED ORDER — PROPOFOL 10 MG/ML IV BOLUS
INTRAVENOUS | Status: AC
  Filled 2023-12-06: qty 20

## 2023-12-06 MED ORDER — BUPIVACAINE-EPINEPHRINE (PF) 0.5% -1:200000 IJ SOLN
INTRAMUSCULAR | Status: DC | PRN
  Administered 2023-12-06: 50 mL

## 2023-12-06 MED ORDER — PROPOFOL 500 MG/50ML IV EMUL
INTRAVENOUS | Status: AC
  Filled 2023-12-06: qty 50

## 2023-12-06 MED ORDER — OXYCODONE HCL 5 MG PO TABS
ORAL_TABLET | ORAL | Status: AC
  Filled 2023-12-06: qty 1

## 2023-12-06 MED ORDER — PROPOFOL 10 MG/ML IV BOLUS
INTRAVENOUS | Status: DC | PRN
  Administered 2023-12-06: 20 mg via INTRAVENOUS

## 2023-12-06 MED ORDER — OXYCODONE HCL 5 MG/5ML PO SOLN: 5.0000 mg | Freq: Once | ORAL | Status: AC | PRN

## 2023-12-06 MED ORDER — ORAL CARE MOUTH RINSE: 15.0000 mL | Freq: Once | OROMUCOSAL | Status: AC

## 2023-12-06 MED ORDER — DEXMEDETOMIDINE HCL IN NACL 80 MCG/20ML IV SOLN
INTRAVENOUS | Status: AC
  Filled 2023-12-06: qty 20

## 2023-12-06 MED ORDER — CHLORHEXIDINE GLUCONATE 0.12 % MT SOLN
15.0000 mL | Freq: Once | OROMUCOSAL | Status: AC
  Administered 2023-12-06: 15 mL via OROMUCOSAL

## 2023-12-06 MED ORDER — DEXAMETHASONE SODIUM PHOSPHATE 10 MG/ML IJ SOLN
INTRAMUSCULAR | Status: AC
  Filled 2023-12-06: qty 1

## 2023-12-06 MED ORDER — TRAMADOL HCL 50 MG PO TABS: 50.0000 mg | ORAL_TABLET | Freq: Four times a day (QID) | ORAL | 0 refills | Status: DC | PRN

## 2023-12-06 MED ORDER — FENTANYL CITRATE (PF) 100 MCG/2ML IJ SOLN
INTRAMUSCULAR | Status: DC | PRN
  Administered 2023-12-06 (×2): 25 ug via INTRAVENOUS
  Administered 2023-12-06: 50 ug via INTRAVENOUS

## 2023-12-06 MED ORDER — 0.9 % SODIUM CHLORIDE (POUR BTL) OPTIME
TOPICAL | Status: DC | PRN
  Administered 2023-12-06: 1000 mL

## 2023-12-06 MED ORDER — BUPIVACAINE-EPINEPHRINE (PF) 0.5% -1:200000 IJ SOLN
INTRAMUSCULAR | Status: AC
Start: 2023-12-06 — End: ?
  Filled 2023-12-06: qty 30

## 2023-12-06 MED ORDER — LACTATED RINGERS IV SOLN: INTRAVENOUS | Status: DC

## 2023-12-06 MED ORDER — ONDANSETRON HCL 4 MG/2ML IJ SOLN
INTRAMUSCULAR | Status: AC
  Filled 2023-12-06: qty 2

## 2023-12-06 MED ORDER — DIAZEPAM 5 MG PO TABS: 5.0000 mg | ORAL_TABLET | Freq: Four times a day (QID) | ORAL | 0 refills | Status: DC | PRN

## 2023-12-06 MED ORDER — BUPIVACAINE LIPOSOME 1.3 % IJ SUSP: 20.0000 mL | Freq: Once | INTRAMUSCULAR | Status: DC

## 2023-12-06 MED ORDER — DROPERIDOL 2.5 MG/ML IJ SOLN: 0.6250 mg | Freq: Once | INTRAMUSCULAR | Status: DC | PRN

## 2023-12-06 MED ORDER — OXYCODONE HCL 5 MG PO TABS
5.0000 mg | ORAL_TABLET | Freq: Once | ORAL | Status: AC | PRN
  Administered 2023-12-06: 5 mg via ORAL

## 2023-12-06 MED ORDER — FENTANYL CITRATE PF 50 MCG/ML IJ SOSY: 25.0000 ug | PREFILLED_SYRINGE | INTRAMUSCULAR | Status: DC | PRN

## 2023-12-06 MED ORDER — ONDANSETRON HCL 4 MG/2ML IJ SOLN
INTRAMUSCULAR | Status: DC | PRN
  Administered 2023-12-06: 4 mg via INTRAVENOUS

## 2023-12-06 MED ORDER — SODIUM CHLORIDE 0.9% FLUSH: 3.0000 mL | Freq: Two times a day (BID) | INTRAVENOUS | Status: DC

## 2023-12-06 SURGICAL SUPPLY — 26 items
BAG COUNTER SPONGE SURGICOUNT (BAG) IMPLANT
BLADE SURG 15 STRL LF DISP TIS (BLADE) ×1 IMPLANT
BLADE SURG SZ10 CARB STEEL (BLADE) ×1 IMPLANT
DRAIN PENROSE 0.25X18 (DRAIN) IMPLANT
DRAPE LAPAROTOMY T 98X78 PEDS (DRAPES) ×1 IMPLANT
ELECT REM PT RETURN 15FT ADLT (MISCELLANEOUS) ×1 IMPLANT
GAUZE 4X4 16PLY ~~LOC~~+RFID DBL (SPONGE) ×1 IMPLANT
GAUZE PAD ABD 8X10 STRL (GAUZE/BANDAGES/DRESSINGS) IMPLANT
GAUZE SPONGE 4X4 12PLY STRL (GAUZE/BANDAGES/DRESSINGS) IMPLANT
GLOVE BIO SURGEON STRL SZ 6.5 (GLOVE) ×1 IMPLANT
GLOVE INDICATOR 6.5 STRL GRN (GLOVE) ×2 IMPLANT
GOWN STRL REUS W/ TWL XL LVL3 (GOWN DISPOSABLE) ×2 IMPLANT
KIT BASIN OR (CUSTOM PROCEDURE TRAY) ×1 IMPLANT
KIT TURNOVER KIT A (KITS) ×1 IMPLANT
PACK GENERAL/GYN (CUSTOM PROCEDURE TRAY) ×1 IMPLANT
PACK LITHOTOMY IV (CUSTOM PROCEDURE TRAY) ×1 IMPLANT
PENCIL SMOKE EVACUATOR (MISCELLANEOUS) IMPLANT
SET IRRIG Y TYPE TUR BLADDER L (SET/KITS/TRAYS/PACK) ×1 IMPLANT
SHEARS HARMONIC 9CM CVD (BLADE) IMPLANT
SURGILUBE 2OZ TUBE FLIPTOP (MISCELLANEOUS) ×1 IMPLANT
SUT CHROMIC 2 0 SH (SUTURE) ×1 IMPLANT
SUT CHROMIC 3 0 SH 27 (SUTURE) ×1 IMPLANT
SUT SILK 2-0 18XBRD TIE 12 (SUTURE) IMPLANT
SUT VICRYL 0 UR6 27IN ABS (SUTURE) IMPLANT
TOWEL OR 17X26 10 PK STRL BLUE (TOWEL DISPOSABLE) ×1 IMPLANT
WATER STERILE IRR 500ML POUR (IV SOLUTION) ×1 IMPLANT

## 2023-12-06 NOTE — Anesthesia Postprocedure Evaluation (Signed)
 Anesthesia Post Note  Patient: Ralph Stevens  Procedure(s) Performed: 3 COLUMN HEMORRHOIDECTOMY     Patient location during evaluation: PACU Anesthesia Type: MAC Level of consciousness: awake and alert Pain management: pain level controlled Vital Signs Assessment: post-procedure vital signs reviewed and stable Respiratory status: spontaneous breathing Cardiovascular status: stable Anesthetic complications: no   No notable events documented.  Last Vitals:  Vitals:   12/06/23 1345 12/06/23 1400  BP: 122/78 (!) 142/71  Pulse: 62 66  Resp: 15 14  Temp:  36.6 C  SpO2: 98% 99%    Last Pain:  Vitals:   12/06/23 1417  TempSrc:   PainSc: 3                  Gorman Laughter

## 2023-12-06 NOTE — H&P (Signed)
 REFERRING PHYSICIAN:  Nira Retort, NP   PROVIDER:  Elenora Gamma, MD   MRN: [id number] DOB: 31-Mar-1951   Subjective      History of Present Illness: Ralph Stevens is a 73 y.o. male who is seen today as an office consultation at the request of Gerrit Halls for evaluation of hemorrhoids .  Pt banded in the past with his GI office.  Underwent RBL in 2022, 23 and 24 for grade 3 hemorrhoids.  Last attempt was 4 months ago. He continue to have issues with prolapse and bleeding.        Review of Systems: A complete review of systems was obtained from the patient.  I have reviewed this information and discussed as appropriate with the patient.  See HPI as well for other ROS.     Medical History:     Past Medical History:  Diagnosis Date   Anxiety     Arthritis     GERD (gastroesophageal reflux disease)     Hypertension        There is no problem list on file for this patient.          Past Surgical History:  Procedure Laterality Date   TONSILLECTOMY              Allergies  Allergen Reactions   Gabapentin Hives            Current Outpatient Medications on File Prior to Visit  Medication Sig Dispense Refill   alfuzosin (UROXATRAL) 10 mg ER tablet Take 10 mg by mouth every evening       amLODIPine (NORVASC) 5 MG tablet Take 5 mg by mouth once daily       escitalopram oxalate (LEXAPRO) 10 MG tablet Take 1 tablet by mouth once daily       fluticasone propionate (FLONASE) 50 mcg/actuation nasal spray Place 1-2 sprays into one nostril       lisinopriL (ZESTRIL) 40 MG tablet Take 40 mg by mouth once daily       metoprolol TARTrate (LOPRESSOR) 50 MG tablet Take 1 tablet by mouth 2 (two) times daily       rosuvastatin (CRESTOR) 20 MG tablet Take 20 mg by mouth once daily       ibuprofen (MOTRIN) 200 MG tablet Take 600 mg by mouth        No current facility-administered medications on file prior to visit.           Family History  Problem Relation Age of Onset   High  blood pressure (Hypertension) Mother     Hyperlipidemia (Elevated cholesterol) Mother     Colon cancer Mother        Social History       Tobacco Use  Smoking Status Never  Smokeless Tobacco Never      Social History        Socioeconomic History   Marital status: Married  Tobacco Use   Smoking status: Never   Smokeless tobacco: Never  Vaping Use   Vaping status: Never Used  Substance and Sexual Activity   Alcohol use: Never   Drug use: Never    Social Drivers of Acupuncturist Strain: Low Risk  (09/26/2023)    Received from [facility]    Overall Financial Resource Strain (CARDIA)     Difficulty of Paying Living Expenses: Not hard at all  Food Insecurity: No Food Insecurity (09/26/2023)    Received  from [facility]    Hunger Vital Sign     Worried About Running Out of Food in the Last Year: Never true     Ran Out of Food in the Last Year: Never true  Transportation Needs: No Transportation Needs (09/26/2023)    Received from [facility] - Transportation     Lack of Transportation (Medical): No     Lack of Transportation (Non-Medical): No  Physical Activity: Insufficiently Active (09/26/2023)    Received from [facility]    Exercise Vital Sign     Days of Exercise per Week: 3 days     Minutes of Exercise per Session: 30 min  Stress: No Stress Concern Present (09/26/2023)    Received from [facility] - Occupational Stress Questionnaire     Feeling of Stress : Not at all  Social Connections: Moderately Isolated (09/26/2023)    Received from [facility]    Social Connection and Isolation Panel [NHANES]     Frequency of Communication with Friends and Family: More than three times a week     Frequency of Social Gatherings with Friends and Family: Three times a week     Attends Religious Services: Never     Active Member of Clubs or Organizations: No     Attends Banker Meetings: Never      Marital Status: Married  Housing Stability: Unknown (10/17/2023)    Housing Stability Vital Sign     Homeless in the Last Year: No      Objective:      Vitals:   12/06/23 1000  BP: (!) 158/79  Pulse: 72  Resp: 16  Temp: 97.8 F (36.6 C)  SpO2: 97%    Exam Gen: NAD Abd: soft Rectal: chronically inflamed skin tags, Grade 3 LL hem     Labs, Imaging and Diagnostic Testing:   Procedure: Anoscopy 10/17/2023 Surgeon: Andy Bannister After the risks and benefits were explained, written consent was obtained for above procedure.  A medical assistant chaperone was present thoroughout the entire procedure.  Anesthesia: none Diagnosis: rectal bleeding Findings: inflamed grade 3 LL hem, grade 1 RA and RP   Assessment and Plan:  Diagnoses and all orders for this visit:   Prolapsed internal hemorrhoids, grade 3       73 y.o. M with chronically inflamed external hemorrhoids with bleeding and prolapsed grade 3 LL hemorrhoid.  We discussed THD vs standard hemorrhoidectomy.  We decided to proceed with 3 column hemorrhoidectomy due to his significant external disease.  All questions were answered.  We discussed the significant pain associated with this surgery and the risk for urinary retention, as well as bleeding and a small change of stricture or recurrence.  Pt wishes to proceed with surgery.     Fernande Howells, MD Colon and Rectal Surgery [facility]

## 2023-12-06 NOTE — Transfer of Care (Signed)
 Immediate Anesthesia Transfer of Care Note  Patient: Ralph Stevens  Procedure(s) Performed: 3 COLUMN HEMORRHOIDECTOMY  Patient Location: PACU  Anesthesia Type:MAC  Level of Consciousness: awake, alert , and oriented  Airway & Oxygen Therapy: Patient Spontanous Breathing and Patient connected to face mask oxygen  Post-op Assessment: Report given to RN and Post -op Vital signs reviewed and stable  Post vital signs: Reviewed and stable  Last Vitals:  Vitals Value Taken Time  BP    Temp    Pulse 74 12/06/23 1305  Resp 16 12/06/23 1305  SpO2 93 % 12/06/23 1305  Vitals shown include unfiled device data.  Last Pain:  Vitals:   12/06/23 1053  TempSrc:   PainSc: 0-No pain         Complications: No notable events documented.

## 2023-12-06 NOTE — Discharge Instructions (Signed)
ANORECTAL SURGERY: POST OP INSTRUCTIONS ?Take your usually prescribed home medications unless otherwise directed. ?DIET: During the first few hours after surgery sip on some liquids until you are able to urinate.  It is normal to not urinate for several hours after this surgery.  If you feel uncomfortable, please contact the office for instructions.  After you are able to urinate,you may eat, if you feel like it.  Follow a light bland diet the first 24 hours after arrival home, such as soup, liquids, crackers, etc.  Be sure to include lots of fluids daily (6-8 glasses).  Avoid fast food or heavy meals, as your are more likely to get nauseated.  Eat a low fat diet the next few days after surgery.  Limit caffeine intake to 1-2 servings a day. ?PAIN CONTROL: ?Pain is best controlled by a usual combination of several different methods TOGETHER: ?Muscle relaxation ? Soak in a warm bath (or Sitz bath) three times a day and after bowel movements.  Continue to do this until all pain is resolved. ?Take the muscle relaxer (Valium) every 6 hours for the first 2 days after surgery  ?Over the counter pain medication ?Prescription pain medication ?Most patients will experience some swelling and discomfort in the anus/rectal area and incisions.  Heat such as warm towels, sitz baths, warm baths, etc to help relax tight/sore spots and speed recovery.  Some people prefer to use ice, especially in the first couple days after surgery, as it may decrease the pain and swelling, or alternate between ice & heat.  Experiment to what works for you.  Swelling and bruising can take several weeks to resolve.  Pain can take even longer to completely resolve. ?It is helpful to take an over-the-counter pain medication regularly for the first few weeks.  Choose one of the following that works best for you: ?Naproxen (Aleve, etc)  Two 220mg  tabs twice a day ?Ibuprofen (Advil, etc) Three 200mg  tabs four times a day (every meal & bedtime) ?A   prescription for pain medication (such as percocet, oxycodone, hydrocodone, etc) should be given to you upon discharge.  Take your pain medication as prescribed.  ?If you are having problems/concerns with the prescription medicine (does not control pain, nausea, vomiting, rash, itching, etc), please call us [phone] to see if we need to switch you to a different pain medicine that will work better for you and/or control your side effect better. ?If you need a refill on your pain medication, please contact your pharmacy.  They will contact our office to request authorization. Prescriptions will not be filled after 5 pm or on week-ends. ?KEEP YOUR BOWELS REGULAR and AVOID CONSTIPATION ?The goal is one to two soft bowel movements a day.  You should at least have a bowel movement every other day. ?Avoid getting constipated.  Between the surgery and the pain medications, it is common to experience some constipation. This can be very painful after rectal surgery.  Increasing fluid intake and taking a fiber supplement (such as Metamucil, Citrucel, FiberCon, etc) 1-2 times a day regularly will usually help prevent this problem from occurring.  A stool softener like colace is also recommended.  This can be purchased over the counter at your pharmacy.  You can take it up to 3 times a day.  If you do not have a bowel movement after 24 hrs since your surgery, take one does of milk of magnesia.  If you still haven't had a bowel movement 8-12 hours after  that dose, take another dose.  If you don't have a bowel movement 48 hrs after surgery, purchase a Fleets enema from the drug store and administer gently per package instructions.  If you still are having trouble with your bowel movements after that, please call the office for further instructions. ?If you develop diarrhea or have many loose bowel movements, simplify your diet to bland foods & liquids for a few days.  Stop any stool softeners and decrease your fiber  supplement.  Switching to mild anti-diarrheal medications (Kayopectate, Pepto Bismol) can help.  If this worsens or does not improve, please call us. ? ?Wound Care ?Remove your bandages before your first bowel movement or 8 hours after surgery.     ?Remove any wound packing material at this tim,e as well.  You do not need to repack the wound unless instructed otherwise.  Wear an absorbent pad or soft cotton gauze in your underwear to catch any drainage and help keep the area clean. You should change this every 2-3 hours while awake. ?Keep the area clean and dry.  Bathe / shower every day, especially after bowel movements.  Keep the area clean by showering / bathing over the incision / wound.   It is okay to soak an open wound to help wash it.  Wet wipes or showers / gentle washing after bowel movements is often less traumatic than regular toilet paper. ?You may have some styrofoam-like soft packing in the rectum which will come out with the first bowel movement.  ?You will often notice bleeding with bowel movements.  This should slow down by the end of the first week of surgery ?Expect some drainage.  This should slow down, too, by the end of the first week of surgery.  Wear an absorbent pad or soft cotton gauze in your underwear until the drainage stops. ?Do Not sit on a rubber or pillow ring.  This can make you symptoms worse.  You may sit on a soft pillow if needed.  ?ACTIVITIES as tolerated:   ?You may resume regular (light) daily activities beginning the next day--such as daily self-care, walking, climbing stairs--gradually increasing activities as tolerated.  If you can walk 30 minutes without difficulty, it is safe to try more intense activity such as jogging, treadmill, bicycling, low-impact aerobics, swimming, etc. ?Save the most intensive and strenuous activity for last such as sit-ups, heavy lifting, contact sports, etc  Refrain from any heavy lifting or straining until you are off narcotics for pain  control.   ?You may drive when you are no longer taking prescription pain medication, you can comfortably sit for long periods of time, and you can safely maneuver your car and apply brakes. ?You may have sexual intercourse when it is comfortable.  ?FOLLOW UP in our office ?Please call [facility] at [phone] to set up an appointment to see your surgeon in the office for a follow-up appointment approximately 3-4 weeks after your surgery. ?Make sure that you call for this appointment the day you arrive home to insure a convenient appointment time. ?10. IF YOU HAVE DISABILITY OR FAMILY LEAVE FORMS, BRING THEM TO THE OFFICE FOR PROCESSING.  DO NOT GIVE THEM TO YOUR DOCTOR. ? ? ? ? ?WHEN TO CALL us [phone]: ?Poor pain control ?Reactions / problems with new medications (rash/itching, nausea, etc)  ?Fever over 101.5 F (38.5 C) ?Inability to urinate ?Nausea and/or vomiting ?Worsening swelling or bruising ?Continued bleeding from incision. ?Increased pain, redness, or drainage from the  incision ? ?The clinic staff is available to answer your questions during regular business hours (8:30am-5pm).  Please don?t hesitate to call and ask to speak to one of our nurses for clinical concerns.   A surgeon from [facility] is always on call at the hospitals ?  ?If you have a medical emergency, go to the nearest emergency room or call 911. ?  ? ?[facility], [place] ?[address], [place], [place]  [address] ? ?MAIN: [phone] ? TOLL FREE: [phone] ? ?FAX [phone] ?[email or web address] ? ?  ?

## 2023-12-06 NOTE — Op Note (Signed)
 12/06/2023  12:56 PM  PATIENT:  Ralph Stevens  73 y.o. male  Patient Care Team: Eliodoro Guerin, DO as PCP - General (Family Medicine) Trent Frizzle, MD as Consulting Physician (Urology) Ronelle Coffee, MD as Consulting Physician (Ophthalmology) Denman Fischer, MD (Dermatology) Riley Cheadle, Windsor Hatcher, MD as Consulting Physician (Gastroenterology)  PRE-OPERATIVE DIAGNOSIS:  GRADE 3 HEMORRHOIDS  POST-OPERATIVE DIAGNOSIS:  GRADE 3 HEMORRHOIDS  PROCEDURE:  3 COLUMN HEMORRHOIDECTOMY    Surgeon(s): Joyce Nixon, MD  ASSISTANT: none   ANESTHESIA:   local and MAC  SPECIMEN:  Source of Specimen:  RA, RP and LL hemorrhoids  DISPOSITION OF SPECIMEN:  PATHOLOGY  COUNTS:  YES  PLAN OF CARE: Discharge to home after PACU  PATIENT DISPOSITION:  PACU - hemodynamically stable.  INDICATION: 73 y.o. M with rectal bleeding and chronic anal irritation   OR FINDINGS: Chronically inflamed internal and external heomrrhoids  DESCRIPTION: the patient was identified in the preoperative holding area and taken to the OR where they were laid on the operating room table.  MAC anesthesia was induced without difficulty. The patient was then positioned in prone jackknife position with buttocks gently taped apart.  The patient was then prepped and draped in usual sterile fashion.  SCDs were noted to be in place prior to the initiation of anesthesia. A surgical timeout was performed indicating the correct patient, procedure, positioning and need for preoperative antibiotics.  A rectal block was performed using Marcaine with epinephrine mixed with Experel.    I began with a digital rectal exam.  The anal canal was gently dilated.  I then placed a Hill-Ferguson anoscope into the anal canal and evaluated this completely.  The patient grade 3 internal hemorrhoids in all 3 locations.  I began in the right anterior position.  I elevated the sigmoid off of the sphincter complex using an Allis clamp.  An incision  was made using Metzenbaum scissors on the skin and carried down to the level of the sphincter complex.  The bluntly dissected all the tissue above the sphincter complex using the Metzenbaum scissors and divided this as we went.  Once down past the internal sphincter, I divided the remaining vessel with a hand-held harmonic device.  I then closed the rectal portion with a running 2-0 chromic suture to the dentate line.  I then cleared the remaining hemorrhoidal tissue away from the sphincter complex using Metzenbaum scissors.  I closed the external portion using a running 3-0 chromic suture.  I then turned my attention to the right posterior hemorrhoid and did the same procedure.  Hemostasis was good.  I then turned my attention to the left lateral hemorrhoid and divided and closed these in similar fashion.  Once all 3 were removed and sent to pathology, I evaluated the anal canal.  There was no sign of stricture noted.  Hemostasis was good.  Additional Exparel was placed around the incision sites for postoperative pain control.  The patient was then awakened from anesthesia and sent to the postanesthesia care unit in stable condition.  All counts were correct per operating room staff.  Fernande Howells, MD  Colorectal and General Surgery [facility]

## 2023-12-06 NOTE — Anesthesia Preprocedure Evaluation (Addendum)
 Anesthesia Evaluation  Patient identified by MRN, date of birth, ID band Patient awake    Reviewed: Allergy & Precautions, NPO status , Patient's Chart, lab work & pertinent test results  History of Anesthesia Complications (+) Family history of anesthesia reaction  Airway Mallampati: II  TM Distance: >3 FB Neck ROM: Full    Dental  (+) Dental Advisory Given, Missing   Pulmonary pneumonia   Pulmonary exam normal breath sounds clear to auscultation       Cardiovascular hypertension,  Rhythm:Regular Rate:Normal     Neuro/Psych    GI/Hepatic Neg liver ROS, hiatal hernia,GERD  ,,  Endo/Other  negative endocrine ROS    Renal/GU negative Renal ROS     Musculoskeletal  (+) Arthritis ,    Abdominal   Peds  Hematology negative hematology ROS (+)   Anesthesia Other Findings   Reproductive/Obstetrics                             Anesthesia Physical Anesthesia Plan  ASA: 2  Anesthesia Plan: MAC   Post-op Pain Management: Tylenol PO (pre-op)*   Induction: Intravenous  PONV Risk Score and Plan: 3 and TIVA, Propofol infusion, Treatment may vary due to age or medical condition, Ondansetron and Dexamethasone  Airway Management Planned: Natural Airway  Additional Equipment:   Intra-op Plan:   Post-operative Plan:   Informed Consent: I have reviewed the patients History and Physical, chart, labs and discussed the procedure including the risks, benefits and alternatives for the proposed anesthesia with the patient or authorized representative who has indicated his/her understanding and acceptance.     Dental advisory given  Plan Discussed with: CRNA  Anesthesia Plan Comments:         Anesthesia Quick Evaluation

## 2023-12-07 ENCOUNTER — Encounter (HOSPITAL_COMMUNITY): Payer: Self-pay | Admitting: General Surgery

## 2023-12-07 LAB — SURGICAL PATHOLOGY

## 2024-01-02 NOTE — Patient Instructions (Signed)
 SURGICAL WAITING ROOM VISITATION  Patients having surgery or a procedure may have no more than 2 support people in the waiting area - these visitors may rotate.    Children under the age of 23 must have an adult with them who is not the patient.  Due to an increase in RSV and influenza rates and associated hospitalizations, children ages 62 and under may not visit patients in [facility] hospitals.  Visitors with respiratory illnesses are discouraged from visiting and should remain at home.  If the patient needs to stay at the hospital during part of their recovery, the visitor guidelines for inpatient rooms apply. Pre-op nurse will coordinate an appropriate time for 1 support person to accompany patient in pre-op.  This support person may not rotate.    Please refer to the [facility] website for the visitor guidelines for Inpatients (after your surgery is over and you are in a regular room).       Your procedure is scheduled on:    Report to [facility] Main Entrance    Report to admitting at AM   Call this number if you have problems the morning of surgery [phone]   Do not eat food :After Midnight.   After Midnight you may have the following liquids until ______ AM/ PM DAY OF SURGERY  Water Non-Citrus Juices (without pulp, NO RED-Apple, White grape, White cranberry) Black Coffee (NO MILK/CREAM OR CREAMERS, sugar ok)  Clear Tea (NO MILK/CREAM OR CREAMERS, sugar ok) regular and decaf                             Plain Jell-O (NO RED)                                           Fruit ices (not with fruit pulp, NO RED)                                     Popsicles (NO RED)                                                               Sports drinks like Gatorade (NO RED)              Drink 2 Ensure/G2 drinks AT 10:00 PM the night before surgery.        The day of surgery:  Drink ONE (1) Pre-Surgery Clear Ensure or G2 at AM the morning of surgery. Drink in one  sitting. Do not sip.  This drink was given to you during your hospital  pre-op appointment visit. Nothing else to drink after completing the  Pre-Surgery Clear Ensure or G2.          If you have questions, please contact your surgeon's office.   FOLLOW BOWEL PREP AND ANY ADDITIONAL PRE OP INSTRUCTIONS YOU RECEIVED FROM YOUR SURGEON'S OFFICE!!!     Oral Hygiene is also important to reduce your risk of infection.  Remember - BRUSH YOUR TEETH THE MORNING OF SURGERY WITH YOUR REGULAR TOOTHPASTE  DENTURES WILL BE REMOVED PRIOR TO SURGERY PLEASE DO NOT APPLY "Poly grip" OR ADHESIVES!!!   Do NOT smoke after Midnight   Stop all vitamins and herbal supplements 7 days before surgery.   Take these medicines the morning of surgery with A SIP OF WATER:   DO NOT TAKE ANY ORAL DIABETIC MEDICATIONS DAY OF YOUR SURGERY  Bring CPAP mask and tubing day of surgery.                              You may not have any metal on your body including hair pins, jewelry, and body piercing             Do not wear make-up, lotions, powders, perfumes/cologne, or deodorant  Do not wear nail polish including gel and S&S, artificial/acrylic nails, or any other type of covering on natural nails including finger and toenails. If you have artificial nails, gel coating, etc. that needs to be removed by a nail salon please have this removed prior to surgery or surgery may need to be canceled/ delayed if the surgeon/ anesthesia feels like they are unable to be safely monitored.   Do not shave  48 hours prior to surgery.               Men may shave face and neck.   Do not bring valuables to the hospital. [facility] IS NOT             RESPONSIBLE   FOR VALUABLES.   Contacts, glasses, dentures or bridgework may not be worn into surgery.   Bring small overnight bag day of surgery.   DO NOT BRING YOUR HOME MEDICATIONS TO THE HOSPITAL. PHARMACY WILL DISPENSE MEDICATIONS LISTED ON  YOUR MEDICATION LIST TO YOU DURING YOUR ADMISSION IN THE HOSPITAL!    Patients discharged on the day of surgery will not be allowed to drive home.  Someone NEEDS to stay with you for the first 24 hours after anesthesia.   Special Instructions: Bring a copy of your healthcare power of attorney and living will documents the day of surgery if you haven't scanned them before.              Please read over the following fact sheets you were given: IF YOU HAVE QUESTIONS ABOUT YOUR PRE-OP INSTRUCTIONS PLEASE CALL [phone]   If you received a COVID test during your pre-op visit  it is requested that you wear a mask when out in public, stay away from anyone that may not be feeling well and notify your surgeon if you develop symptoms. If you test positive for Covid or have been in contact with anyone that has tested positive in the last 10 days please notify you surgeon.      Pre-operative 5 CHG Bath Instructions   You can play a key role in reducing the risk of infection after surgery. Your skin needs to be as free of germs as possible. You can reduce the number of germs on your skin by washing with CHG (chlorhexidine gluconate) soap before surgery. CHG is an antiseptic soap that kills germs and continues to kill germs even after washing.   DO NOT use if you have an allergy to chlorhexidine/CHG or antibacterial soaps. If your skin becomes reddened or irritated, stop using the CHG and notify one of our RNs at [phone].  Please shower with the CHG soap starting 4 days before surgery using the following schedule:     Please keep in mind the following:  DO NOT shave, including legs and underarms, starting the day of your first shower.   You may shave your face at any point before/day of surgery.  Place clean sheets on your bed the day you start using CHG soap. Use a clean washcloth (not used since being washed) for each shower. DO NOT sleep with pets once you start using the CHG.   CHG  Shower Instructions:  If you choose to wash your hair and private area, wash first with your normal shampoo/soap.  After you use shampoo/soap, rinse your hair and body thoroughly to remove shampoo/soap residue.  Turn the water OFF and apply about 3 tablespoons (45 ml) of CHG soap to a CLEAN washcloth.  Apply CHG soap ONLY FROM YOUR NECK DOWN TO YOUR TOES (washing for 3-5 minutes)  DO NOT use CHG soap on face, private areas, open wounds, or sores.  Pay special attention to the area where your surgery is being performed.  If you are having back surgery, having someone wash your back for you may be helpful. Wait 2 minutes after CHG soap is applied, then you may rinse off the CHG soap.  Pat dry with a clean towel  Put on clean clothes/pajamas   If you choose to wear lotion, please use ONLY the CHG-compatible lotions on the back of this paper.     Additional instructions for the day of surgery: DO NOT APPLY any lotions, deodorants, cologne, or perfumes.   Put on clean/comfortable clothes.  Brush your teeth.  Ask your nurse before applying any prescription medications to the skin.      CHG Compatible Lotions   Aveeno Moisturizing lotion  Cetaphil Moisturizing Cream  Cetaphil Moisturizing Lotion  Clairol Herbal Essence Moisturizing Lotion, Dry Skin  Clairol Herbal Essence Moisturizing Lotion, Extra Dry Skin  Clairol Herbal Essence Moisturizing Lotion, Normal Skin  Curel Age Defying Therapeutic Moisturizing Lotion with Alpha Hydroxy  Curel Extreme Care Body Lotion  Curel Soothing Hands Moisturizing Hand Lotion  Curel Therapeutic Moisturizing Cream, Fragrance-Free  Curel Therapeutic Moisturizing Lotion, Fragrance-Free  Curel Therapeutic Moisturizing Lotion, Original Formula  Eucerin Daily Replenishing Lotion  Eucerin Dry Skin Therapy Plus Alpha Hydroxy Crme  Eucerin Dry Skin Therapy Plus Alpha Hydroxy Lotion  Eucerin Original Crme  Eucerin Original Lotion  Eucerin Plus Crme  Eucerin Plus Lotion  Eucerin TriLipid Replenishing Lotion  Keri Anti-Bacterial Hand Lotion  Keri Deep Conditioning Original Lotion Dry Skin Formula Softly Scented  Keri Deep Conditioning Original Lotion, Fragrance Free Sensitive Skin Formula  Keri Lotion Fast Absorbing Fragrance Free Sensitive Skin Formula  Keri Lotion Fast Absorbing Softly Scented Dry Skin Formula  Keri Original Lotion  Keri Skin Renewal Lotion Keri Silky Smooth Lotion  Keri Silky Smooth Sensitive Skin Lotion  Nivea Body Creamy Conditioning Oil  Nivea Body Extra Enriched Lotion  Nivea Body Original Lotion  Nivea Body Sheer Moisturizing Lotion Nivea Crme  Nivea Skin Firming Lotion  NutraDerm 30 Skin Lotion  NutraDerm Skin Lotion  NutraDerm Therapeutic Skin Cream  NutraDerm Therapeutic Skin Lotion  ProShield Protective Hand Cream  Provon moisturizing lotion   Incentive Spirometer  An incentive spirometer is a tool that can help keep your lungs clear and active. This tool measures how well you are filling your lungs with each breath. Taking long deep breaths may help reverse or decrease the chance of developing  breathing (pulmonary) problems (especially infection) following: A long period of time when you are unable to move or be active. BEFORE THE PROCEDURE  If the spirometer includes an indicator to show your best effort, your nurse or respiratory therapist will set it to a desired goal. If possible, sit up straight or lean slightly forward. Try not to slouch. Hold the incentive spirometer in an upright position. INSTRUCTIONS FOR USE  Sit on the edge of your bed if possible, or sit up as far as you can in bed or on a chair. Hold the incentive spirometer in an upright position. Breathe out normally. Place the mouthpiece in your mouth and seal your lips tightly around it. Breathe in slowly and as deeply as possible, raising the piston or the ball toward the top of the column. Hold your breath for 3-5 seconds or for  as long as possible. Allow the piston or ball to fall to the bottom of the column. Remove the mouthpiece from your mouth and breathe out normally. Rest for a few seconds and repeat Steps 1 through 7 at least 10 times every 1-2 hours when you are awake. Take your time and take a few normal breaths between deep breaths. The spirometer may include an indicator to show your best effort. Use the indicator as a goal to work toward during each repetition. After each set of 10 deep breaths, practice coughing to be sure your lungs are clear. If you have an incision (the cut made at the time of surgery), support your incision when coughing by placing a pillow or rolled up towels firmly against it. Once you are able to get out of bed, walk around indoors and cough well. You may stop using the incentive spirometer when instructed by your caregiver.  RISKS AND COMPLICATIONS Take your time so you do not get dizzy or light-headed. If you are in pain, you may need to take or ask for pain medication before doing incentive spirometry. It is harder to take a deep breath if you are having pain. AFTER USE Rest and breathe slowly and easily. It can be helpful to keep track of a log of your progress. Your caregiver can provide you with a simple table to help with this. If you are using the spirometer at home, follow these instructions: SEEK MEDICAL CARE IF:  You are having difficultly using the spirometer. You have trouble using the spirometer as often as instructed. Your pain medication is not giving enough relief while using the spirometer. You develop fever of 100.5 F (38.1 C) or higher. SEEK IMMEDIATE MEDICAL CARE IF:  You cough up bloody sputum that had not been present before. You develop fever of 102 F (38.9 C) or greater. You develop worsening pain at or near the incision site. MAKE SURE YOU:  Understand these instructions. Will watch your condition. Will get help right away if you are not doing well  or get worse. Document Released: 12/19/2006 Document Revised: 10/31/2011 Document Reviewed: 02/19/2007 [facility] Patient Information 2014 [place], [place].   ________________________________________________________________________

## 2024-01-02 NOTE — Progress Notes (Signed)
 COVID Vaccine Completed: yes  Date of COVID positive in last 90 days:  PCP - Vicky Grange, DO Cardiologist -   Medical clearance by Vicky Grange, DO 01/16/24 in media tab  Chest x-ray -  EKG - 11/21/23 Epic Stress Test -  ECHO -  Cardiac Cath -  Pacemaker/ICD device last checked: Spinal Cord Stimulator:  Bowel Prep -   Sleep Study -  CPAP -   Fasting Blood Sugar -  Checks Blood Sugar _____ times a day  Last dose of GLP1 agonist-  N/A GLP1 instructions:  Hold 7 days before surgery    Last dose of SGLT-2 inhibitors-  N/A SGLT-2 instructions:  Hold 3 days before surgery    Blood Thinner Instructions:  Last dose:   Time: Aspirin  Instructions: Last Dose:  Activity level:  Can go up a flight of stairs and perform activities of daily living without stopping and without symptoms of chest pain or shortness of breath.  Able to exercise without symptoms  Unable to go up a flight of stairs without symptoms of     Anesthesia review:   Patient denies shortness of breath, fever, cough and chest pain at PAT appointment  Patient verbalized understanding of instructions that were given to them at the PAT appointment. Patient was also instructed that they will need to review over the PAT instructions again at home before surgery.

## 2024-01-03 ENCOUNTER — Other Ambulatory Visit: Payer: Self-pay

## 2024-01-03 ENCOUNTER — Encounter (HOSPITAL_COMMUNITY): Payer: Self-pay

## 2024-01-03 ENCOUNTER — Encounter (HOSPITAL_COMMUNITY)
Admission: RE | Admit: 2024-01-03 | Discharge: 2024-01-03 | Disposition: A | Discharge status: Home / Self Care | Source: Ambulatory Visit | Attending: Orthopedic Surgery | Admitting: Orthopedic Surgery

## 2024-01-03 DIAGNOSIS — I10 Essential (primary) hypertension: Secondary | ICD-10-CM | POA: Insufficient documentation

## 2024-01-03 DIAGNOSIS — Z01812 Encounter for preprocedural laboratory examination: Secondary | ICD-10-CM | POA: Insufficient documentation

## 2024-01-03 DIAGNOSIS — Z01818 Encounter for other preprocedural examination: Secondary | ICD-10-CM

## 2024-01-03 HISTORY — DX: Sepsis, unspecified organism: A41.9

## 2024-01-03 LAB — CBC
HCT: 44.8 % (ref 39.0–52.0)
Hemoglobin: 14.3 g/dL (ref 13.0–17.0)
MCH: 28.5 pg (ref 26.0–34.0)
MCHC: 31.9 g/dL (ref 30.0–36.0)
MCV: 89.4 fL (ref 80.0–100.0)
Platelets: 251 10*3/uL (ref 150–400)
RBC: 5.01 MIL/uL (ref 4.22–5.81)
RDW: 14.6 % (ref 11.5–15.5)
WBC: 7.2 10*3/uL (ref 4.0–10.5)
nRBC: 0 % (ref 0.0–0.2)

## 2024-01-03 LAB — BASIC METABOLIC PANEL WITH GFR
Anion gap: 7 (ref 5–15)
BUN: 11 mg/dL (ref 8–23)
CO2: 25 mmol/L (ref 22–32)
Calcium: 9.1 mg/dL (ref 8.9–10.3)
Chloride: 107 mmol/L (ref 98–111)
Creatinine, Ser: 0.87 mg/dL (ref 0.61–1.24)
GFR, Estimated: >60 mL/min (ref >60)
Glucose, Bld: 104 mg/dL — ABNORMAL HIGH (ref 70–99)
Potassium: 4 mmol/L (ref 3.5–5.1)
Sodium: 139 mmol/L (ref 135–145)

## 2024-01-03 LAB — SURGICAL PCR SCREEN
MRSA, PCR: NEGATIVE
Staphylococcus aureus: POSITIVE — AB

## 2024-01-03 NOTE — Patient Instructions (Signed)
 SURGICAL WAITING ROOM VISITATION  Patients having surgery or a procedure may have no more than 2 support people in the waiting area - these visitors may rotate.    Children under the age of 49 must have an adult with them who is not the patient.  Visitors with respiratory illnesses are discouraged from visiting and should remain at home.  If the patient needs to stay at the hospital during part of their recovery, the visitor guidelines for inpatient rooms apply. Pre-op nurse will coordinate an appropriate time for 1 support person to accompany patient in pre-op.  This support person may not rotate.    Please refer to the [facility] website for the visitor guidelines for Inpatients (after your surgery is over and you are in a regular room).       Your procedure is scheduled on: 01/16/24   Report to [facility] Main Entrance    Report to admitting at 5:15 AM   Call this number if you have problems the morning of surgery [phone]   Do not eat food :After Midnight.   After Midnight you may have the following liquids until 4:15 AM DAY OF SURGERY  Water Non-Citrus Juices (without pulp, NO RED-Apple, White grape, White cranberry) Black Coffee (NO MILK/CREAM OR CREAMERS, sugar ok)  Clear Tea (NO MILK/CREAM OR CREAMERS, sugar ok) regular and decaf                             Plain Jell-O (NO RED)                                           Fruit ices (not with fruit pulp, NO RED)                                     Popsicles (NO RED)                                                               Sports drinks like Gatorade (NO RED)               The day of surgery:  Drink ONE (1) Pre-Surgery Clear Ensure at 4:15 AM the morning of surgery. Drink in one sitting. Do not sip.  This drink was given to you during your hospital  pre-op appointment visit. Nothing else to drink after completing the  Pre-Surgery Clear Ensure.          If you have questions, please contact your  surgeon's office.   FOLLOW BOWEL PREP AND ANY ADDITIONAL PRE OP INSTRUCTIONS YOU RECEIVED FROM YOUR SURGEON'S OFFICE!!!     Oral Hygiene is also important to reduce your risk of infection.                                    Remember - BRUSH YOUR TEETH THE MORNING OF SURGERY WITH YOUR REGULAR TOOTHPASTE  DENTURES WILL BE REMOVED PRIOR TO SURGERY PLEASE DO NOT APPLY "Poly grip"  OR ADHESIVES!!!   Stop all vitamins and herbal supplements 7 days before surgery.   Take these medicines the morning of surgery with A SIP OF WATER: Tylenol , Amlodipine , Escitalopram , Flonase , Metoprolol , Nexium                              You may not have any metal on your body including hair pins, jewelry, and body piercing             Do not wear lotions, powders, cologne, or deodorant              Men may shave face and neck.   Do not bring valuables to the hospital. [facility] IS NOT             RESPONSIBLE   FOR VALUABLES.   Contacts, glasses, dentures or bridgework may not be worn into surgery.   Bring small overnight bag day of surgery.   DO NOT BRING YOUR HOME MEDICATIONS TO THE HOSPITAL. PHARMACY WILL DISPENSE MEDICATIONS LISTED ON YOUR MEDICATION LIST TO YOU DURING YOUR ADMISSION IN THE HOSPITAL!    Special Instructions: Bring a copy of your healthcare power of attorney and living will documents the day of surgery if you haven't scanned them before.              Please read over the following fact sheets you were given: IF YOU HAVE QUESTIONS ABOUT YOUR PRE-OP INSTRUCTIONS PLEASE CALL [name]   If you received a COVID test during your pre-op visit  it is requested that you wear a mask when out in public, stay away from anyone that may not be feeling well and notify your surgeon if you develop symptoms. If you test positive for Covid or have been in contact with anyone that has tested positive in the last 10 days please notify you surgeon.      Pre-operative 5 CHG Bath Instructions    You can play a key role in reducing the risk of infection after surgery. Your skin needs to be as free of germs as possible. You can reduce the number of germs on your skin by washing with CHG (chlorhexidine  gluconate) soap before surgery. CHG is an antiseptic soap that kills germs and continues to kill germs even after washing.   DO NOT use if you have an allergy to chlorhexidine /CHG or antibacterial soaps. If your skin becomes reddened or irritated, stop using the CHG and notify one of our RNs at [phone].   Please shower with the CHG soap starting 4 days before surgery using the following schedule:     Please keep in mind the following:  DO NOT shave, including legs and underarms, starting the day of your first shower.   You may shave your face at any point before/day of surgery.  Place clean sheets on your bed the day you start using CHG soap. Use a clean washcloth (not used since being washed) for each shower. DO NOT sleep with pets once you start using the CHG.   CHG Shower Instructions:  If you choose to wash your hair and private area, wash first with your normal shampoo/soap.  After you use shampoo/soap, rinse your hair and body thoroughly to remove shampoo/soap residue.  Turn the water OFF and apply about 3 tablespoons (45 ml) of CHG soap to a CLEAN washcloth.  Apply CHG soap ONLY FROM YOUR NECK DOWN TO YOUR TOES (washing for 3-5  minutes)  DO NOT use CHG soap on face, private areas, open wounds, or sores.  Pay special attention to the area where your surgery is being performed.  If you are having back surgery, having someone wash your back for you may be helpful. Wait 2 minutes after CHG soap is applied, then you may rinse off the CHG soap.  Pat dry with a clean towel  Put on clean clothes/pajamas   If you choose to wear lotion, please use ONLY the CHG-compatible lotions on the back of this paper.     Additional instructions for the day of surgery: DO NOT APPLY any  lotions, deodorants, cologne, or perfumes.   Put on clean/comfortable clothes.  Brush your teeth.  Ask your nurse before applying any prescription medications to the skin.      CHG Compatible Lotions   Aveeno Moisturizing lotion  Cetaphil Moisturizing Cream  Cetaphil Moisturizing Lotion  Clairol Herbal Essence Moisturizing Lotion, Dry Skin  Clairol Herbal Essence Moisturizing Lotion, Extra Dry Skin  Clairol Herbal Essence Moisturizing Lotion, Normal Skin  Curel Age Defying Therapeutic Moisturizing Lotion with Alpha Hydroxy  Curel Extreme Care Body Lotion  Curel Soothing Hands Moisturizing Hand Lotion  Curel Therapeutic Moisturizing Cream, Fragrance-Free  Curel Therapeutic Moisturizing Lotion, Fragrance-Free  Curel Therapeutic Moisturizing Lotion, Original Formula  Eucerin Daily Replenishing Lotion  Eucerin Dry Skin Therapy Plus Alpha Hydroxy Crme  Eucerin Dry Skin Therapy Plus Alpha Hydroxy Lotion  Eucerin Original Crme  Eucerin Original Lotion  Eucerin Plus Crme Eucerin Plus Lotion  Eucerin TriLipid Replenishing Lotion  Keri Anti-Bacterial Hand Lotion  Keri Deep Conditioning Original Lotion Dry Skin Formula Softly Scented  Keri Deep Conditioning Original Lotion, Fragrance Free Sensitive Skin Formula  Keri Lotion Fast Absorbing Fragrance Free Sensitive Skin Formula  Keri Lotion Fast Absorbing Softly Scented Dry Skin Formula  Keri Original Lotion  Keri Skin Renewal Lotion Keri Silky Smooth Lotion  Keri Silky Smooth Sensitive Skin Lotion  Nivea Body Creamy Conditioning Oil  Nivea Body Extra Enriched Lotion  Nivea Body Original Lotion  Nivea Body Sheer Moisturizing Lotion Nivea Crme  Nivea Skin Firming Lotion  NutraDerm 30 Skin Lotion  NutraDerm Skin Lotion  NutraDerm Therapeutic Skin Cream  NutraDerm Therapeutic Skin Lotion  ProShield Protective Hand Cream  Provon moisturizing lotion   Incentive Spirometer  An incentive spirometer is a tool that can help keep  your lungs clear and active. This tool measures how well you are filling your lungs with each breath. Taking long deep breaths may help reverse or decrease the chance of developing breathing (pulmonary) problems (especially infection) following: A long period of time when you are unable to move or be active. BEFORE THE PROCEDURE  If the spirometer includes an indicator to show your best effort, your nurse or respiratory therapist will set it to a desired goal. If possible, sit up straight or lean slightly forward. Try not to slouch. Hold the incentive spirometer in an upright position. INSTRUCTIONS FOR USE  Sit on the edge of your bed if possible, or sit up as far as you can in bed or on a chair. Hold the incentive spirometer in an upright position. Breathe out normally. Place the mouthpiece in your mouth and seal your lips tightly around it. Breathe in slowly and as deeply as possible, raising the piston or the ball toward the top of the column. Hold your breath for 3-5 seconds or for as long as possible. Allow the piston or ball to fall to the bottom  of the column. Remove the mouthpiece from your mouth and breathe out normally. Rest for a few seconds and repeat Steps 1 through 7 at least 10 times every 1-2 hours when you are awake. Take your time and take a few normal breaths between deep breaths. The spirometer may include an indicator to show your best effort. Use the indicator as a goal to work toward during each repetition. After each set of 10 deep breaths, practice coughing to be sure your lungs are clear. If you have an incision (the cut made at the time of surgery), support your incision when coughing by placing a pillow or rolled up towels firmly against it. Once you are able to get out of bed, walk around indoors and cough well. You may stop using the incentive spirometer when instructed by your caregiver.  RISKS AND COMPLICATIONS Take your time so you do not get dizzy or  light-headed. If you are in pain, you may need to take or ask for pain medication before doing incentive spirometry. It is harder to take a deep breath if you are having pain. AFTER USE Rest and breathe slowly and easily. It can be helpful to keep track of a log of your progress. Your caregiver can provide you with a simple table to help with this. If you are using the spirometer at home, follow these instructions: SEEK MEDICAL CARE IF:  You are having difficultly using the spirometer. You have trouble using the spirometer as often as instructed. Your pain medication is not giving enough relief while using the spirometer. You develop fever of 100.5 F (38.1 C) or higher. SEEK IMMEDIATE MEDICAL CARE IF:  You cough up bloody sputum that had not been present before. You develop fever of 102 F (38.9 C) or greater. You develop worsening pain at or near the incision site. MAKE SURE YOU:  Understand these instructions. Will watch your condition. Will get help right away if you are not doing well or get worse. Document Released: 12/19/2006 Document Revised: 10/31/2011 Document Reviewed: 02/19/2007 [facility] Patient Information 2014 [place], [place].   ________________________________________________________________________

## 2024-01-03 NOTE — Progress Notes (Signed)
  COVID Vaccine Completed: yes   Date of COVID positive in last 90 days:   PCP - Vicky Grange, DO Cardiologist -  n/a   Medical clearance by Vicky Grange, DO 01/16/24 in media tab   Chest x-ray - n/a EKG - 11/21/23 Epic Stress Test - yes, more than 10 years ago per pt ECHO - n/a Cardiac Cath - n/a Pacemaker/ICD device last checked: n/a Spinal Cord Stimulator: n/a   Bowel Prep - no   Sleep Study - n/a CPAP -    Fasting Blood Sugar - n/a Checks Blood Sugar _____ times a day   Last dose of GLP1 agonist-  N/A GLP1 instructions:  Hold 7 days before surgery    Last dose of SGLT-2 inhibitors-  N/A SGLT-2 instructions:  Hold 3 days before surgery      Blood Thinner Instructions:  n/a Aspirin  Instructions: Last Dose:   Activity level:   Can go up a flight of stairs and perform activities of daily living without stopping and without symptoms of chest pain or shortness of breath.                Anesthesia review:    Patient denies shortness of breath, fever, cough and chest pain at PAT appointment   Patient verbalized understanding of instructions that were given to them at the PAT appointment. Patient was also instructed that they will need to review over the PAT instructions again at home before surgery.

## 2024-01-05 NOTE — Progress Notes (Signed)
[facility]+ results routed to Dr. Olin. 

## 2024-01-14 NOTE — H&P (Signed)
 TOTAL KNEE ADMISSION H&P  Patient is being admitted for left total knee arthroplasty.  Therapy Plans: outpatient therapy at [facility] in [place] Disposition: Home with wife and son Planned DVT Prophylaxis: aspirin  81mg  BID DME needed: walker PCP: Vicky Grange - clearance received TXA: IV Allergies: gabapentin  - hives Anesthesia Concerns: none BMI: 25.9 Last HgbA1c: Not diabetic   Other: - Hoping for SDD - Will start with tramadol  (will switch to oxy if needed, but he recalls this making him very loopy), robaxin , tylenol , celebrex  - no hx of VTE or cancer - recent hemorrhoid surgery with Dr. Andy Bannister   Subjective:  Chief Complaint:left knee pain.  HPI: Ralph Stevens, 73 y.o. male, has a history of pain and functional disability in the left knee due to arthritis and has failed non-surgical conservative treatments for greater than 12 weeks to includeNSAID's and/or analgesics and corticosteriod injections.  Onset of symptoms was gradual, starting 2 years ago with gradually worsening course since that time. The patient noted no past surgery on the left knee(s).  Patient currently rates pain in the left knee(s) at 8 out of 10 with activity. Patient has worsening of pain with activity and weight bearing and pain that interferes with activities of daily living.  Patient has evidence of joint space narrowing by imaging studies. There is no active infection.  Patient Active Problem List   Diagnosis Date Noted   Prolapsed internal hemorrhoids, grade 3 08/23/2022   Internal hemorrhoids 07/21/2022   Family history of colon cancer 07/05/2021   Hiatal hernia 10/22/2020   Rectal bleeding 10/22/2020   Hospital discharge follow-up 01/10/2020   Sepsis secondary to UTI (HCC) 01/01/2020   GERD (gastroesophageal reflux disease)    Hyperlipidemia    Hypokalemia    Nodular prostate 08/07/2019   Benign prostatic hyperplasia with urinary frequency 08/07/2019   White coat syndrome with diagnosis  of hypertension 08/07/2019   History of retinal tear 08/07/2019   Actinic keratoses 08/07/2019   Past Medical History:  Diagnosis Date   Acid reflux    Arthritis    Arthritis    Cataract    Mixed form OU   Enlarged prostate    PSA goes up and down    Family history of adverse reaction to anesthesia    sister has had trouble waking up   GERD (gastroesophageal reflux disease)    High cholesterol    Hyperlipidemia    Hypertension    Hypertensive retinopathy    OU   Pneumonia    Sepsis (HCC)     Past Surgical History:  Procedure Laterality Date   colonoscopy  2019   internal hemorrhoids and pancolonic diverticulosis. Surveillance due in 2024 due to history of adenomatous colon polyps and family history.   COLONOSCOPY N/A 06/23/2021   Hemorrhoids on perianal exam. 2 small 4 hemorrhoid tags, minimal internla hemorrhoids, scars appropriately located. Scattered medium-mouthed diverticula in entire colon.   EYE SURGERY     HEMORRHOID SURGERY N/A 12/06/2023   Procedure: 3 COLUMN HEMORRHOIDECTOMY;  Surgeon: Joyce Nixon, MD;  Location: [facility] ORS;  Service: General;  Laterality: N/A;   TONSILLECTOMY     age 73     No current facility-administered medications for this encounter.   Current Outpatient Medications  Medication Sig Dispense Refill Last Dose/Taking   acetaminophen  (TYLENOL ) 500 MG tablet Take 500 mg by mouth daily as needed for moderate pain (pain score 4-6).   Taking As Needed   alfuzosin  (UROXATRAL ) 10 MG 24 hr tablet Take 1  tablet (10 mg total) by mouth every evening. 90 tablet 3 Taking   amLODipine  (NORVASC ) 5 MG tablet Take 1 tablet (5 mg total) by mouth daily. 90 tablet 3 Taking   escitalopram  (LEXAPRO ) 10 MG tablet Take 1 tablet (10 mg total) by mouth daily. 90 tablet 3 Taking   fluticasone  (FLONASE ) 50 MCG/ACT nasal spray Place 1-2 sprays into both nostrils daily as needed for allergies or rhinitis. 48 mL 3 Taking As Needed   lisinopril  (ZESTRIL ) 40 MG tablet Take 1  tablet (40 mg total) by mouth daily. 90 tablet 3 Taking   metoprolol  tartrate (LOPRESSOR ) 50 MG tablet Take 1 tablet (50 mg total) by mouth 2 (two) times daily. 180 tablet 3 Taking   rosuvastatin  (CRESTOR ) 20 MG tablet Take 1 tablet (20 mg total) by mouth daily. (Patient taking differently: Take 20 mg by mouth at bedtime.) 90 tablet 3 Taking Differently   diazepam  (VALIUM ) 5 MG tablet Take 1 tablet (5 mg total) by mouth every 6 (six) hours as needed for muscle spasms (inability to urinate). (Patient not taking: Reported on 12/27/2023) 20 tablet 0 Not Taking   esomeprazole (NEXIUM) 20 MG capsule Take 20 mg by mouth daily.      traMADol  (ULTRAM ) 50 MG tablet Take 1-2 tablets (50-100 mg total) by mouth every 6 (six) hours as needed. (Patient not taking: Reported on 12/27/2023) 30 tablet 0 Not Taking   Allergies  Allergen Reactions   Gabapentin  Hives    Social History   Tobacco Use   Smoking status: Never   Smokeless tobacco: Never  Substance Use Topics   Alcohol use: Never    Family History  Problem Relation Age of Onset   Cancer Mother    Colon cancer Mother    Dementia Mother    Alzheimer's disease Mother    Emphysema Father    Hypothyroidism Sister    Thyroid  disease Sister    Hypertension Sister    Multiple sclerosis Daughter    Heart defect Daughter    Colon cancer Maternal Grandmother    Heart disease Maternal Grandfather    Heart disease Paternal Grandmother    Cancer Paternal Grandfather        unknown ( mouth/throat)   Colon cancer Maternal Uncle      Review of Systems  Constitutional:  Negative for chills and fever.  Respiratory:  Negative for cough and shortness of breath.   Cardiovascular:  Negative for chest pain.  Gastrointestinal:  Negative for nausea and vomiting.  Musculoskeletal:  Positive for arthralgias.     Objective:  Physical Exam Well nourished and well developed. General: Alert and oriented x3, cooperative and pleasant, no acute distress. Head:  normocephalic, atraumatic, neck supple.  Musculoskeletal: Left knee exam: No palpable effusion, warmth erythema No significant flexion contracture Flexion to 120 degrees with tightness and crepitation over the anterior aspect knee Tenderness anteriorly Normal ipsilateral left hip exam without groin pain or referred pain No lower extremity edema, erythema or calf tenderness Right knee exam reveals mild tenderness anteriorly with full knee extension and flexion    Vital signs in last 24 hours:    Labs:   Estimated body mass index is 24.14 kg/m as calculated from the following:   Height as of 01/03/24: 6' (1.829 m).   Weight as of 01/03/24: 80.7 kg.   Imaging Review Plain radiographs demonstrate severe degenerative joint disease of the left knee(s). The overall alignment isneutral. The bone quality appears to be adequate for age and reported  activity level.      Assessment/Plan:  End stage arthritis, left knee   The patient history, physical examination, clinical judgment of the provider and imaging studies are consistent with end stage degenerative joint disease of the left knee(s) and total knee arthroplasty is deemed medically necessary. The treatment options including medical management, injection therapy arthroscopy and arthroplasty were discussed at length. The risks and benefits of total knee arthroplasty were presented and reviewed. The risks due to aseptic loosening, infection, stiffness, patella tracking problems, thromboembolic complications and other imponderables were discussed. The patient acknowledged the explanation, agreed to proceed with the plan and consent was signed. Patient is being admitted for inpatient treatment for surgery, pain control, PT, OT, prophylactic antibiotics, VTE prophylaxis, progressive ambulation and ADL's and discharge planning. The patient is planning to be discharged home.    Kim Pen, PA-C Orthopedic Surgery [facility]  [facility] [phone]

## 2024-01-15 NOTE — Anesthesia Preprocedure Evaluation (Signed)
 Anesthesia Evaluation  Patient identified by MRN, date of birth, ID band Patient awake    Reviewed: Allergy & Precautions, NPO status , Patient's Chart, lab work & pertinent test results, reviewed documented beta blocker date and time   History of Anesthesia Complications Negative for: history of anesthetic complications  Airway Mallampati: II  TM Distance: >3 FB Neck ROM: Full    Dental  (+) Dental Advisory Given   Pulmonary neg pulmonary ROS   breath sounds clear to auscultation       Cardiovascular hypertension, Pt. on medications and Pt. on home beta blockers (-) angina  Rhythm:Regular Rate:Normal     Neuro/Psych negative neurological ROS     GI/Hepatic Neg liver ROS, hiatal hernia,GERD  Medicated and Controlled,,  Endo/Other  negative endocrine ROS    Renal/GU negative Renal ROS     Musculoskeletal  (+) Arthritis ,    Abdominal   Peds  Hematology Hb 14.3, plt 251k   Anesthesia Other Findings   Reproductive/Obstetrics                             Anesthesia Physical Anesthesia Plan  ASA: 2  Anesthesia Plan: Spinal   Post-op Pain Management: Regional block* and Tylenol  PO (pre-op)*   Induction:   PONV Risk Score and Plan: 1 and Treatment may vary due to age or medical condition  Airway Management Planned: Natural Airway and Simple Face Mask  Additional Equipment: None  Intra-op Plan:   Post-operative Plan:   Informed Consent: I have reviewed the patients History and Physical, chart, labs and discussed the procedure including the risks, benefits and alternatives for the proposed anesthesia with the patient or authorized representative who has indicated his/her understanding and acceptance.     Dental advisory given  Plan Discussed with: CRNA and Surgeon  Anesthesia Plan Comments: (Plan SAB with adductor canal block for post op analgesia)        Anesthesia Quick  Evaluation

## 2024-01-16 ENCOUNTER — Ambulatory Visit (HOSPITAL_COMMUNITY)
Admission: RE | Admit: 2024-01-16 | Discharge: 2024-01-16 | Disposition: A | Discharge status: Home / Self Care | Source: Ambulatory Visit | Attending: Orthopedic Surgery | Admitting: Orthopedic Surgery

## 2024-01-16 ENCOUNTER — Encounter (HOSPITAL_COMMUNITY): Payer: Self-pay | Admitting: Orthopedic Surgery

## 2024-01-16 ENCOUNTER — Ambulatory Visit (HOSPITAL_COMMUNITY): Admitting: Anesthesiology

## 2024-01-16 ENCOUNTER — Other Ambulatory Visit: Payer: Self-pay

## 2024-01-16 ENCOUNTER — Encounter (HOSPITAL_COMMUNITY)
Admission: RE | Disposition: A | Discharge status: Home / Self Care | Payer: Self-pay | Source: Ambulatory Visit | Attending: Orthopedic Surgery

## 2024-01-16 DIAGNOSIS — Z79899 Other long term (current) drug therapy: Secondary | ICD-10-CM | POA: Diagnosis not present

## 2024-01-16 DIAGNOSIS — Z01818 Encounter for other preprocedural examination: Secondary | ICD-10-CM

## 2024-01-16 DIAGNOSIS — M1712 Unilateral primary osteoarthritis, left knee: Secondary | ICD-10-CM

## 2024-01-16 DIAGNOSIS — Z96652 Presence of left artificial knee joint: Secondary | ICD-10-CM

## 2024-01-16 DIAGNOSIS — K219 Gastro-esophageal reflux disease without esophagitis: Secondary | ICD-10-CM | POA: Insufficient documentation

## 2024-01-16 DIAGNOSIS — K449 Diaphragmatic hernia without obstruction or gangrene: Secondary | ICD-10-CM | POA: Diagnosis not present

## 2024-01-16 DIAGNOSIS — I10 Essential (primary) hypertension: Secondary | ICD-10-CM | POA: Diagnosis not present

## 2024-01-16 DIAGNOSIS — G8918 Other acute postprocedural pain: Secondary | ICD-10-CM | POA: Diagnosis not present

## 2024-01-16 HISTORY — PX: TOTAL KNEE ARTHROPLASTY: SHX125

## 2024-01-16 SURGERY — ARTHROPLASTY, KNEE, TOTAL
Anesthesia: Spinal | Site: Knee | Laterality: Left

## 2024-01-16 MED ORDER — BUPIVACAINE-EPINEPHRINE (PF) 0.25% -1:200000 IJ SOLN
INTRAMUSCULAR | Status: AC
Start: 2024-01-16 — End: ?
  Filled 2024-01-16: qty 30

## 2024-01-16 MED ORDER — BUPIVACAINE-EPINEPHRINE (PF) 0.25% -1:200000 IJ SOLN
INTRAMUSCULAR | Status: DC | PRN
  Administered 2024-01-16: 30 mL

## 2024-01-16 MED ORDER — DEXAMETHASONE SODIUM PHOSPHATE 10 MG/ML IJ SOLN
8.0000 mg | Freq: Once | INTRAMUSCULAR | Status: AC
  Administered 2024-01-16: 8 mg via INTRAVENOUS

## 2024-01-16 MED ORDER — METHOCARBAMOL 500 MG PO TABS
500.0000 mg | ORAL_TABLET | Freq: Four times a day (QID) | ORAL | Status: DC | PRN
  Administered 2024-01-16: 500 mg via ORAL

## 2024-01-16 MED ORDER — PROPOFOL 500 MG/50ML IV EMUL
INTRAVENOUS | Status: DC | PRN
  Administered 2024-01-16: 75 ug/kg/min via INTRAVENOUS

## 2024-01-16 MED ORDER — OXYCODONE HCL 5 MG PO TABS
ORAL_TABLET | ORAL | Status: AC
  Filled 2024-01-16: qty 1

## 2024-01-16 MED ORDER — LIDOCAINE HCL (PF) 2 % IJ SOLN
INTRAMUSCULAR | Status: AC
  Filled 2024-01-16: qty 10

## 2024-01-16 MED ORDER — CHLORHEXIDINE GLUCONATE 0.12 % MT SOLN
15.0000 mL | Freq: Once | OROMUCOSAL | Status: AC
  Administered 2024-01-16: 15 mL via OROMUCOSAL

## 2024-01-16 MED ORDER — DEXAMETHASONE SODIUM PHOSPHATE 10 MG/ML IJ SOLN
INTRAMUSCULAR | Status: AC
  Filled 2024-01-16: qty 2

## 2024-01-16 MED ORDER — CEFAZOLIN SODIUM-DEXTROSE 2-4 GM/100ML-% IV SOLN
2.0000 g | Freq: Four times a day (QID) | INTRAVENOUS | Status: DC
  Administered 2024-01-16: 2 g via INTRAVENOUS

## 2024-01-16 MED ORDER — METHOCARBAMOL 1000 MG/10ML IJ SOLN: 500.0000 mg | Freq: Four times a day (QID) | INTRAMUSCULAR | Status: DC | PRN

## 2024-01-16 MED ORDER — MUPIROCIN 2 % EX OINT: 1.0000 | TOPICAL_OINTMENT | Freq: Two times a day (BID) | CUTANEOUS | 0 refills | Status: AC

## 2024-01-16 MED ORDER — OXYCODONE HCL 5 MG/5ML PO SOLN: 5.0000 mg | Freq: Once | ORAL | Status: AC | PRN

## 2024-01-16 MED ORDER — CHLORHEXIDINE GLUCONATE 4 % EX SOLN: 1.0000 | CUTANEOUS | 1 refills | Status: DC

## 2024-01-16 MED ORDER — SODIUM CHLORIDE (PF) 0.9 % IJ SOLN
INTRAMUSCULAR | Status: DC | PRN
  Administered 2024-01-16: 30 mL via INTRAVENOUS

## 2024-01-16 MED ORDER — CELECOXIB 200 MG PO CAPS: 200.0000 mg | ORAL_CAPSULE | Freq: Two times a day (BID) | ORAL | 0 refills | Status: AC

## 2024-01-16 MED ORDER — LACTATED RINGERS IV SOLN: INTRAVENOUS | Status: DC | PRN

## 2024-01-16 MED ORDER — METHOCARBAMOL 500 MG PO TABS
ORAL_TABLET | ORAL | Status: AC
  Filled 2024-01-16: qty 1

## 2024-01-16 MED ORDER — ONDANSETRON HCL 4 MG/2ML IJ SOLN
INTRAMUSCULAR | Status: DC | PRN
  Administered 2024-01-16: 4 mg via INTRAVENOUS

## 2024-01-16 MED ORDER — POVIDONE-IODINE 10 % EX SWAB
2.0000 | Freq: Once | CUTANEOUS | Status: AC
  Administered 2024-01-16: 2 via TOPICAL

## 2024-01-16 MED ORDER — ONDANSETRON HCL 4 MG/2ML IJ SOLN
INTRAMUSCULAR | Status: AC
  Filled 2024-01-16: qty 4

## 2024-01-16 MED ORDER — LACTATED RINGERS IV SOLN: INTRAVENOUS | Status: DC

## 2024-01-16 MED ORDER — MIDAZOLAM HCL 2 MG/2ML IJ SOLN: 0.5000 mg | Freq: Once | INTRAMUSCULAR | Status: DC | PRN

## 2024-01-16 MED ORDER — SODIUM CHLORIDE 0.9 % IR SOLN
Status: DC | PRN
  Administered 2024-01-16: 1000 mL

## 2024-01-16 MED ORDER — KETOROLAC TROMETHAMINE 30 MG/ML IJ SOLN
INTRAMUSCULAR | Status: AC
  Filled 2024-01-16: qty 1

## 2024-01-16 MED ORDER — ACETAMINOPHEN 500 MG PO TABS: 1000.0000 mg | ORAL_TABLET | Freq: Once | ORAL | Status: DC

## 2024-01-16 MED ORDER — KETOROLAC TROMETHAMINE 30 MG/ML IJ SOLN
INTRAMUSCULAR | Status: DC | PRN
  Administered 2024-01-16: 30 mg via INTRA_ARTICULAR

## 2024-01-16 MED ORDER — OXYCODONE HCL 5 MG PO TABS
5.0000 mg | ORAL_TABLET | Freq: Once | ORAL | Status: AC | PRN
  Administered 2024-01-16: 5 mg via ORAL

## 2024-01-16 MED ORDER — SENNA 8.6 MG PO TABS: 1.0000 | ORAL_TABLET | Freq: Every day | ORAL | 0 refills | Status: AC

## 2024-01-16 MED ORDER — TRANEXAMIC ACID-NACL 1000-0.7 MG/100ML-% IV SOLN
INTRAVENOUS | Status: AC
  Filled 2024-01-16: qty 100

## 2024-01-16 MED ORDER — CEFAZOLIN SODIUM-DEXTROSE 2-4 GM/100ML-% IV SOLN
INTRAVENOUS | Status: AC
  Filled 2024-01-16: qty 100

## 2024-01-16 MED ORDER — HYDROMORPHONE HCL 1 MG/ML IJ SOLN: 0.2500 mg | INTRAMUSCULAR | Status: DC | PRN

## 2024-01-16 MED ORDER — EPHEDRINE SULFATE (PRESSORS) 50 MG/ML IJ SOLN
INTRAMUSCULAR | Status: DC | PRN
  Administered 2024-01-16 (×2): 10 mg via INTRAVENOUS
  Administered 2024-01-16: 5 mg via INTRAVENOUS

## 2024-01-16 MED ORDER — 0.9 % SODIUM CHLORIDE (POUR BTL) OPTIME
TOPICAL | Status: DC | PRN
  Administered 2024-01-16: 1000 mL

## 2024-01-16 MED ORDER — PHENYLEPHRINE HCL-NACL 20-0.9 MG/250ML-% IV SOLN
INTRAVENOUS | Status: DC
Start: 2024-01-16 — End: 2024-01-16
  Filled 2024-01-16: qty 250

## 2024-01-16 MED ORDER — POLYETHYLENE GLYCOL 3350 17 G PO PACK: 17.0000 g | PACK | Freq: Two times a day (BID) | ORAL | Status: DC

## 2024-01-16 MED ORDER — PROPOFOL 1000 MG/100ML IV EMUL
INTRAVENOUS | Status: AC
  Filled 2024-01-16: qty 300

## 2024-01-16 MED ORDER — FENTANYL CITRATE (PF) 100 MCG/2ML IJ SOLN
INTRAMUSCULAR | Status: AC
Start: 2024-01-16 — End: ?
  Filled 2024-01-16: qty 2

## 2024-01-16 MED ORDER — ASPIRIN 81 MG PO CHEW
81.0000 mg | CHEWABLE_TABLET | Freq: Two times a day (BID) | ORAL | 0 refills | Status: AC
Start: 2024-01-16 — End: 2024-02-13

## 2024-01-16 MED ORDER — FENTANYL CITRATE (PF) 100 MCG/2ML IJ SOLN
INTRAMUSCULAR | Status: DC | PRN
  Administered 2024-01-16: 50 ug via INTRAVENOUS

## 2024-01-16 MED ORDER — METHOCARBAMOL 500 MG PO TABS: 500.0000 mg | ORAL_TABLET | Freq: Four times a day (QID) | ORAL | 1 refills | Status: AC | PRN

## 2024-01-16 MED ORDER — CEFAZOLIN SODIUM-DEXTROSE 2-4 GM/100ML-% IV SOLN
2.0000 g | INTRAVENOUS | Status: AC
  Administered 2024-01-16: 2 g via INTRAVENOUS
  Filled 2024-01-16: qty 100

## 2024-01-16 MED ORDER — ROPIVACAINE HCL 7.5 MG/ML IJ SOLN
INTRAMUSCULAR | Status: DC | PRN
  Administered 2024-01-16: 20 mL via PERINEURAL

## 2024-01-16 MED ORDER — PHENYLEPHRINE HCL-NACL 20-0.9 MG/250ML-% IV SOLN
INTRAVENOUS | Status: AC
  Filled 2024-01-16: qty 250

## 2024-01-16 MED ORDER — TRANEXAMIC ACID-NACL 1000-0.7 MG/100ML-% IV SOLN
1000.0000 mg | Freq: Once | INTRAVENOUS | Status: AC
  Administered 2024-01-16: 1000 mg via INTRAVENOUS

## 2024-01-16 MED ORDER — MEPERIDINE HCL 50 MG/ML IJ SOLN: 6.2500 mg | INTRAMUSCULAR | Status: DC | PRN

## 2024-01-16 MED ORDER — ORAL CARE MOUTH RINSE: 15.0000 mL | Freq: Once | OROMUCOSAL | Status: AC

## 2024-01-16 MED ORDER — TRANEXAMIC ACID-NACL 1000-0.7 MG/100ML-% IV SOLN
1000.0000 mg | INTRAVENOUS | Status: AC
  Administered 2024-01-16: 1000 mg via INTRAVENOUS
  Filled 2024-01-16: qty 100

## 2024-01-16 MED ORDER — TRAMADOL HCL 50 MG PO TABS: 50.0000 mg | ORAL_TABLET | Freq: Four times a day (QID) | ORAL | 0 refills | Status: DC | PRN

## 2024-01-16 MED ORDER — SODIUM CHLORIDE (PF) 0.9 % IJ SOLN
INTRAMUSCULAR | Status: AC
Start: 2024-01-16 — End: ?
  Filled 2024-01-16: qty 30

## 2024-01-16 MED ORDER — PHENYLEPHRINE HCL-NACL 20-0.9 MG/250ML-% IV SOLN
INTRAVENOUS | Status: DC | PRN
  Administered 2024-01-16: 35 ug/min via INTRAVENOUS

## 2024-01-16 MED ORDER — BUPIVACAINE IN DEXTROSE 0.75-8.25 % IT SOLN
INTRATHECAL | Status: DC | PRN
  Administered 2024-01-16: 12 mg via INTRATHECAL

## 2024-01-16 MED ORDER — STERILE WATER FOR IRRIGATION IR SOLN
Status: DC | PRN
  Administered 2024-01-16: 2000 mL

## 2024-01-16 SURGICAL SUPPLY — 45 items
ATTUNE MED ANAT PAT 38 KNEE (Knees) IMPLANT
BAG COUNTER SPONGE SURGICOUNT (BAG) IMPLANT
BAG ZIPLOCK 12X15 (MISCELLANEOUS) ×1 IMPLANT
BASEPLATE TIB CMT FB PCKT SZ5 (Knees) IMPLANT
BLADE SAW SGTL 13.0X1.19X90.0M (BLADE) ×1 IMPLANT
BNDG ELASTIC 6INX 5YD STR LF (GAUZE/BANDAGES/DRESSINGS) ×1 IMPLANT
BNDG ELASTIC 6X10 VLCR STRL LF (GAUZE/BANDAGES/DRESSINGS) IMPLANT
BOWL SMART MIX CTS (DISPOSABLE) ×1 IMPLANT
CEMENT HV SMART SET (Cement) ×2 IMPLANT
COMPONENT FEM CMT ATTN NRW 5LT (Joint) IMPLANT
COVER SURGICAL LIGHT HANDLE (MISCELLANEOUS) ×1 IMPLANT
CUFF TRNQT CYL 34X4.125X (TOURNIQUET CUFF) ×1 IMPLANT
DERMABOND ADVANCED .7 DNX12 (GAUZE/BANDAGES/DRESSINGS) ×1 IMPLANT
DRAPE U-SHAPE 47X51 STRL (DRAPES) ×1 IMPLANT
DRESSING AQUACEL AG SP 3.5X10 (GAUZE/BANDAGES/DRESSINGS) ×1 IMPLANT
DURAPREP 26ML APPLICATOR (WOUND CARE) ×2 IMPLANT
ELECT REM PT RETURN 15FT ADLT (MISCELLANEOUS) ×1 IMPLANT
GLOVE BIO SURGEON STRL SZ 6 (GLOVE) ×1 IMPLANT
GLOVE BIOGEL PI IND STRL 6.5 (GLOVE) ×1 IMPLANT
GLOVE BIOGEL PI IND STRL 7.5 (GLOVE) ×1 IMPLANT
GLOVE ORTHO TXT STRL SZ7.5 (GLOVE) ×2 IMPLANT
GOWN STRL REUS W/ TWL LRG LVL3 (GOWN DISPOSABLE) ×2 IMPLANT
HOLDER FOLEY CATH W/STRAP (MISCELLANEOUS) IMPLANT
KIT TURNOVER KIT A (KITS) ×1 IMPLANT
LINER TIB ATTUNE FIX 5 8 LT (Liner) IMPLANT
MANIFOLD NEPTUNE II (INSTRUMENTS) ×1 IMPLANT
NDL SAFETY ECLIPSE 18X1.5 (NEEDLE) IMPLANT
NS IRRIG 1000ML POUR BTL (IV SOLUTION) ×1 IMPLANT
PACK TOTAL KNEE CUSTOM (KITS) ×1 IMPLANT
PENCIL SMOKE EVACUATOR (MISCELLANEOUS) ×1 IMPLANT
PIN FIX SIGMA LCS THRD HI (PIN) IMPLANT
PROTECTOR NERVE ULNAR (MISCELLANEOUS) ×1 IMPLANT
SET HNDPC FAN SPRY TIP SCT (DISPOSABLE) ×1 IMPLANT
SET PAD KNEE POSITIONER (MISCELLANEOUS) ×1 IMPLANT
SPIKE FLUID TRANSFER (MISCELLANEOUS) ×2 IMPLANT
SUT MNCRL AB 4-0 PS2 18 (SUTURE) ×1 IMPLANT
SUT STRATAFIX PDS+ 0 24IN (SUTURE) ×1 IMPLANT
SUT VIC AB 1 CT1 36 (SUTURE) ×1 IMPLANT
SUT VIC AB 2-0 CT1 TAPERPNT 27 (SUTURE) ×2 IMPLANT
SYR 3ML LL SCALE MARK (SYRINGE) ×1 IMPLANT
TOWEL GREEN STERILE FF (TOWEL DISPOSABLE) ×1 IMPLANT
TRAY FOLEY MTR SLVR 16FR STAT (SET/KITS/TRAYS/PACK) ×1 IMPLANT
TUBE SUCTION HIGH CAP CLEAR NV (SUCTIONS) ×1 IMPLANT
WATER STERILE IRR 1000ML POUR (IV SOLUTION) ×2 IMPLANT
WRAP KNEE MAXI GEL POST OP (GAUZE/BANDAGES/DRESSINGS) ×1 IMPLANT

## 2024-01-16 NOTE — Interval H&P Note (Signed)
 History and Physical Interval Note:  01/16/2024 8:11 AM  [name]  has presented today for surgery, with the diagnosis of Left knee osteoarthritis.  The various methods of treatment have been discussed with the patient and family. After consideration of risks, benefits and other options for treatment, the patient has consented to  Procedure(s): ARTHROPLASTY, KNEE, TOTAL (Left) as a surgical intervention.  The patient's history has been reviewed, patient examined, no change in status, stable for surgery.  I have reviewed the patient's chart and labs.  Questions were answered to the patient's satisfaction.     Ralph Stevens

## 2024-01-16 NOTE — Anesthesia Postprocedure Evaluation (Signed)
 Anesthesia Post Note  Patient: Ralph Stevens  Procedure(s) Performed: ARTHROPLASTY, KNEE, TOTAL (Left: Knee)     Patient location during evaluation: Phase II Anesthesia Type: Spinal Level of consciousness: awake and alert, patient cooperative and oriented Pain management: pain level controlled Vital Signs Assessment: post-procedure vital signs reviewed and stable Respiratory status: nonlabored ventilation, spontaneous breathing and respiratory function stable Cardiovascular status: stable and blood pressure returned to baseline Postop Assessment: no apparent nausea or vomiting, patient able to bend at knees, spinal receding and adequate PO intake Anesthetic complications: no   No notable events documented.  Last Vitals:  Vitals:   01/16/24 1016 01/16/24 1301  BP: 127/76 136/79  Pulse: 77 80  Resp: 15   Temp:    SpO2: 95% 97%    Last Pain:  Vitals:   01/16/24 1301  TempSrc:   PainSc: 5                  Addie Alonge,E. Kamree Wiens

## 2024-01-16 NOTE — Evaluation (Signed)
 Physical Therapy Evaluation Patient Details Name: Ralph Stevens MRN: [id number] DOB: Oct 28, 1950 Today's Date: 01/16/2024  History of Present Illness  73 yo male presents to therapy s/p L TKA on 01/16/2024 due to failure of conservative measures. Pt PMH includes but is no limited to hemorrhoids, hernia, GERD, HLD, and HTN.  Clinical Impression    Ralph Stevens is a 73 y.o. male POD 0 s/p L TKA. Patient reports IND with mobility at baseline. Patient is now limited by functional impairments (see PT problem list below) and requires CGA and cues for transfers and gait with RW. Patient was able to ambulate 50 feet with RW and CGA and 15 feet RW with S and cues for safe walker management. Patient educated on safe sequencing for stair mobility with one handrail and cane, fall risk prevention, pain management, use of CP/ice, car transfers pt and spouse verbalized understanding of safe guarding position for people assisting with mobility. Patient instructed in exercises to facilitate ROM and circulation reviewed and HO provided. Patient will benefit from continued skilled PT interventions to address impairments and progress towards PLOF. Patient has met mobility goals at adequate level for discharge home with family support and OPPT services scheduled for 5/30; will continue to follow if pt continues acute stay to progress towards Mod I goals.       If plan is discharge home, recommend the following: A little help with walking and/or transfers;A little help with bathing/dressing/bathroom;Assistance with cooking/housework;Assist for transportation;Help with stairs or ramp for entrance   Can travel by private vehicle        Equipment Recommendations Rolling walker (2 wheels)  Recommendations for Other Services       Functional Status Assessment Patient has had a recent decline in their functional status and demonstrates the ability to make significant improvements in function in a reasonable  and predictable amount of time.     Precautions / Restrictions Precautions Precautions: Knee;Fall Restrictions Weight Bearing Restrictions Per Provider Order: No      Mobility  Bed Mobility Overal bed mobility: Needs Assistance Bed Mobility: Supine to Sit     Supine to sit: Supervision, HOB elevated     General bed mobility comments: min cues    Transfers Overall transfer level: Needs assistance Equipment used: Rolling walker (2 wheels) Transfers: Sit to/from Stand Sit to Stand: Contact guard assist           General transfer comment: min cues for proper UE and AD placement    Ambulation/Gait Ambulation/Gait assistance: Contact guard assist, Supervision Gait Distance (Feet): 50 Feet Assistive device: Rolling walker (2 wheels) Gait Pattern/deviations: Step-to pattern, Decreased stance time - left, Antalgic, Trunk flexed Gait velocity: decreased     General Gait Details: slight trunk flexion with B UE support at RW, to offload L LE in stance phase, min cues for posture and proper distance from RW with pt able to progress to 15 feet with close S  Stairs Stairs: Yes Stairs assistance: Contact guard assist Stair Management: Two rails, With cane, One rail Right Number of Stairs: 3 General stair comments: PT assessed safety with step navigation with B handrails then with one handrail with mild instabiltiy noted and able to provide instruction for R handrail and use of cane in L UE with pt able to self cue for proper technique  Wheelchair Mobility     Tilt Bed    Modified Rankin (Stroke Patients Only)       Balance Overall balance assessment: Needs assistance,  History of Falls Sitting-balance support: Feet supported Sitting balance-Leahy Scale: Good     Standing balance support: Bilateral upper extremity supported, During functional activity, Reliant on assistive device for balance Standing balance-Leahy Scale: Fair Standing balance comment: static  standing no UE support                             Pertinent Vitals/Pain Pain Assessment Pain Assessment: 0-10 Pain Score: 3  Pain Location: L knee and LE Pain Descriptors / Indicators: Aching, Constant, Discomfort, Dull, Grimacing, Operative site guarding Pain Intervention(s): Limited activity within patient's tolerance, Monitored during session, Premedicated before session, Repositioned, Ice applied    Home Living Family/patient expects to be discharged to:: Private residence Living Arrangements: Spouse/significant other;Children Available Help at Discharge: Family Type of Home: House Home Access: Stairs to enter Entrance Stairs-Rails: Right Entrance Stairs-Number of Steps: 3   Home Layout: One level Home Equipment: Grab bars - tub/shower;Cane - single point;Cane - quad;BSC/3in1      Prior Function Prior Level of Function : Independent/Modified Independent;Driving;History of Falls (last six months)             Mobility Comments: IND no AD with all ADLs, self care tasks, IADLs       Extremity/Trunk Assessment        Lower Extremity Assessment Lower Extremity Assessment: LLE deficits/detail LLE Deficits / Details: ankle DF/PF 5/5; SRL , 10 degree lag LLE Sensation: WNL    Cervical / Trunk Assessment Cervical / Trunk Assessment: Normal  Communication   Communication Communication: Impaired Factors Affecting Communication: Hearing impaired (B hearingaids)    Cognition Arousal: Alert Behavior During Therapy: WFL for tasks assessed/performed   PT - Cognitive impairments: No apparent impairments                         Following commands: Intact       Cueing       General Comments      Exercises Total Joint Exercises Ankle Circles/Pumps: AROM, Both, 10 reps Quad Sets: AROM, Left, 5 reps Short Arc Quad: AROM, Left, 5 reps Heel Slides: AROM, Left, 5 reps Hip ABduction/ADduction: AROM, Left, 5 reps Straight Leg Raises: AROM,  Left, 5 reps Knee Flexion: AROM, Left, 5 reps   Assessment/Plan    PT Assessment Patient needs continued PT services  PT Problem List Decreased strength;Decreased range of motion;Decreased activity tolerance;Decreased balance;Decreased mobility;Other (comment)       PT Treatment Interventions DME instruction;Gait training;Stair training;Functional mobility training;Therapeutic activities;Therapeutic exercise;Balance training;Neuromuscular re-education;Patient/family education;Modalities    PT Goals (Current goals can be found in the Care Plan section)  Acute Rehab PT Goals Patient Stated Goal: to be able to do everything I was doing before and be able to walk the dog and get comfortable when sitting PT Goal Formulation: With patient Time For Goal Achievement: 01/30/24 Potential to Achieve Goals: Good    Frequency 7X/week     Co-evaluation               AM-PAC PT "6 Clicks" Mobility  Outcome Measure Help needed turning from your back to your side while in a flat bed without using bedrails?: None Help needed moving from lying on your back to sitting on the side of a flat bed without using bedrails?: A Little Help needed moving to and from a bed to a chair (including a wheelchair)?: A Little Help needed standing up from a chair  using your arms (e.g., wheelchair or bedside chair)?: A Little Help needed to walk in hospital room?: A Little Help needed climbing 3-5 steps with a railing? : A Little 6 Click Score: 19    End of Session Equipment Utilized During Treatment: Gait belt Activity Tolerance: No increased pain;Patient tolerated treatment well Patient left: in chair;with call bell/phone within reach;with family/visitor present Nurse Communication: Mobility status;Other (comment) (pt readiness for d/c from PT standpoint) PT Visit Diagnosis: Unsteadiness on feet (R26.81);Other abnormalities of gait and mobility (R26.89);Muscle weakness (generalized) (M62.81);History of  falling (Z91.81);Difficulty in walking, not elsewhere classified (R26.2);Pain Pain - Right/Left: Left Pain - part of body: Knee;Leg    Time: 1151-1230 PT Time Calculation (min) (ACUTE ONLY): 39 min   Charges:   PT Evaluation $PT Eval Low Complexity: 1 Low PT Treatments $Gait Training: 8-22 mins $Therapeutic Exercise: 8-22 mins PT General Charges $$ ACUTE PT VISIT: 1 Visit         Cary Clarks, PT Acute Rehab   Annalee Kiang 01/16/2024, 12:56 PM

## 2024-01-16 NOTE — Anesthesia Procedure Notes (Signed)
 Anesthesia Regional Block: Adductor canal block   Pre-Anesthetic Checklist: , timeout performed,  Correct Patient, Correct Site, Correct Laterality,  Correct Procedure, Correct Position, site marked,  Risks and benefits discussed,  Surgical consent,  Pre-op evaluation,  At surgeon's request and post-op pain management  Laterality: Left and Lower  Prep: chloraprep       Needles:  Injection technique: Single-shot  Needle Type: Echogenic Needle     Needle Length: 9cm  Needle Gauge: 21     Additional Needles:   Procedures:,,,, ultrasound used (permanent image in chart),,    Narrative:  Start time: 01/16/2024 6:46 AM End time: 01/16/2024 6:52 AM Injection made incrementally with aspirations every 5 mL.  Performed by: Personally  Anesthesiologist: Jonne Netters, MD  Additional Notes: Pt identified in Holding room.  Monitors applied. Working IV access confirmed. Timeout, Sterile prep L thigh.  #21ga ECHOgenic Arrow block needle into adductor canal with US  guidance.  20cc 0.75% Ropivacaine injected incrementally after negative test dose.  Patient asymptomatic, VSS, no heme aspirated, tolerated well.   Fay Hoop, MD

## 2024-01-16 NOTE — Anesthesia Procedure Notes (Signed)
 Spinal  Patient location during procedure: OR End time: 01/16/2024 7:19 AM Reason for block: surgical anesthesia Staffing Performed: anesthesiologist  Anesthesiologist: Jonne Netters, MD Performed by: Jonne Netters, MD Authorized by: Jonne Netters, MD   Preanesthetic Checklist Completed: patient identified, IV checked, site marked, risks and benefits discussed, surgical consent, monitors and equipment checked, pre-op evaluation and timeout performed Spinal Block Patient position: sitting Prep: DuraPrep Patient monitoring: heart rate, cardiac monitor, continuous pulse ox and blood pressure Approach: midline Location: L3-4 Injection technique: single-shot Needle Needle type: Pencan and Introducer  Needle gauge: 24 G Needle length: 9 cm Assessment Sensory level: T4 Events: CSF return Additional Notes Pt identified in Operating room.  Monitors applied. Working IV access confirmed. Sterile prep, drape lumbar spine.  1% lido local L 3,4.  #24ga Pencan into clear CSF L 3,4.  12mg  0.75% Bupivacaine  with dextrose injected with asp CSF beginning and end of injection.  Patient asymptomatic, VSS, no heme aspirated, tolerated well.  Fay Hoop, MD

## 2024-01-16 NOTE — Discharge Instructions (Signed)

## 2024-01-16 NOTE — Op Note (Signed)
 NAME:  Ralph Stevens                      MEDICAL RECORD NO.:  [id number]                             FACILITY:  [facility]      PHYSICIAN:  Azalea Lento. Bernard Brick, M.D.  DATE OF BIRTH:  Aug 07, 1951      DATE OF PROCEDURE:  01/16/2024                                     OPERATIVE REPORT         PREOPERATIVE DIAGNOSIS:  Left knee osteoarthritis.      POSTOPERATIVE DIAGNOSIS:  Left knee osteoarthritis.      FINDINGS:  The patient was noted to have complete loss of cartilage and   bone-on-bone arthritis with associated osteophytes in the patellofemoral and medial compartments of   the knee with a significant synovitis and associated effusion.  The patient had failed months of conservative treatment including medications, injection therapy, activity modification.     PROCEDURE:  Left total knee replacement.      COMPONENTS USED:  DePuy Attune FB CR MS knee   system, a size 5N femur, 5 tibia, size 8 mm PS AOX insert, and 38 anatomic patellar   button.      SURGEON:  Azalea Lento. Bernard Brick, M.D.      ASSISTANT:  Kim Pen, PA-C.      ANESTHESIA:  Regional and Spinal.      SPECIMENS:  None.      COMPLICATION:  None.      DRAINS:  None.  EBL: <100 cc      TOURNIQUET TIME:   Total Tourniquet Time Documented: Thigh (Left) - 26 minutes Total: Thigh (Left) - 26 minutes  .      The patient was stable to the recovery room.      INDICATION FOR PROCEDURE:  Ralph Stevens is a 73 y.o. male patient of   mine.  The patient had been seen, evaluated, and treated for months conservatively in the   office with medication, activity modification, and injections.  The patient had   radiographic changes of bone-on-bone arthritis with endplate sclerosis and osteophytes noted.  Based on the radiographic changes and failed conservative measures, the patient   decided to proceed with definitive treatment, total knee replacement.  Risks of infection, DVT, component failure, need for revision  surgery, neurovascular injury were reviewed in the office setting.  The postop course was reviewed stressing the efforts to maximize post-operative satisfaction and function.  Consent was obtained for benefit of pain   relief.      PROCEDURE IN DETAIL:  The patient was brought to the operative theater.   Once adequate anesthesia, preoperative antibiotics, 2 gm of Ancef,1 gm of Tranexamic Acid, and 10 mg of Decadron  administered, the patient was positioned supine with a left thigh tourniquet placed.  The  left lower extremity was prepped and draped in sterile fashion.  A time-   out was performed identifying the patient, planned procedure, and the appropriate extremity.      The left lower extremity was placed in the [facility] leg holder.  The leg was   exsanguinated, tourniquet elevated to 225 mmHg.  A midline incision was   made  followed by median parapatellar arthrotomy.  Following initial   exposure, attention was first directed to the patella.  Precut   measurement was noted to be 22 mm.  I resected down to 14 mm and used a   38 anatomic patellar button to restore patellar height as well as cover the cut surface.      The lug holes were drilled and a metal shim was placed to protect the   patella from retractors and saw blade during the procedure.      At this point, attention was now directed to the femur.  The femoral   canal was opened with a drill, irrigated to try to prevent fat emboli.  An   intramedullary rod was passed at 5 degrees valgus, 9 mm of bone was   resected off the distal femur.  Following this resection, the tibia was   subluxated anteriorly.  Using the extramedullary guide, 3 mm of bone was resected off   the proximal medial tibia.  We confirmed the gap would be   stable medially and laterally with a size 6 spacer block as well as confirmed that the tibial cut was perpendicular in the coronal plane, checking with an alignment rod.      Once this was done, I sized the  femur to be a size 5 in the anterior-   posterior dimension, chose a narrow component based on medial and   lateral dimension.  The size 5 rotation block was then pinned in   position anterior referenced using the C-clamp to set rotation.  The   anterior, posterior, and  chamfer cuts were made without difficulty nor   notching making certain that I was along the anterior cortex to help   with flexion gap stability.      The final box cut was made off the lateral aspect of distal femur.      At this point, the tibia was sized to be a size 5.  The size 5 tray was   then pinned in position through the medial third of the tubercle,   drilled, and keel punched.  Trial reduction was now carried with a 5 femur,  5 tibia, a size 8 mm CR MS insert, and the 38 anatomic patella botton.  The knee was brought to full extension with good flexion stability with the patella   tracking through the trochlea without application of pressure.  Given   all these findings the trial components removed.  Final components were   opened and cement was mixed.  The knee was irrigated with normal saline solution and pulse lavage.  The synovial lining was   then injected with 30 cc of 0.25% Marcaine  with epinephrine , 1 cc of Toradol  and 30 cc of NS for a total of 61 cc.     Final implants were then cemented onto cleaned and dried cut surfaces of bone with the knee brought to extension with a size 8 mm CR MS trial insert.      Once the cement had fully cured, excess cement was removed   throughout the knee.  I confirmed that I was satisfied with the range of   motion and stability, and the final size 8 mm CR MS AOX insert was chosen.  It was   placed into the knee.      The tourniquet had been let down at 26 minutes.  No significant   hemostasis was required.  The extensor mechanism was then reapproximated using #  1 Vicryl and #1 Stratafix sutures with the knee   in flexion.  The   remaining wound was closed with 2-0  Vicryl and running 4-0 Monocryl.   The knee was cleaned, dried, dressed sterilely using Dermabond and   Aquacel dressing.  The patient was then   brought to recovery room in stable condition, tolerating the procedure   well.   Please note that Physician Assistant, Kim Pen, PA-C was present for the entirety of the case, and was utilized for pre-operative positioning, peri-operative retractor management, general facilitation of the procedure and for primary wound closure at the end of the case.              Azalea Lento Bernard Brick, M.D.    01/16/2024 8:12 AM

## 2024-01-16 NOTE — Transfer of Care (Signed)
 Immediate Anesthesia Transfer of Care Note  Patient: Ralph Stevens  Procedure(s) Performed: ARTHROPLASTY, KNEE, TOTAL (Left: Knee)  Patient Location: PACU  Anesthesia Type:MAC combined with regional for post-op pain  Level of Consciousness: awake and alert   Airway & Oxygen Therapy: Patient Spontanous Breathing and Patient connected to nasal cannula oxygen  Post-op Assessment: Report given to RN and Post -op Vital signs reviewed and stable  Post vital signs: Reviewed and stable  Last Vitals:  Vitals Value Taken Time  BP 113/62 01/16/24 0850  Temp    Pulse 78 01/16/24 0851  Resp 10 01/16/24 0851  SpO2 95 % 01/16/24 0851  Vitals shown include unfiled device data.  Last Pain:  Vitals:   01/16/24 0557  TempSrc:   PainSc: 0-No pain         Complications: No notable events documented.

## 2024-01-16 NOTE — Care Plan (Signed)
 Ortho Bundle Case Management Note  Patient Details  Name: Ralph Stevens MRN: [id number] Date of Birth: 1951-04-11  LT TKA on 01/16/24  DCP: Home with Wife/Son DME: RW, ordered through [employer] PT: [facility]                   DME Arranged:  Walker rolling DME Agency:  [employer]  HH Arranged:    HH Agency:     Additional Comments: Please contact me with any questions of if this plan should need to change.  Kathlene Paradise, Case Manager [facility] [phone] Ext. [phone]   01/16/2024, 8:17 AM

## 2024-01-18 ENCOUNTER — Encounter (HOSPITAL_COMMUNITY): Payer: Self-pay | Admitting: Orthopedic Surgery

## 2024-01-19 ENCOUNTER — Ambulatory Visit: Attending: Orthopedic Surgery

## 2024-01-19 DIAGNOSIS — M25662 Stiffness of left knee, not elsewhere classified: Secondary | ICD-10-CM | POA: Diagnosis not present

## 2024-01-19 DIAGNOSIS — R600 Localized edema: Secondary | ICD-10-CM | POA: Diagnosis not present

## 2024-01-19 DIAGNOSIS — M25562 Pain in left knee: Secondary | ICD-10-CM | POA: Insufficient documentation

## 2024-01-19 NOTE — Therapy (Signed)
 OUTPATIENT PHYSICAL THERAPY LOWER EXTREMITY EVALUATION   Patient Name: Ralph Stevens MRN: [id number] DOB:03-23-1951, 73 y.o., male Today's Date: 01/19/2024  END OF SESSION:  PT End of Session - 01/19/24 1017     Visit Number 1    Number of Visits 12    Date for PT Re-Evaluation 02/16/24    PT Start Time 1019    PT Stop Time 1057    PT Time Calculation (min) 38 min    Activity Tolerance Patient tolerated treatment well    Behavior During Therapy WFL for tasks assessed/performed             Past Medical History:  Diagnosis Date   Acid reflux    Arthritis    Arthritis    Cataract    Mixed form OU   Enlarged prostate    PSA goes up and down    Family history of adverse reaction to anesthesia    sister has had trouble waking up   GERD (gastroesophageal reflux disease)    High cholesterol    Hyperlipidemia    Hypertension    Hypertensive retinopathy    OU   Pneumonia    Sepsis [facility])    Past Surgical History:  Procedure Laterality Date   colonoscopy  2019   internal hemorrhoids and pancolonic diverticulosis. Surveillance due in 2024 due to history of adenomatous colon polyps and family history.   COLONOSCOPY N/A 06/23/2021   Hemorrhoids on perianal exam. 2 small 4 hemorrhoid tags, minimal internla hemorrhoids, scars appropriately located. Scattered medium-mouthed diverticula in entire colon.   EYE SURGERY     HEMORRHOID SURGERY N/A 12/06/2023   Procedure: 3 COLUMN HEMORRHOIDECTOMY;  Surgeon: Joyce Nixon, MD;  Location: [facility];  Service: General;  Laterality: N/A;   TONSILLECTOMY     age 89    TOTAL KNEE ARTHROPLASTY Left 01/16/2024   Procedure: ARTHROPLASTY, KNEE, TOTAL;  Surgeon: Claiborne Crew, MD;  Location: [facility];  Service: Orthopedics;  Laterality: Left;   Patient Active Problem List   Diagnosis Date Noted   S/P total knee arthroplasty, left 01/16/2024   Prolapsed internal hemorrhoids, grade 3 08/23/2022   Internal hemorrhoids 07/21/2022   Family  history of colon cancer 07/05/2021   Hiatal hernia 10/22/2020   Rectal bleeding 10/22/2020   Hospital discharge follow-up 01/10/2020   Sepsis secondary to UTI (HCC) 01/01/2020   GERD (gastroesophageal reflux disease)    Hyperlipidemia    Hypokalemia    Nodular prostate 08/07/2019   Benign prostatic hyperplasia with urinary frequency 08/07/2019   White coat syndrome with diagnosis of hypertension 08/07/2019   History of retinal tear 08/07/2019   Actinic keratoses 08/07/2019    PCP: Eliodoro Guerin, DO  REFERRING PROVIDER: Claiborne Crew, MD   REFERRING DIAG: Presence of left artificial knee joint   THERAPY DIAG:  Acute pain of left knee  Stiffness of left knee, not elsewhere classified  Localized edema  Rationale for Evaluation and Treatment: Rehabilitation  ONSET DATE: 01/16/24  SUBJECTIVE:   SUBJECTIVE STATEMENT: Patient reports that he had left knee replacement on 01/16/24. He feels that his knee is doing alright considering his surgery. He has been able to sleep at night without being awakened in the bed. He was able to do his HEP twice yesterday. He has been using ice to help with his pain some.   PERTINENT HISTORY: Hypertension and arthritis PAIN:  Are you having pain? Yes: NPRS scale: Current: 7/10 Best: 1/10 Worst: 9/10 Pain location: left knee Pain  description: sore Aggravating factors: transfers and beginning to move Relieving factors: ice, medication  PRECAUTIONS: None  RED FLAGS: None   WEIGHT BEARING RESTRICTIONS: No  FALLS:  Has patient fallen in last 6 months? No  LIVING ENVIRONMENT: Lives with: lives with their spouse Lives in: House/apartment Stairs: Yes: External: 3-4 steps; on left going up Has following equipment at home: Tourist information centre manager - 2 wheeled  OCCUPATION: retired  PLOF: Independent  PATIENT GOALS: improved mobility, be able to walk his dog and fish   NEXT MD VISIT: 01/31/24  OBJECTIVE:  Note: Objective  measures were completed at Evaluation unless otherwise noted.  PATIENT SURVEYS:  LEFS  Extreme difficulty/unable (0), Quite a bit of difficulty (1), Moderate difficulty (2), Little difficulty (3), No difficulty (4) Survey date:  01/19/24  Any of your usual work, housework or school activities 1  2. Usual hobbies, recreational or sporting activities 0  3. Getting into/out of the bath 1  4. Walking between rooms 3  5. Putting on socks/shoes 2  6. Squatting  0  7. Lifting an object, like a bag of groceries from the floor 0  8. Performing light activities around your home 3  9. Performing heavy activities around your home 0  10. Getting into/out of a car 1  11. Walking 2 blocks 0  12. Walking 1 mile 0  13. Going up/down 10 stairs (1 flight) 2  14. Standing for 1 hour 2  15.  sitting for 1 hour 3  16. Running on even ground 0  17. Running on uneven ground 0  18. Making sharp turns while running fast 0  19. Hopping  0  20. Rolling over in bed 2  Score total:  20/80     COGNITION: Overall cognitive status: Within functional limits for tasks assessed     SENSATION: Patient reports no numbness or tingling  EDEMA:  Circumferential: Left tibiofemoral joint line: 44.4 cm Right tibiofemoral joint line: 40.2 cm   PALPATION: TTP: left hip adductors   LOWER EXTREMITY ROM:  Active ROM Right eval Left eval  Hip flexion    Hip extension    Hip abduction    Hip adduction    Hip internal rotation    Hip external rotation    Knee flexion 144 92 PROM: 95  Knee extension 0 4  Ankle dorsiflexion    Ankle plantarflexion    Ankle inversion    Ankle eversion     (Blank rows = not tested)  LOWER EXTREMITY MMT: not tested due to surgical condition  GAIT: Assistive device utilized: Environmental consultant - 2 wheeled Level of assistance: Modified independence Comments: Step through pattern with reduced left knee flexion in swing phase, stride length, and gait speed                                                                                                                          TREATMENT DATE:     PATIENT  EDUCATION:  Education details: HEP, healing, objective findings, prognosis, plan of care, and goals for physical therapy Person educated: Patient and Spouse Education method: Explanation Education comprehension: verbalized understanding  HOME EXERCISE PROGRAM:   ASSESSMENT:  CLINICAL IMPRESSION: Patient is a 74 y.o. male who was seen today for physical therapy evaluation and treatment following a left total knee arthroplasty on 01/16/24.  He presented with moderate pain severity and remote irritability with none of today's assessments significantly aggravating his familiar symptoms.  His Ace bandage was removed and he exhibited no signs or symptoms of a postoperative complication.  His home health HEP was reviewed and he was able to properly recall these interventions.  Recommend that he continue with skilled physical therapy to address his impairments to return to his prior level of function.  OBJECTIVE IMPAIRMENTS: Abnormal gait, decreased activity tolerance, decreased mobility, difficulty walking, decreased ROM, decreased strength, hypomobility, impaired tone, and pain.   ACTIVITY LIMITATIONS: carrying, squatting, transfers, and locomotion level  PARTICIPATION LIMITATIONS: cleaning, shopping, and community activity  PERSONAL FACTORS: 1-2 comorbidities: Hypertension and arthritis are also affecting patient's functional outcome.   REHAB POTENTIAL: Excellent  CLINICAL DECISION MAKING: Stable/uncomplicated  EVALUATION COMPLEXITY: Low   GOALS: Goals reviewed with patient? Yes  LONG TERM GOALS: Target date: 02/14/24  Patient will be independent with his HEP. Baseline:  Goal status: INITIAL  2.  Patient will be able to demonstrate at least 120 degrees of active left knee flexion for improved function navigating steps. Baseline:  Goal status: INITIAL  3.  Patient  will be able to navigate at least 4 steps with a reciprocal pattern for improved household mobility. Baseline:  Goal status: INITIAL  4.  Patient will be able to ambulate without an assistive device with minimal to no gait deviations for improved functional mobility. Baseline:  Goal status: INITIAL  5.  Patient will improve his LEFS score to at least 50/80 for improved perceived function with his daily activities. Baseline:  Goal status: INITIAL  PLAN:  PT FREQUENCY: 2-3x/week  PT DURATION: 4 weeks  PLANNED INTERVENTIONS: 29562- PT Re-evaluation, 97750- Physical Performance Testing, 97110-Therapeutic exercises, 97530- Therapeutic activity, [id number]- Neuromuscular re-education, 97535- Self Care, 13086- Manual therapy, [id number]- Gait training, [phone]- Electrical stimulation (unattended), 97016- Vasopneumatic device, Patient/Family education, Balance training, Stair training, Joint mobilization, Cryotherapy, and Moist heat  PLAN FOR NEXT SESSION: NuStep, gait training, manual therapy, lower extremity strengthening, and modalities as needed   Lane Pinon, PT 01/19/2024, 1:22 PM

## 2024-01-23 ENCOUNTER — Ambulatory Visit: Attending: Orthopedic Surgery | Admitting: Physical Therapy

## 2024-01-23 DIAGNOSIS — M25562 Pain in left knee: Secondary | ICD-10-CM | POA: Diagnosis not present

## 2024-01-23 DIAGNOSIS — R600 Localized edema: Secondary | ICD-10-CM | POA: Diagnosis not present

## 2024-01-23 DIAGNOSIS — M25662 Stiffness of left knee, not elsewhere classified: Secondary | ICD-10-CM | POA: Diagnosis not present

## 2024-01-23 NOTE — Therapy (Signed)
 OUTPATIENT PHYSICAL THERAPY LOWER EXTREMITY TREATMENT  Patient Name: Ralph Stevens MRN: [id number] DOB:12/07/1950, 73 y.o., male Today's Date: 01/23/2024  END OF SESSION:  PT End of Session - 01/23/24 1606     Visit Number 2    Number of Visits 12    Date for PT Re-Evaluation 02/16/24    PT Start Time 0400    PT Stop Time 0456    PT Time Calculation (min) 56 min    Activity Tolerance Patient tolerated treatment well    Behavior During Therapy WFL for tasks assessed/performed             Past Medical History:  Diagnosis Date   Acid reflux    Arthritis    Arthritis    Cataract    Mixed form OU   Enlarged prostate    PSA goes up and down    Family history of adverse reaction to anesthesia    sister has had trouble waking up   GERD (gastroesophageal reflux disease)    High cholesterol    Hyperlipidemia    Hypertension    Hypertensive retinopathy    OU   Pneumonia    Sepsis [facility])    Past Surgical History:  Procedure Laterality Date   colonoscopy  2019   internal hemorrhoids and pancolonic diverticulosis. Surveillance due in 2024 due to history of adenomatous colon polyps and family history.   COLONOSCOPY N/A 06/23/2021   Hemorrhoids on perianal exam. 2 small 4 hemorrhoid tags, minimal internla hemorrhoids, scars appropriately located. Scattered medium-mouthed diverticula in entire colon.   EYE SURGERY     HEMORRHOID SURGERY N/A 12/06/2023   Procedure: 3 COLUMN HEMORRHOIDECTOMY;  Surgeon: Joyce Nixon, MD;  Location: [facility];  Service: General;  Laterality: N/A;   TONSILLECTOMY     age 72    TOTAL KNEE ARTHROPLASTY Left 01/16/2024   Procedure: ARTHROPLASTY, KNEE, TOTAL;  Surgeon: Claiborne Crew, MD;  Location: [facility];  Service: Orthopedics;  Laterality: Left;   Patient Active Problem List   Diagnosis Date Noted   S/P total knee arthroplasty, left 01/16/2024   Prolapsed internal hemorrhoids, grade 3 08/23/2022   Internal hemorrhoids 07/21/2022   Family  history of colon cancer 07/05/2021   Hiatal hernia 10/22/2020   Rectal bleeding 10/22/2020   Hospital discharge follow-up 01/10/2020   Sepsis secondary to UTI (HCC) 01/01/2020   GERD (gastroesophageal reflux disease)    Hyperlipidemia    Hypokalemia    Nodular prostate 08/07/2019   Benign prostatic hyperplasia with urinary frequency 08/07/2019   White coat syndrome with diagnosis of hypertension 08/07/2019   History of retinal tear 08/07/2019   Actinic keratoses 08/07/2019    PCP: Eliodoro Guerin, DO  REFERRING PROVIDER: Claiborne Crew, MD   REFERRING DIAG: Presence of left artificial knee joint   THERAPY DIAG:  Acute pain of left knee  Stiffness of left knee, not elsewhere classified  Localized edema  Rationale for Evaluation and Treatment: Rehabilitation  ONSET DATE: 01/16/24  SUBJECTIVE:   SUBJECTIVE STATEMENT: Doing good.  Pain about a 4.   PERTINENT HISTORY: Hypertension and arthritis PAIN:  Are you having pain? Yes: NPRS scale: Current: 5/10 Best: 1/10 Worst: 9/10 Pain location: left knee Pain description: sore Aggravating factors: transfers and beginning to move Relieving factors: ice, medication  PRECAUTIONS: None  RED FLAGS: None   WEIGHT BEARING RESTRICTIONS: No  FALLS:  Has patient fallen in last 6 months? No  LIVING ENVIRONMENT: Lives with: lives with their spouse Lives in: House/apartment  Stairs: Yes: External: 3-4 steps; on left going up Has following equipment at home: Quad cane large base and Walker - 2 wheeled  OCCUPATION: retired  PLOF: Independent  PATIENT GOALS: improved mobility, be able to walk his dog and fish   NEXT MD VISIT: 01/31/24  OBJECTIVE:  Note: Objective measures were completed at Evaluation unless otherwise noted.  PATIENT SURVEYS:  LEFS  Extreme difficulty/unable (0), Quite a bit of difficulty (1), Moderate difficulty (2), Little difficulty (3), No difficulty (4) Survey date:  01/19/24  Any of your usual  work, housework or school activities 1  2. Usual hobbies, recreational or sporting activities 0  3. Getting into/out of the bath 1  4. Walking between rooms 3  5. Putting on socks/shoes 2  6. Squatting  0  7. Lifting an object, like a bag of groceries from the floor 0  8. Performing light activities around your home 3  9. Performing heavy activities around your home 0  10. Getting into/out of a car 1  11. Walking 2 blocks 0  12. Walking 1 mile 0  13. Going up/down 10 stairs (1 flight) 2  14. Standing for 1 hour 2  15.  sitting for 1 hour 3  16. Running on even ground 0  17. Running on uneven ground 0  18. Making sharp turns while running fast 0  19. Hopping  0  20. Rolling over in bed 2  Score total:  20/80     COGNITION: Overall cognitive status: Within functional limits for tasks assessed     SENSATION: Patient reports no numbness or tingling  EDEMA:  Circumferential: Left tibiofemoral joint line: 44.4 cm Right tibiofemoral joint line: 40.2 cm   PALPATION: TTP: left hip adductors   LOWER EXTREMITY ROM:  Active ROM Right eval Left eval  Hip flexion    Hip extension    Hip abduction    Hip adduction    Hip internal rotation    Hip external rotation    Knee flexion 144 92 PROM: 95  Knee extension 0 4  Ankle dorsiflexion    Ankle plantarflexion    Ankle inversion    Ankle eversion     (Blank rows = not tested)  LOWER EXTREMITY MMT: not tested due to surgical condition  GAIT: Assistive device utilized: Environmental consultant - 2 wheeled Level of assistance: Modified independence Comments: Step through pattern with reduced left knee flexion in swing phase, stride length, and gait speed                                                                                                                         TREATMENT DATE:    01/23/24:                                       EXERCISE LOG  Exercise Repetitions and Resistance Comments  Nustep Level 1 progressing  to seat 9 over  15 minutes.   Rockerboard  4 minutes   Seated knee glide 4 minutes           In supine:  Low load long duration stretching x 4 minutes into left knee flexion f/b LE elevation and vasopneumatic on low x 20 minutes.     PATIENT EDUCATION:  Education details: HEP, healing, objective findings, prognosis, plan of care, and goals for physical therapy Person educated: Patient and Spouse Education method: Explanation Education comprehension: verbalized understanding  HOME EXERCISE PROGRAM:   ASSESSMENT:  CLINICAL IMPRESSION: The patient is very pleased with his surgical outcome an did very well today with all interventions performing without complaint.    OBJECTIVE IMPAIRMENTS: Abnormal gait, decreased activity tolerance, decreased mobility, difficulty walking, decreased ROM, decreased strength, hypomobility, impaired tone, and pain.   ACTIVITY LIMITATIONS: carrying, squatting, transfers, and locomotion level  PARTICIPATION LIMITATIONS: cleaning, shopping, and community activity  PERSONAL FACTORS: 1-2 comorbidities: Hypertension and arthritis are also affecting patient's functional outcome.   REHAB POTENTIAL: Excellent  CLINICAL DECISION MAKING: Stable/uncomplicated  EVALUATION COMPLEXITY: Low   GOALS: Goals reviewed with patient? Yes  LONG TERM GOALS: Target date: 02/14/24  Patient will be independent with his HEP. Baseline:  Goal status: INITIAL  2.  Patient will be able to demonstrate at least 120 degrees of active left knee flexion for improved function navigating steps. Baseline:  Goal status: INITIAL  3.  Patient will be able to navigate at least 4 steps with a reciprocal pattern for improved household mobility. Baseline:  Goal status: INITIAL  4.  Patient will be able to ambulate without an assistive device with minimal to no gait deviations for improved functional mobility. Baseline:  Goal status: INITIAL  5.  Patient will improve his LEFS score to at least  50/80 for improved perceived function with his daily activities. Baseline:  Goal status: INITIAL  PLAN:  PT FREQUENCY: 2-3x/week  PT DURATION: 4 weeks  PLANNED INTERVENTIONS: 97164- PT Re-evaluation, 97750- Physical Performance Testing, 97110-Therapeutic exercises, 97530- Therapeutic activity, [id number]- Neuromuscular re-education, 97535- Self Care, 16109- Manual therapy, [phone]- Gait training, [phone]- Electrical stimulation (unattended), 97016- Vasopneumatic device, Patient/Family education, Balance training, Stair training, Joint mobilization, Cryotherapy, and Moist heat  PLAN FOR NEXT SESSION: NuStep, gait training, manual therapy, lower extremity strengthening, and modalities as needed   Rashelle Ireland, [place], PT 01/23/2024, 6:07 PM

## 2024-01-26 ENCOUNTER — Ambulatory Visit

## 2024-01-26 DIAGNOSIS — R600 Localized edema: Secondary | ICD-10-CM | POA: Diagnosis not present

## 2024-01-26 DIAGNOSIS — M25562 Pain in left knee: Secondary | ICD-10-CM

## 2024-01-26 DIAGNOSIS — M25662 Stiffness of left knee, not elsewhere classified: Secondary | ICD-10-CM

## 2024-01-26 NOTE — Therapy (Signed)
 OUTPATIENT PHYSICAL THERAPY LOWER EXTREMITY TREATMENT  Patient Name: Ralph Stevens MRN: [id number] DOB:1951/07/05, 73 y.o., male Today's Date: 01/26/2024  END OF SESSION:  PT End of Session - 01/26/24 1029     Visit Number 3    Number of Visits 12    Date for PT Re-Evaluation 02/16/24    PT Start Time 1014    PT Stop Time 1105    PT Time Calculation (min) 51 min    Activity Tolerance Patient tolerated treatment well    Behavior During Therapy WFL for tasks assessed/performed              Past Medical History:  Diagnosis Date   Acid reflux    Arthritis    Arthritis    Cataract    Mixed form OU   Enlarged prostate    PSA goes up and down    Family history of adverse reaction to anesthesia    sister has had trouble waking up   GERD (gastroesophageal reflux disease)    High cholesterol    Hyperlipidemia    Hypertension    Hypertensive retinopathy    OU   Pneumonia    Sepsis (HCC)    Past Surgical History:  Procedure Laterality Date   colonoscopy  2019   internal hemorrhoids and pancolonic diverticulosis. Surveillance due in 2024 due to history of adenomatous colon polyps and family history.   COLONOSCOPY N/A 06/23/2021   Hemorrhoids on perianal exam. 2 small 4 hemorrhoid tags, minimal internla hemorrhoids, scars appropriately located. Scattered medium-mouthed diverticula in entire colon.   EYE SURGERY     HEMORRHOID SURGERY N/A 12/06/2023   Procedure: 3 COLUMN HEMORRHOIDECTOMY;  Surgeon: Joyce Nixon, MD;  Location: [facility];  Service: General;  Laterality: N/A;   TONSILLECTOMY     age 75    TOTAL KNEE ARTHROPLASTY Left 01/16/2024   Procedure: ARTHROPLASTY, KNEE, TOTAL;  Surgeon: Claiborne Crew, MD;  Location: [facility];  Service: Orthopedics;  Laterality: Left;   Patient Active Problem List   Diagnosis Date Noted   S/P total knee arthroplasty, left 01/16/2024   Prolapsed internal hemorrhoids, grade 3 08/23/2022   Internal hemorrhoids 07/21/2022   Family  history of colon cancer 07/05/2021   Hiatal hernia 10/22/2020   Rectal bleeding 10/22/2020   Hospital discharge follow-up 01/10/2020   Sepsis secondary to UTI (HCC) 01/01/2020   GERD (gastroesophageal reflux disease)    Hyperlipidemia    Hypokalemia    Nodular prostate 08/07/2019   Benign prostatic hyperplasia with urinary frequency 08/07/2019   White coat syndrome with diagnosis of hypertension 08/07/2019   History of retinal tear 08/07/2019   Actinic keratoses 08/07/2019    PCP: Eliodoro Guerin, DO  REFERRING PROVIDER: Claiborne Crew, MD   REFERRING DIAG: Presence of left artificial knee joint   THERAPY DIAG:  Acute pain of left knee  Stiffness of left knee, not elsewhere classified  Localized edema  Rationale for Evaluation and Treatment: Rehabilitation  ONSET DATE: 01/16/24  SUBJECTIVE:   SUBJECTIVE STATEMENT: Patient reports that his knee was hurting last night, but it is alright today.     PERTINENT HISTORY: Hypertension and arthritis PAIN:  Are you having pain? Yes: NPRS scale: Current: 5/10 Best: 1/10 Worst: 9/10 Pain location: left knee Pain description: sore Aggravating factors: transfers and beginning to move Relieving factors: ice, medication  PRECAUTIONS: None  RED FLAGS: None   WEIGHT BEARING RESTRICTIONS: No  FALLS:  Has patient fallen in last 6 months? No  LIVING  ENVIRONMENT: Lives with: lives with their spouse Lives in: House/apartment Stairs: Yes: External: 3-4 steps; on left going up Has following equipment at home: Tourist information centre manager - 2 wheeled  OCCUPATION: retired  PLOF: Independent  PATIENT GOALS: improved mobility, be able to walk his dog and fish   NEXT MD VISIT: 01/31/24  OBJECTIVE:  Note: Objective measures were completed at Evaluation unless otherwise noted.  PATIENT SURVEYS:  LEFS  Extreme difficulty/unable (0), Quite a bit of difficulty (1), Moderate difficulty (2), Little difficulty (3), No  difficulty (4) Survey date:  01/19/24  Any of your usual work, housework or school activities 1  2. Usual hobbies, recreational or sporting activities 0  3. Getting into/out of the bath 1  4. Walking between rooms 3  5. Putting on socks/shoes 2  6. Squatting  0  7. Lifting an object, like a bag of groceries from the floor 0  8. Performing light activities around your home 3  9. Performing heavy activities around your home 0  10. Getting into/out of a car 1  11. Walking 2 blocks 0  12. Walking 1 mile 0  13. Going up/down 10 stairs (1 flight) 2  14. Standing for 1 hour 2  15.  sitting for 1 hour 3  16. Running on even ground 0  17. Running on uneven ground 0  18. Making sharp turns while running fast 0  19. Hopping  0  20. Rolling over in bed 2  Score total:  20/80     COGNITION: Overall cognitive status: Within functional limits for tasks assessed     SENSATION: Patient reports no numbness or tingling  EDEMA:  Circumferential: Left tibiofemoral joint line: 44.4 cm Right tibiofemoral joint line: 40.2 cm   PALPATION: TTP: left hip adductors   LOWER EXTREMITY ROM:  Active ROM Right eval Left eval  Hip flexion    Hip extension    Hip abduction    Hip adduction    Hip internal rotation    Hip external rotation    Knee flexion 144 92 PROM: 95  Knee extension 0 4  Ankle dorsiflexion    Ankle plantarflexion    Ankle inversion    Ankle eversion     (Blank rows = not tested)  LOWER EXTREMITY MMT: not tested due to surgical condition  GAIT: Assistive device utilized: Environmental consultant - 2 wheeled Level of assistance: Modified independence Comments: Step through pattern with reduced left knee flexion in swing phase, stride length, and gait speed                                                                                                                         TREATMENT DATE:                                     01/26/24 EXERCISE LOG  Exercise Repetitions and Resistance  Comments  Nustep  L4 x 15 minutes @ seat 11-9   Rocker board 3 minutes   Lunges onto step  8" step x 2 minutes  For knee flexion   Static stance on foam  3 minutes  Narrow BOS       Blank cell = exercise not performed today  Modalities: no redness or adverse reaction to today's modalities  Date:  Vaso: Knee, 34 degrees; low pressure, 15 mins, Pain and Edema  01/23/24:  EXERCISE LOG  Exercise Repetitions and Resistance Comments  Nustep Level 1 progressing to seat 9 over 15 minutes.   Rockerboard  4 minutes   Seated knee glide 4 minutes           In supine:  Low load long duration stretching x 4 minutes into left knee flexion f/b LE elevation and vasopneumatic on low x 20 minutes.     PATIENT EDUCATION:  Education details:  Person educated: Patient and Spouse Education method: Explanation Education comprehension: verbalized understanding  HOME EXERCISE PROGRAM:   ASSESSMENT:  CLINICAL IMPRESSION: Patient was introduced to static stance on foam and lunges onto a step for improved static stability and knee flexion. He required minimal cueing with lunges onto a step for proper exercise performance to promote improved knee mobility. He reported that his knee felt good upon the conclusion of treatment. He continues to require skilled physical therapy to address his remaining impairments to return to his prior level of function.   OBJECTIVE IMPAIRMENTS: Abnormal gait, decreased activity tolerance, decreased mobility, difficulty walking, decreased ROM, decreased strength, hypomobility, impaired tone, and pain.   ACTIVITY LIMITATIONS: carrying, squatting, transfers, and locomotion level  PARTICIPATION LIMITATIONS: cleaning, shopping, and community activity  PERSONAL FACTORS: 1-2 comorbidities: Hypertension and arthritis are also affecting patient's functional outcome.   REHAB POTENTIAL: Excellent  CLINICAL DECISION MAKING: Stable/uncomplicated  EVALUATION COMPLEXITY:  Low   GOALS: Goals reviewed with patient? Yes  LONG TERM GOALS: Target date: 02/14/24  Patient will be independent with his HEP. Baseline:  Goal status: INITIAL  2.  Patient will be able to demonstrate at least 120 degrees of active left knee flexion for improved function navigating steps. Baseline:  Goal status: INITIAL  3.  Patient will be able to navigate at least 4 steps with a reciprocal pattern for improved household mobility. Baseline:  Goal status: INITIAL  4.  Patient will be able to ambulate without an assistive device with minimal to no gait deviations for improved functional mobility. Baseline:  Goal status: INITIAL  5.  Patient will improve his LEFS score to at least 50/80 for improved perceived function with his daily activities. Baseline:  Goal status: INITIAL  PLAN:  PT FREQUENCY: 2-3x/week  PT DURATION: 4 weeks  PLANNED INTERVENTIONS: 16109- PT Re-evaluation, 97750- Physical Performance Testing, 97110-Therapeutic exercises, 97530- Therapeutic activity, [id number]- Neuromuscular re-education, 97535- Self Care, 60454- Manual therapy, [phone]- Gait training, [phone]- Electrical stimulation (unattended), 97016- Vasopneumatic device, Patient/Family education, Balance training, Stair training, Joint mobilization, Cryotherapy, and Moist heat  PLAN FOR NEXT SESSION: NuStep, gait training, manual therapy, lower extremity strengthening, and modalities as needed   Lane Pinon, PT 01/26/2024, 11:15 AM

## 2024-01-29 ENCOUNTER — Encounter

## 2024-02-02 ENCOUNTER — Ambulatory Visit

## 2024-02-02 DIAGNOSIS — M25562 Pain in left knee: Secondary | ICD-10-CM

## 2024-02-02 DIAGNOSIS — M25662 Stiffness of left knee, not elsewhere classified: Secondary | ICD-10-CM

## 2024-02-02 DIAGNOSIS — R600 Localized edema: Secondary | ICD-10-CM

## 2024-02-02 NOTE — Therapy (Signed)
 OUTPATIENT PHYSICAL THERAPY LOWER EXTREMITY TREATMENT  Patient Name: Harvel Meskill MRN: [id number] DOB:Oct 12, 1950, 73 y.o., male Today's Date: 02/02/2024  END OF SESSION:  PT End of Session - 02/02/24 1017     Visit Number 4    Number of Visits 12    Date for PT Re-Evaluation 02/16/24    PT Start Time 1014    PT Stop Time 1111    PT Time Calculation (min) 57 min    Activity Tolerance Patient tolerated treatment well    Behavior During Therapy WFL for tasks assessed/performed           Past Medical History:  Diagnosis Date   Acid reflux    Arthritis    Arthritis    Cataract    Mixed form OU   Enlarged prostate    PSA goes up and down    Family history of adverse reaction to anesthesia    sister has had trouble waking up   GERD (gastroesophageal reflux disease)    High cholesterol    Hyperlipidemia    Hypertension    Hypertensive retinopathy    OU   Pneumonia    Sepsis (HCC)    Past Surgical History:  Procedure Laterality Date   colonoscopy  2019   internal hemorrhoids and pancolonic diverticulosis. Surveillance due in 2024 due to history of adenomatous colon polyps and family history.   COLONOSCOPY N/A 06/23/2021   Hemorrhoids on perianal exam. 2 small 4 hemorrhoid tags, minimal internla hemorrhoids, scars appropriately located. Scattered medium-mouthed diverticula in entire colon.   EYE SURGERY     HEMORRHOID SURGERY N/A 12/06/2023   Procedure: 3 COLUMN HEMORRHOIDECTOMY;  Surgeon: Joyce Nixon, MD;  Location: [facility];  Service: General;  Laterality: N/A;   TONSILLECTOMY     age 42    TOTAL KNEE ARTHROPLASTY Left 01/16/2024   Procedure: ARTHROPLASTY, KNEE, TOTAL;  Surgeon: Claiborne Crew, MD;  Location: [facility];  Service: Orthopedics;  Laterality: Left;   Patient Active Problem List   Diagnosis Date Noted   S/P total knee arthroplasty, left 01/16/2024   Prolapsed internal hemorrhoids, grade 3 08/23/2022   Internal hemorrhoids 07/21/2022   Family  history of colon cancer 07/05/2021   Hiatal hernia 10/22/2020   Rectal bleeding 10/22/2020   Hospital discharge follow-up 01/10/2020   Sepsis secondary to UTI (HCC) 01/01/2020   GERD (gastroesophageal reflux disease)    Hyperlipidemia    Hypokalemia    Nodular prostate 08/07/2019   Benign prostatic hyperplasia with urinary frequency 08/07/2019   White coat syndrome with diagnosis of hypertension 08/07/2019   History of retinal tear 08/07/2019   Actinic keratoses 08/07/2019    PCP: Eliodoro Guerin, DO  REFERRING PROVIDER: Claiborne Crew, MD   REFERRING DIAG: Presence of left artificial knee joint   THERAPY DIAG:  Acute pain of left knee  Stiffness of left knee, not elsewhere classified  Localized edema  Rationale for Evaluation and Treatment: Rehabilitation  ONSET DATE: 01/16/24  SUBJECTIVE:   SUBJECTIVE STATEMENT: Patient denies any pain today.  Pt reports having a stomach bug earlier this week which caused him to miss his appt Monday.     PERTINENT HISTORY: Hypertension and arthritis PAIN:  Are you having pain? No  PRECAUTIONS: None  RED FLAGS: None   WEIGHT BEARING RESTRICTIONS: No  FALLS:  Has patient fallen in last 6 months? No  LIVING ENVIRONMENT: Lives with: lives with their spouse Lives in: House/apartment Stairs: Yes: External: 3-4 steps; on left going up Has  following equipment at home: Tourist information centre manager - 2 wheeled  OCCUPATION: retired  PLOF: Independent  PATIENT GOALS: improved mobility, be able to walk his dog and fish   NEXT MD VISIT: 01/31/24  OBJECTIVE:  Note: Objective measures were completed at Evaluation unless otherwise noted.  PATIENT SURVEYS:  LEFS  Extreme difficulty/unable (0), Quite a bit of difficulty (1), Moderate difficulty (2), Little difficulty (3), No difficulty (4) Survey date:  01/19/24  Any of your usual work, housework or school activities 1  2. Usual hobbies, recreational or sporting activities  0  3. Getting into/out of the bath 1  4. Walking between rooms 3  5. Putting on socks/shoes 2  6. Squatting  0  7. Lifting an object, like a bag of groceries from the floor 0  8. Performing light activities around your home 3  9. Performing heavy activities around your home 0  10. Getting into/out of a car 1  11. Walking 2 blocks 0  12. Walking 1 mile 0  13. Going up/down 10 stairs (1 flight) 2  14. Standing for 1 hour 2  15.  sitting for 1 hour 3  16. Running on even ground 0  17. Running on uneven ground 0  18. Making sharp turns while running fast 0  19. Hopping  0  20. Rolling over in bed 2  Score total:  20/80     COGNITION: Overall cognitive status: Within functional limits for tasks assessed     SENSATION: Patient reports no numbness or tingling  EDEMA:  Circumferential: Left tibiofemoral joint line: 44.4 cm Right tibiofemoral joint line: 40.2 cm   PALPATION: TTP: left hip adductors   LOWER EXTREMITY ROM:  Active ROM Right eval Left eval  Hip flexion    Hip extension    Hip abduction    Hip adduction    Hip internal rotation    Hip external rotation    Knee flexion 144 92 PROM: 95  Knee extension 0 4  Ankle dorsiflexion    Ankle plantarflexion    Ankle inversion    Ankle eversion     (Blank rows = not tested)  LOWER EXTREMITY MMT: not tested due to surgical condition  GAIT: Assistive device utilized: Environmental consultant - 2 wheeled Level of assistance: Modified independence Comments: Step through pattern with reduced left knee flexion in swing phase, stride length, and gait speed                                                                                                                         TREATMENT DATE:    02/02/24    EXERCISE LOG  Exercise Repetitions and Resistance Comments  Nustep  L4 x 15 minutes @ seat 10-8   Rocker board 4 minutes   Lunges onto step  14 box x 3 mins For knee flexion   Forward Step Ups 6 box x 25 reps   LAQs 2# x 25 reps  Seated Marches 2# x 25 reps    Seated Ham Curls Red x 25 reps    Static stance on foam   Narrow BOS  STS 10 reps no UE support    Blank cell = exercise not performed today  Modalities: no redness or adverse reaction to today's modalities  Date:  Vaso: Knee, 34 degrees; low pressure, 15 mins, Pain and Edema  01/23/24:  EXERCISE LOG  Exercise Repetitions and Resistance Comments  Nustep Level 1 progressing to seat 9 over 15 minutes.   Rockerboard  4 minutes   Seated knee glide 4 minutes           In supine:  Low load long duration stretching x 4 minutes into left knee flexion f/b LE elevation and vasopneumatic on low x 20 minutes.     PATIENT EDUCATION:  Education details:  Person educated: Patient and Spouse Education method: Explanation Education comprehension: verbalized understanding  HOME EXERCISE PROGRAM:   ASSESSMENT:  CLINICAL IMPRESSION: Pt arrives for today's treatment session denying any pain.  Pt ambulates into the facility carrying his cane.  Pt introduced to several standing and seated exercises today with min cues required for proper technique.  Pt reports that he has been doing his exercises at home regularly.  Normal responses to vaso noted upon removal.  Pt denied any pain at completion of today's treatment session.   OBJECTIVE IMPAIRMENTS: Abnormal gait, decreased activity tolerance, decreased mobility, difficulty walking, decreased ROM, decreased strength, hypomobility, impaired tone, and pain.   ACTIVITY LIMITATIONS: carrying, squatting, transfers, and locomotion level  PARTICIPATION LIMITATIONS: cleaning, shopping, and community activity  PERSONAL FACTORS: 1-2 comorbidities: Hypertension and arthritis are also affecting patient's functional outcome.   REHAB POTENTIAL: Excellent  CLINICAL DECISION MAKING: Stable/uncomplicated  EVALUATION COMPLEXITY: Low   GOALS: Goals reviewed with patient? Yes  LONG TERM GOALS: Target date: 02/14/24  Patient  will be independent with his HEP. Baseline:  Goal status: INITIAL  2.  Patient will be able to demonstrate at least 120 degrees of active left knee flexion for improved function navigating steps. Baseline:  Goal status: INITIAL  3.  Patient will be able to navigate at least 4 steps with a reciprocal pattern for improved household mobility. Baseline:  Goal status: INITIAL  4.  Patient will be able to ambulate without an assistive device with minimal to no gait deviations for improved functional mobility. Baseline:  Goal status: INITIAL  5.  Patient will improve his LEFS score to at least 50/80 for improved perceived function with his daily activities. Baseline:  Goal status: INITIAL  PLAN:  PT FREQUENCY: 2-3x/week  PT DURATION: 4 weeks  PLANNED INTERVENTIONS: 78295- PT Re-evaluation, 97750- Physical Performance Testing, 97110-Therapeutic exercises, 97530- Therapeutic activity, 97112- Neuromuscular re-education, 97535- Self Care, 62130- Manual therapy, [phone]- Gait training, [phone]- Electrical stimulation (unattended), 97016- Vasopneumatic device, Patient/Family education, Balance training, Stair training, Joint mobilization, Cryotherapy, and Moist heat  PLAN FOR NEXT SESSION: NuStep, gait training, manual therapy, lower extremity strengthening, and modalities as needed   Deryl Flora, PTA 02/02/2024, 12:05 PM

## 2024-02-05 ENCOUNTER — Encounter: Payer: Self-pay | Admitting: *Deleted

## 2024-02-05 ENCOUNTER — Ambulatory Visit: Admitting: *Deleted

## 2024-02-05 DIAGNOSIS — R600 Localized edema: Secondary | ICD-10-CM | POA: Diagnosis not present

## 2024-02-05 DIAGNOSIS — M25562 Pain in left knee: Secondary | ICD-10-CM | POA: Diagnosis not present

## 2024-02-05 DIAGNOSIS — M25662 Stiffness of left knee, not elsewhere classified: Secondary | ICD-10-CM

## 2024-02-05 NOTE — Therapy (Signed)
 OUTPATIENT PHYSICAL THERAPY LOWER EXTREMITY TREATMENT  Patient Name: Ralph Stevens MRN: [id number] DOB:10/03/1950, 73 y.o., male Today's Date: 02/05/2024  END OF SESSION:  PT End of Session - 02/05/24 1524     Visit Number 5    Number of Visits 12    Date for PT Re-Evaluation 02/16/24    PT Start Time 1515    PT Stop Time 1615    PT Time Calculation (min) 60 min            Past Medical History:  Diagnosis Date   Acid reflux    Arthritis    Arthritis    Cataract    Mixed form OU   Enlarged prostate    PSA goes up and down    Family history of adverse reaction to anesthesia    sister has had trouble waking up   GERD (gastroesophageal reflux disease)    High cholesterol    Hyperlipidemia    Hypertension    Hypertensive retinopathy    OU   Pneumonia    Sepsis [facility])    Past Surgical History:  Procedure Laterality Date   colonoscopy  2019   internal hemorrhoids and pancolonic diverticulosis. Surveillance due in 2024 due to history of adenomatous colon polyps and family history.   COLONOSCOPY N/A 06/23/2021   Hemorrhoids on perianal exam. 2 small 4 hemorrhoid tags, minimal internla hemorrhoids, scars appropriately located. Scattered medium-mouthed diverticula in entire colon.   EYE SURGERY     HEMORRHOID SURGERY N/A 12/06/2023   Procedure: 3 COLUMN HEMORRHOIDECTOMY;  Surgeon: Joyce Nixon, MD;  Location: [facility];  Service: General;  Laterality: N/A;   TONSILLECTOMY     age 73    TOTAL KNEE ARTHROPLASTY Left 01/16/2024   Procedure: ARTHROPLASTY, KNEE, TOTAL;  Surgeon: Claiborne Crew, MD;  Location: [facility];  Service: Orthopedics;  Laterality: Left;   Patient Active Problem List   Diagnosis Date Noted   S/P total knee arthroplasty, left 01/16/2024   Prolapsed internal hemorrhoids, grade 3 08/23/2022   Internal hemorrhoids 07/21/2022   Family history of colon cancer 07/05/2021   Hiatal hernia 10/22/2020   Rectal bleeding 10/22/2020   Hospital discharge  follow-up 01/10/2020   Sepsis secondary to UTI (HCC) 01/01/2020   GERD (gastroesophageal reflux disease)    Hyperlipidemia    Hypokalemia    Nodular prostate 08/07/2019   Benign prostatic hyperplasia with urinary frequency 08/07/2019   White coat syndrome with diagnosis of hypertension 08/07/2019   History of retinal tear 08/07/2019   Actinic keratoses 08/07/2019    PCP: Eliodoro Guerin, DO  REFERRING PROVIDER: Claiborne Crew, MD   REFERRING DIAG: Presence of left artificial knee joint   THERAPY DIAG:  Acute pain of left knee  Stiffness of left knee, not elsewhere classified  Localized edema  Rationale for Evaluation and Treatment: Rehabilitation  ONSET DATE: 01/16/24  SUBJECTIVE:   SUBJECTIVE STATEMENT: Patient Reports doing good after last Rx LT knee PERTINENT HISTORY: Hypertension and arthritis PAIN:  Are you having pain? No  PRECAUTIONS: None  RED FLAGS: None   WEIGHT BEARING RESTRICTIONS: No  FALLS:  Has patient fallen in last 6 months? No  LIVING ENVIRONMENT: Lives with: lives with their spouse Lives in: House/apartment Stairs: Yes: External: 3-4 steps; on left going up Has following equipment at home: Tourist information centre manager - 2 wheeled  OCCUPATION: retired  PLOF: Independent  PATIENT GOALS: improved mobility, be able to walk his dog and fish   NEXT MD  VISIT: 01/31/24  OBJECTIVE:  Note: Objective measures were completed at Evaluation unless otherwise noted.  PATIENT SURVEYS:  LEFS  Extreme difficulty/unable (0), Quite a bit of difficulty (1), Moderate difficulty (2), Little difficulty (3), No difficulty (4) Survey date:  01/19/24  Any of your usual work, housework or school activities 1  2. Usual hobbies, recreational or sporting activities 0  3. Getting into/out of the bath 1  4. Walking between rooms 3  5. Putting on socks/shoes 2  6. Squatting  0  7. Lifting an object, like a bag of groceries from the floor 0  8. Performing  light activities around your home 3  9. Performing heavy activities around your home 0  10. Getting into/out of a car 1  11. Walking 2 blocks 0  12. Walking 1 mile 0  13. Going up/down 10 stairs (1 flight) 2  14. Standing for 1 hour 2  15.  sitting for 1 hour 3  16. Running on even ground 0  17. Running on uneven ground 0  18. Making sharp turns while running fast 0  19. Hopping  0  20. Rolling over in bed 2  Score total:  20/80     COGNITION: Overall cognitive status: Within functional limits for tasks assessed     SENSATION: Patient reports no numbness or tingling  EDEMA:  Circumferential: Left tibiofemoral joint line: 44.4 cm Right tibiofemoral joint line: 40.2 cm   PALPATION: TTP: left hip adductors   LOWER EXTREMITY ROM:  Active ROM Right eval Left eval  Hip flexion    Hip extension    Hip abduction    Hip adduction    Hip internal rotation    Hip external rotation    Knee flexion 144 92 PROM: 95  Knee extension 0 4  Ankle dorsiflexion    Ankle plantarflexion    Ankle inversion    Ankle eversion     (Blank rows = not tested)  LOWER EXTREMITY MMT: not tested due to surgical condition  GAIT: Assistive device utilized: Environmental consultant - 2 wheeled Level of assistance: Modified independence Comments: Step through pattern with reduced left knee flexion in swing phase, stride length, and gait speed                                                                                                                         TREATMENT DATE:    02/05/24    EXERCISE LOG   LT knee  Exercise Repetitions and Resistance Comments  Nustep  L4 x 15 minutes @ seat 10-8   Rocker board 4 minutes   Lunges onto step  14 box x 3 mins For knee flexion   Forward Step Ups and side  6 box  3 x 10 reps   LAQs 2# x 25 reps   Seated Marches    Seated Ham Curls Red x 25 reps    Static stance on foam   Narrow BOS  STS  X 5  Blank cell = exercise not performed today  Modalities: no  redness or adverse reaction to today's modalities  Date:  Vaso: Knee, 34 degrees; low pressure, 15 mins, Pain and Edema  01/23/24:  EXERCISE LOG  Exercise Repetitions and Resistance Comments  Nustep Level 1 progressing to seat 9 over 15 minutes.   Rockerboard  4 minutes   Seated knee glide 4 minutes           In supine:  Low load long duration stretching x 4 minutes into left knee flexion f/b LE elevation and vasopneumatic on low x 20 minutes.     PATIENT EDUCATION:  Education details:  Person educated: Patient and Spouse Education method: Explanation Education comprehension: verbalized understanding  HOME EXERCISE PROGRAM:   ASSESSMENT:  CLINICAL IMPRESSION: Pt arrives for today's treatment session denying any pain and was able to continue with LT LE ROM progressions and mm activation exs and did great. Vaso end of session.      OBJECTIVE IMPAIRMENTS: Abnormal gait, decreased activity tolerance, decreased mobility, difficulty walking, decreased ROM, decreased strength, hypomobility, impaired tone, and pain.   ACTIVITY LIMITATIONS: carrying, squatting, transfers, and locomotion level  PARTICIPATION LIMITATIONS: cleaning, shopping, and community activity  PERSONAL FACTORS: 1-2 comorbidities: Hypertension and arthritis are also affecting patient's functional outcome.   REHAB POTENTIAL: Excellent  CLINICAL DECISION MAKING: Stable/uncomplicated  EVALUATION COMPLEXITY: Low   GOALS: Goals reviewed with patient? Yes  LONG TERM GOALS: Target date: 02/14/24  Patient will be independent with his HEP. Baseline:  Goal status: Partially met  2.  Patient will be able to demonstrate at least 120 degrees of active left knee flexion for improved function navigating steps. Baseline:  Goal status: On going  3.  Patient will be able to navigate at least 4 steps with a reciprocal pattern for improved household mobility. Baseline:  Goal status: On going  4.  Patient will be  able to ambulate without an assistive device with minimal to no gait deviations for improved functional mobility. Baseline:  Goal status: partially met  5.  Patient will improve his LEFS score to at least 50/80 for improved perceived function with his daily activities. Baseline:  Goal status: On going  PLAN:  PT FREQUENCY: 2-3x/week  PT DURATION: 4 weeks  PLANNED INTERVENTIONS: 97164- PT Re-evaluation, 97750- Physical Performance Testing, 97110-Therapeutic exercises, 97530- Therapeutic activity, [id number]- Neuromuscular re-education, 97535- Self Care, 16109- Manual therapy, [phone]- Gait training, [phone]- Electrical stimulation (unattended), 97016- Vasopneumatic device, Patient/Family education, Balance training, Stair training, Joint mobilization, Cryotherapy, and Moist heat  PLAN FOR NEXT SESSION: NuStep, gait training, manual therapy, lower extremity strengthening, and modalities as needed   Lafaye Mcelmurry,CHRIS, PTA 02/05/2024, 4:56 PM

## 2024-02-09 ENCOUNTER — Ambulatory Visit: Admitting: Physical Therapy

## 2024-02-09 DIAGNOSIS — R600 Localized edema: Secondary | ICD-10-CM

## 2024-02-09 DIAGNOSIS — M25562 Pain in left knee: Secondary | ICD-10-CM | POA: Diagnosis not present

## 2024-02-09 DIAGNOSIS — M25662 Stiffness of left knee, not elsewhere classified: Secondary | ICD-10-CM | POA: Diagnosis not present

## 2024-02-09 NOTE — Therapy (Addendum)
 OUTPATIENT PHYSICAL THERAPY LOWER EXTREMITY TREATMENT  Patient Name: Ralph Stevens MRN: [id number] DOB:July 31, 1951, 73 y.o., male Today's Date: 02/09/2024  END OF SESSION:  PT End of Session - 02/09/24 1035     Visit Number 7    Number of Visits 12    Date for PT Re-Evaluation 02/16/24    PT Start Time 1017    PT Stop Time 1106    PT Time Calculation (min) 49 min    Activity Tolerance Patient tolerated treatment well    Behavior During Therapy WFL for tasks assessed/performed            Past Medical History:  Diagnosis Date   Acid reflux    Arthritis    Arthritis    Cataract    Mixed form OU   Enlarged prostate    PSA goes up and down    Family history of adverse reaction to anesthesia    sister has had trouble waking up   GERD (gastroesophageal reflux disease)    High cholesterol    Hyperlipidemia    Hypertension    Hypertensive retinopathy    OU   Pneumonia    Sepsis [facility])    Past Surgical History:  Procedure Laterality Date   colonoscopy  2019   internal hemorrhoids and pancolonic diverticulosis. Surveillance due in 2024 due to history of adenomatous colon polyps and family history.   COLONOSCOPY N/A 06/23/2021   Hemorrhoids on perianal exam. 2 small 4 hemorrhoid tags, minimal internla hemorrhoids, scars appropriately located. Scattered medium-mouthed diverticula in entire colon.   EYE SURGERY     HEMORRHOID SURGERY N/A 12/06/2023   Procedure: 3 COLUMN HEMORRHOIDECTOMY;  Surgeon: Joyce Nixon, MD;  Location: [facility];  Service: General;  Laterality: N/A;   TONSILLECTOMY     age 69    TOTAL KNEE ARTHROPLASTY Left 01/16/2024   Procedure: ARTHROPLASTY, KNEE, TOTAL;  Surgeon: Claiborne Crew, MD;  Location: [facility];  Service: Orthopedics;  Laterality: Left;   Patient Active Problem List   Diagnosis Date Noted   S/P total knee arthroplasty, left 01/16/2024   Prolapsed internal hemorrhoids, grade 3 08/23/2022   Internal hemorrhoids 07/21/2022   Family  history of colon cancer 07/05/2021   Hiatal hernia 10/22/2020   Rectal bleeding 10/22/2020   Hospital discharge follow-up 01/10/2020   Sepsis secondary to UTI (HCC) 01/01/2020   GERD (gastroesophageal reflux disease)    Hyperlipidemia    Hypokalemia    Nodular prostate 08/07/2019   Benign prostatic hyperplasia with urinary frequency 08/07/2019   White coat syndrome with diagnosis of hypertension 08/07/2019   History of retinal tear 08/07/2019   Actinic keratoses 08/07/2019    PCP: Eliodoro Guerin, DO  REFERRING PROVIDER: Claiborne Crew, MD   REFERRING DIAG: Presence of left artificial knee joint   THERAPY DIAG:  Acute pain of left knee  Stiffness of left knee, not elsewhere classified  Localized edema  Rationale for Evaluation and Treatment: Rehabilitation  ONSET DATE: 01/16/24  SUBJECTIVE:   SUBJECTIVE STATEMENT: Pain about a 1-2/10.  PERTINENT HISTORY: Hypertension and arthritis PAIN:  Are you having pain? No  PRECAUTIONS: None  RED FLAGS: None   WEIGHT BEARING RESTRICTIONS: No  FALLS:  Has patient fallen in last 6 months? No  LIVING ENVIRONMENT: Lives with: lives with their spouse Lives in: House/apartment Stairs: Yes: External: 3-4 steps; on left going up Has following equipment at home: Tourist information centre manager - 2 wheeled  OCCUPATION: retired  PLOF: Independent  PATIENT  GOALS: improved mobility, be able to walk his dog and fish   NEXT MD VISIT: 01/31/24  OBJECTIVE:  Note: Objective measures were completed at Evaluation unless otherwise noted.  PATIENT SURVEYS:  LEFS  Extreme difficulty/unable (0), Quite a bit of difficulty (1), Moderate difficulty (2), Little difficulty (3), No difficulty (4) Survey date:  01/19/24  Any of your usual work, housework or school activities 1  2. Usual hobbies, recreational or sporting activities 0  3. Getting into/out of the bath 1  4. Walking between rooms 3  5. Putting on socks/shoes 2  6.  Squatting  0  7. Lifting an object, like a bag of groceries from the floor 0  8. Performing light activities around your home 3  9. Performing heavy activities around your home 0  10. Getting into/out of a car 1  11. Walking 2 blocks 0  12. Walking 1 mile 0  13. Going up/down 10 stairs (1 flight) 2  14. Standing for 1 hour 2  15.  sitting for 1 hour 3  16. Running on even ground 0  17. Running on uneven ground 0  18. Making sharp turns while running fast 0  19. Hopping  0  20. Rolling over in bed 2  Score total:  20/80     COGNITION: Overall cognitive status: Within functional limits for tasks assessed     SENSATION: Patient reports no numbness or tingling  EDEMA:  Circumferential: Left tibiofemoral joint line: 44.4 cm Right tibiofemoral joint line: 40.2 cm   PALPATION: TTP: left hip adductors   LOWER EXTREMITY ROM:  Active ROM Right eval Left eval  Hip flexion    Hip extension    Hip abduction    Hip adduction    Hip internal rotation    Hip external rotation    Knee flexion 144 92 PROM: 95  Knee extension 0 4  Ankle dorsiflexion    Ankle plantarflexion    Ankle inversion    Ankle eversion     02/09/24:  Active left knee flexion is 125 degrees.  LOWER EXTREMITY MMT: not tested due to surgical condition  GAIT: Assistive device utilized: Environmental consultant - 2 wheeled Level of assistance: Modified independence Comments: Step through pattern with reduced left knee flexion in swing phase, stride length, and gait speed                                                                                                                         TREATMENT DATE:   02/09/24:                                     EXERCISE LOG  Exercise Repetitions and Resistance Comments  Nustep Level 4 x 6 minutes   Recumbent bike Seat 6 x 9 minutes   Knee ext 10# x 3 minutes   Ham curls 30# x 3 minutes   Leg  Press 2 plates (seat 9) x 3 minutes   LE elevation x 15 minutes to patient's left  knee.    02/05/24    EXERCISE LOG   LT knee  Exercise Repetitions and Resistance Comments  Nustep  L4 x 15 minutes @ seat 10-8   Rocker board 4 minutes   Lunges onto step  14 box x 3 mins For knee flexion   Forward Step Ups and side  6 box  3 x 10 reps   LAQs 2# x 25 reps   Seated Marches    Seated Ham Curls Red x 25 reps    Static stance on foam   Narrow BOS  STS  X 5     Blank cell = exercise not performed today  Modalities: no redness or adverse reaction to today's modalities  Date:  Vaso: Knee, 34 degrees; low pressure, 15 mins, Pain and Edema  01/23/24:  EXERCISE LOG  Exercise Repetitions and Resistance Comments  Nustep Level 1 progressing to seat 9 over 15 minutes.   Rockerboard  4 minutes   Seated knee glide 4 minutes           In supine:  Low load long duration stretching x 4 minutes into left knee flexion f/b LE elevation and vasopneumatic on low x 20 minutes.     PATIENT EDUCATION:  Education details:  Person educated: Patient and Spouse Education method: Explanation Education comprehension: verbalized understanding  HOME EXERCISE PROGRAM:   ASSESSMENT:  CLINICAL IMPRESSION: Patient making excellent progress toward goals and has met LTG #2 with active flexion to 125 degrees today.    OBJECTIVE IMPAIRMENTS: Abnormal gait, decreased activity tolerance, decreased mobility, difficulty walking, decreased ROM, decreased strength, hypomobility, impaired tone, and pain.   ACTIVITY LIMITATIONS: carrying, squatting, transfers, and locomotion level  PARTICIPATION LIMITATIONS: cleaning, shopping, and community activity  PERSONAL FACTORS: 1-2 comorbidities: Hypertension and arthritis are also affecting patient's functional outcome.   REHAB POTENTIAL: Excellent  CLINICAL DECISION MAKING: Stable/uncomplicated  EVALUATION COMPLEXITY: Low   GOALS: Goals reviewed with patient? Yes  LONG TERM GOALS: Target date: 02/14/24  Patient will be independent with his  HEP. Baseline:  Goal status: Partially met  2.  Patient will be able to demonstrate at least 120 degrees of active left knee flexion for improved function navigating steps. Baseline:  Goal status: MET (02/09/24). 3.  Patient will be able to navigate at least 4 steps with a reciprocal pattern for improved household mobility. Baseline:  Goal status: On going  4.  Patient will be able to ambulate without an assistive device with minimal to no gait deviations for improved functional mobility. Baseline:  Goal status: partially met  5.  Patient will improve his LEFS score to at least 50/80 for improved perceived function with his daily activities. Baseline:  Goal status: On going  PLAN:  PT FREQUENCY: 2-3x/week  PT DURATION: 4 weeks  PLANNED INTERVENTIONS: 97164- PT Re-evaluation, 97750- Physical Performance Testing, 97110-Therapeutic exercises, 97530- Therapeutic activity, [id number]- Neuromuscular re-education, 97535- Self Care, [id number]- Manual therapy, [id number]- Gait training, [phone]- Electrical stimulation (unattended), 97016- Vasopneumatic device, Patient/Family education, Balance training, Stair training, Joint mobilization, Cryotherapy, and Moist heat  PLAN FOR NEXT SESSION: Recumbent bike.  Allisa Einspahr, [place], PT 02/09/2024, 11:07 AM

## 2024-02-12 ENCOUNTER — Encounter: Payer: Self-pay | Admitting: Family Medicine

## 2024-02-12 ENCOUNTER — Ambulatory Visit (INDEPENDENT_AMBULATORY_CARE_PROVIDER_SITE_OTHER): Payer: Medicare Other | Admitting: Family Medicine

## 2024-02-12 ENCOUNTER — Ambulatory Visit

## 2024-02-12 VITALS — BP 130/83 | HR 65 | Temp 97.9°F | Ht 72.0 in | Wt 187.0 lb

## 2024-02-12 DIAGNOSIS — R600 Localized edema: Secondary | ICD-10-CM | POA: Diagnosis not present

## 2024-02-12 DIAGNOSIS — R7303 Prediabetes: Secondary | ICD-10-CM | POA: Diagnosis not present

## 2024-02-12 DIAGNOSIS — Z96652 Presence of left artificial knee joint: Secondary | ICD-10-CM

## 2024-02-12 DIAGNOSIS — M25562 Pain in left knee: Secondary | ICD-10-CM

## 2024-02-12 DIAGNOSIS — M25662 Stiffness of left knee, not elsewhere classified: Secondary | ICD-10-CM | POA: Diagnosis not present

## 2024-02-12 LAB — BAYER DCA HB A1C WAIVED: HB A1C (BAYER DCA - WAIVED): 5.8 % — ABNORMAL HIGH (ref 4.8–5.6)

## 2024-02-12 NOTE — Therapy (Signed)
 OUTPATIENT PHYSICAL THERAPY LOWER EXTREMITY TREATMENT  Patient Name: Ralph Stevens MRN: [id number] DOB:20-Aug-1951, 73 y.o., male Today's Date: 02/12/2024  END OF SESSION:  PT End of Session - 02/12/24 0915     Visit Number 8    Number of Visits 12    Date for PT Re-Evaluation 02/16/24    PT Start Time 0914    PT Stop Time 0952    PT Time Calculation (min) 38 min    Activity Tolerance Patient tolerated treatment well    Behavior During Therapy WFL for tasks assessed/performed            Past Medical History:  Diagnosis Date   Acid reflux    Arthritis    Arthritis    Cataract    Mixed form OU   Enlarged prostate    PSA goes up and down    Family history of adverse reaction to anesthesia    sister has had trouble waking up   GERD (gastroesophageal reflux disease)    High cholesterol    Hyperlipidemia    Hypertension    Hypertensive retinopathy    OU   Pneumonia    Sepsis [facility])    Past Surgical History:  Procedure Laterality Date   colonoscopy  2019   internal hemorrhoids and pancolonic diverticulosis. Surveillance due in 2024 due to history of adenomatous colon polyps and family history.   COLONOSCOPY N/A 06/23/2021   Hemorrhoids on perianal exam. 2 small 4 hemorrhoid tags, minimal internla hemorrhoids, scars appropriately located. Scattered medium-mouthed diverticula in entire colon.   EYE SURGERY     HEMORRHOID SURGERY N/A 12/06/2023   Procedure: 3 COLUMN HEMORRHOIDECTOMY;  Surgeon: Debby Hila, MD;  Location: [facility];  Service: General;  Laterality: N/A;   TONSILLECTOMY     age 63    TOTAL KNEE ARTHROPLASTY Left 01/16/2024   Procedure: ARTHROPLASTY, KNEE, TOTAL;  Surgeon: Ernie Cough, MD;  Location: [facility];  Service: Orthopedics;  Laterality: Left;   Patient Active Problem List   Diagnosis Date Noted   S/P total knee arthroplasty, left 01/16/2024   Prolapsed internal hemorrhoids, grade 3 08/23/2022   Family history of colon cancer 07/05/2021    Hiatal hernia 10/22/2020   Rectal bleeding 10/22/2020   GERD (gastroesophageal reflux disease)    Hyperlipidemia    Nodular prostate 08/07/2019   Benign prostatic hyperplasia with urinary frequency 08/07/2019   White coat syndrome with diagnosis of hypertension 08/07/2019   History of retinal tear 08/07/2019   Actinic keratoses 08/07/2019    PCP: Jolinda Norene HERO, DO  REFERRING PROVIDER: Ernie Cough, MD   REFERRING DIAG: Presence of left artificial knee joint   THERAPY DIAG:  Acute pain of left knee  Stiffness of left knee, not elsewhere classified  Localized edema  Rationale for Evaluation and Treatment: Rehabilitation  ONSET DATE: 01/16/24  SUBJECTIVE:   SUBJECTIVE STATEMENT: Patient reports that his knee is a little sore today. However, he has not had any problems since his last appointment.   PERTINENT HISTORY: Hypertension and arthritis PAIN:  Are you having pain? Yes: NPRS scale: 1-2/10 Pain location: left knee  PRECAUTIONS: None  RED FLAGS: None   WEIGHT BEARING RESTRICTIONS: No  FALLS:  Has patient fallen in last 6 months? No  LIVING ENVIRONMENT: Lives with: lives with their spouse Lives in: House/apartment Stairs: Yes: External: 3-4 steps; on left going up Has following equipment at home: Tourist information centre manager - 2 wheeled  OCCUPATION: retired  PLOF: Independent  PATIENT GOALS: improved mobility, be able to walk his dog and fish   NEXT MD VISIT: 01/31/24  OBJECTIVE:  Note: Objective measures were completed at Evaluation unless otherwise noted.  PATIENT SURVEYS:  LEFS  Extreme difficulty/unable (0), Quite a bit of difficulty (1), Moderate difficulty (2), Little difficulty (3), No difficulty (4) Survey date:  01/19/24  Any of your usual work, housework or school activities 1  2. Usual hobbies, recreational or sporting activities 0  3. Getting into/out of the bath 1  4. Walking between rooms 3  5. Putting on socks/shoes 2  6.  Squatting  0  7. Lifting an object, like a bag of groceries from the floor 0  8. Performing light activities around your home 3  9. Performing heavy activities around your home 0  10. Getting into/out of a car 1  11. Walking 2 blocks 0  12. Walking 1 mile 0  13. Going up/down 10 stairs (1 flight) 2  14. Standing for 1 hour 2  15.  sitting for 1 hour 3  16. Running on even ground 0  17. Running on uneven ground 0  18. Making sharp turns while running fast 0  19. Hopping  0  20. Rolling over in bed 2  Score total:  20/80     COGNITION: Overall cognitive status: Within functional limits for tasks assessed     SENSATION: Patient reports no numbness or tingling  EDEMA:  Circumferential: Left tibiofemoral joint line: 44.4 cm Right tibiofemoral joint line: 40.2 cm   PALPATION: TTP: left hip adductors   LOWER EXTREMITY ROM:  Active ROM Right eval Left eval  Hip flexion    Hip extension    Hip abduction    Hip adduction    Hip internal rotation    Hip external rotation    Knee flexion 144 92 PROM: 95  Knee extension 0 4  Ankle dorsiflexion    Ankle plantarflexion    Ankle inversion    Ankle eversion     02/09/24:  Active left knee flexion is 125 degrees.  LOWER EXTREMITY MMT: not tested due to surgical condition  GAIT: Assistive device utilized: Environmental consultant - 2 wheeled Level of assistance: Modified independence Comments: Step through pattern with reduced left knee flexion in swing phase, stride length, and gait speed                                                                                                                         TREATMENT DATE:                                    02/12/24 EXERCISE LOG  Exercise Repetitions and Resistance Comments  Recumbent bike  L3 x 16 minutes @ seat 8   Cybex knee extension 30# x 2.5 minutes   Cybex knee flexion  50# x 2.5 minutes   Leg press  2 plates @  seat 7 x 3.5 minutes        Blank cell = exercise not performed today    02/09/24:  EXERCISE LOG  Exercise Repetitions and Resistance Comments  Nustep Level 4 x 6 minutes   Recumbent bike Seat 6 x 9 minutes   Knee ext 10# x 3 minutes   Ham curls 30# x 3 minutes   Leg Press 2 plates (seat 9) x 3 minutes   LE elevation x 15 minutes to patient's left knee.    02/05/24    EXERCISE LOG   LT knee  Exercise Repetitions and Resistance Comments  Nustep  L4 x 15 minutes @ seat 10-8   Rocker board 4 minutes   Lunges onto step  14 box x 3 mins For knee flexion   Forward Step Ups and side  6 box  3 x 10 reps   LAQs 2# x 25 reps   Seated Marches    Seated Ham Curls Red x 25 reps    Static stance on foam   Narrow BOS  STS  X 5     Blank cell = exercise not performed today  Modalities: no redness or adverse reaction to today's modalities  Date:  Vaso: Knee, 34 degrees; low pressure, 15 mins, Pain and Edema  PATIENT EDUCATION:  Education details: HEP, progress with physical therapy, healing, and benefits of exercise Person educated: Patient Education method: Explanation Education comprehension: verbalized understanding  HOME EXERCISE PROGRAM:   ASSESSMENT:  CLINICAL IMPRESSION: Patient was progressed with multiple familiar interventions for improved muscular strength and power. He was educated on healing following his surgery, his progress with physical therapy, and the benefits of exercise. He reported understanding. He experienced no increase in pain or discomfort with any of today's interventions. Recommend that he continue with skilled physical therapy to address his remaining impairments to return to his prior level of function.    OBJECTIVE IMPAIRMENTS: Abnormal gait, decreased activity tolerance, decreased mobility, difficulty walking, decreased ROM, decreased strength, hypomobility, impaired tone, and pain.   ACTIVITY LIMITATIONS: carrying, squatting, transfers, and locomotion level  PARTICIPATION LIMITATIONS: cleaning, shopping, and community  activity  PERSONAL FACTORS: 1-2 comorbidities: Hypertension and arthritis are also affecting patient's functional outcome.   REHAB POTENTIAL: Excellent  CLINICAL DECISION MAKING: Stable/uncomplicated  EVALUATION COMPLEXITY: Low   GOALS: Goals reviewed with patient? Yes  LONG TERM GOALS: Target date: 02/14/24  Patient will be independent with his HEP. Baseline:  Goal status: MET  2.  Patient will be able to demonstrate at least 120 degrees of active left knee flexion for improved function navigating steps. Baseline:  Goal status: MET (02/09/24). 3.  Patient will be able to navigate at least 4 steps with a reciprocal pattern for improved household mobility. Baseline:  Goal status: MET  4.  Patient will be able to ambulate without an assistive device with minimal to no gait deviations for improved functional mobility. Baseline:  Goal status: MET  5.  Patient will improve his LEFS score to at least 50/80 for improved perceived function with his daily activities. Baseline:  Goal status: On going  PLAN:  PT FREQUENCY: 2-3x/week  PT DURATION: 4 weeks  PLANNED INTERVENTIONS: 97164- PT Re-evaluation, 97750- Physical Performance Testing, 97110-Therapeutic exercises, 97530- Therapeutic activity, [id number]- Neuromuscular re-education, 97535- Self Care, [id number]- Manual therapy, [id number]- Gait training, [phone]- Electrical stimulation (unattended), 97016- Vasopneumatic device, Patient/Family education, Balance training, Stair training, Joint mobilization, Cryotherapy, and Moist heat  PLAN FOR NEXT SESSION: Recumbent bike.  Lacinda BROCKS  Judiann Celia, PT 02/12/2024, 10:04 AM

## 2024-02-12 NOTE — Progress Notes (Signed)
 Subjective: CC: Follow-up prediabetes, knee replacement PCP: Jolinda Norene HERO, DO YEP:Ralph Stevens is a 73 y.o. male presenting to clinic today for:  1.  Prediabetes He reports that he has been eliminating unnecessary sugars and replacing sugar with Truvia sugar substitute.  He is eating low-carb and hydrating well.  Now that he has had his knee done he is able to be more physically active and has been doing really well and is ahead of schedule with physical therapy.  He has 5 more sessions.  He still uses the Celebrex  but is wondering if he will be coming off of that soon.  Will be contacting his orthopedist.  Utilizing methocarbamol  at nighttime but is totally off of the tramadol .   ROS: Per HPI  Allergies  Allergen Reactions   Gabapentin  Hives   Past Medical History:  Diagnosis Date   Acid reflux    Arthritis    Arthritis    Cataract    Mixed form OU   Enlarged prostate    PSA goes up and down    Family history of adverse reaction to anesthesia    sister has had trouble waking up   GERD (gastroesophageal reflux disease)    High cholesterol    Hyperlipidemia    Hypertension    Hypertensive retinopathy    OU   Pneumonia    Sepsis (HCC)     Current Outpatient Medications:    acetaminophen  (TYLENOL ) 500 MG tablet, Take 500 mg by mouth daily as needed for moderate pain (pain score 4-6)., Disp: , Rfl:    alfuzosin  (UROXATRAL ) 10 MG 24 hr tablet, Take 1 tablet (10 mg total) by mouth every evening., Disp: 90 tablet, Rfl: 3   amLODipine  (NORVASC ) 5 MG tablet, Take 1 tablet (5 mg total) by mouth daily., Disp: 90 tablet, Rfl: 3   aspirin  (ASPIRIN  CHILDRENS) 81 MG chewable tablet, Chew 1 tablet (81 mg total) by mouth in the morning and at bedtime for 28 days., Disp: 56 tablet, Rfl: 0   celecoxib  (CELEBREX ) 200 MG capsule, Take 1 capsule (200 mg total) by mouth 2 (two) times daily., Disp: 60 capsule, Rfl: 0   chlorhexidine  (HIBICLENS ) 4 % external liquid, Apply 15 mLs  (1 Application total) topically as directed for 30 doses. Use as directed daily for 5 days every other week for 6 weeks., Disp: 946 mL, Rfl: 1   escitalopram  (LEXAPRO ) 10 MG tablet, Take 1 tablet (10 mg total) by mouth daily., Disp: 90 tablet, Rfl: 3   esomeprazole (NEXIUM) 20 MG capsule, Take 20 mg by mouth daily., Disp: , Rfl:    fluticasone  (FLONASE ) 50 MCG/ACT nasal spray, Place 1-2 sprays into both nostrils daily as needed for allergies or rhinitis., Disp: 48 mL, Rfl: 3   lisinopril  (ZESTRIL ) 40 MG tablet, Take 1 tablet (40 mg total) by mouth daily., Disp: 90 tablet, Rfl: 3   methocarbamol  (ROBAXIN ) 500 MG tablet, Take 1 tablet (500 mg total) by mouth every 6 (six) hours as needed for muscle spasms., Disp: 40 tablet, Rfl: 1   metoprolol  tartrate (LOPRESSOR ) 50 MG tablet, Take 1 tablet (50 mg total) by mouth 2 (two) times daily., Disp: 180 tablet, Rfl: 3   mupirocin  ointment (BACTROBAN ) 2 %, Place 1 Application into the nose 2 (two) times daily for 60 doses. Use as directed 2 times daily for 5 days every other week for 6 weeks., Disp: 60 g, Rfl: 0   rosuvastatin  (CRESTOR ) 20 MG tablet, Take 1 tablet (20 mg  total) by mouth daily. (Patient taking differently: Take 20 mg by mouth at bedtime.), Disp: 90 tablet, Rfl: 3   traMADol  (ULTRAM ) 50 MG tablet, Take 1-2 tablets (50-100 mg total) by mouth every 6 (six) hours as needed for severe pain (pain score 7-10)., Disp: 42 tablet, Rfl: 0 Social History   Socioeconomic History   Marital status: Married    Spouse name: Dickey   Number of children: 3   Years of education: Not on file   Highest education level: 12th grade  Occupational History    Comment: semi-retired   Tobacco Use   Smoking status: Never   Smokeless tobacco: Never  Vaping Use   Vaping status: Never Used  Substance and Sexual Activity   Alcohol use: Never   Drug use: Never   Sexual activity: Not Currently  Other Topics Concern   Not on file  Social History Narrative   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     He recently relocated from [place]  and resides locally with his wife.     He has a son that lives in [place] but his other 2 daughters are residing in [place] .   Social Drivers of Health   Financial Resource Strain: Medium Risk (02/05/2024)   Overall Financial Resource Strain (CARDIA)    Difficulty of Paying Living Expenses: Somewhat hard  Food Insecurity: No Food Insecurity (02/05/2024)   Hunger Vital Sign    Worried About Running Out of Food in the Last Year: Never true    Ran Out of Food in the Last Year: Never true  Transportation Needs: No Transportation Needs (02/05/2024)   PRAPARE - Administrator, Civil Service (Medical): No    Lack of Transportation (Non-Medical): No  Physical Activity: Insufficiently Active (02/05/2024)   Exercise Vital Sign    Days of Exercise per Week: 7 days    Minutes of Exercise per Session: 20 min  Stress: No Stress Concern Present (02/05/2024)   [employer] of Occupational Health - Occupational Stress Questionnaire    Feeling of Stress: Only a little  Recent Concern: Stress - Stress Concern Present (11/15/2023)   [employer] of Occupational Health - Occupational Stress Questionnaire    Feeling of Stress : To some extent  Social Connections: Moderately Isolated (02/05/2024)   Social Connection and Isolation Panel    Frequency of Communication with Friends and Family: More than three times a week    Frequency of Social Gatherings with Friends and Family: More than three times a week    Attends Religious Services: Never    Database administrator or Organizations: No    Attends Engineer, structural: Not on file    Marital Status: Married  Catering manager Violence: Not At Risk (09/26/2023)   Humiliation, Afraid, Rape, and Kick questionnaire    Fear of Current or Ex-Partner: No    Emotionally Abused: No    Physically Abused: No    Sexually  Abused: No   Family History  Problem Relation Age of Onset   Cancer Mother    Colon cancer Mother    Dementia Mother    Alzheimer's disease Mother    Emphysema Father    Hypothyroidism Sister    Thyroid  disease Sister    Hypertension Sister    Multiple sclerosis Daughter    Heart defect Daughter    Colon cancer Maternal Grandmother    Heart disease Maternal Grandfather  Heart disease Paternal Grandmother    Cancer Paternal Grandfather        unknown ( mouth/throat)   Colon cancer Maternal Uncle     Objective: Office vital signs reviewed. BP 130/83   Pulse 65   Temp 97.9 F (36.6 C)   Ht 6' (1.829 m)   Wt 187 lb (84.8 kg)   SpO2 99%   BMI 25.36 kg/m   Physical Examination:  General: Awake, alert, well nourished, No acute distress HEENT: Sclera white.  Moist mucous membranes Cardio: regular rate and rhythm, S1S2 heard, no murmurs appreciated Pulm: clear to auscultation bilaterally, no wheezes, rhonchi or rales; normal work of breathing on room air MSK: Has postop changes to the left knee with a well-appearing postsurgical site.  No evidence of secondary infection or complication including dehiscence  Lab Results  Component Value Date   HGBA1C 6.0 (H) 08/14/2023    Assessment/ Plan: 73 y.o. male   Prediabetes - Plan: Bayer DCA Hb A1c Waived  History of total left knee replacement  A1c down to 5.8 from 6.0.  He is doing well postop.  Encouraged continued low-carb diet with increased physical activity to reduce blood sugar.  Will plan to repeat these levels in December with his fasting labs.   Norene CHRISTELLA Fielding, DO [facility] [phone]

## 2024-02-12 NOTE — Patient Instructions (Signed)
 A1c technically still in PREdiabetes but it is DOWN from 6.0 to 5.8 today.  Keep up the good work.  Hopefully, over time we will continue to see it normalize.  Prediabetes: What to Know Prediabetes is when your blood sugar, also called glucose, is at a higher level than normal but not high enough for you to be diagnosed with type 2 diabetes (type 2 diabetes mellitus). Having prediabetes puts you at risk for getting type 2 diabetes. By making some healthy changes, you may be able to prevent or delay getting type 2 diabetes. This is important because type 2 diabetes can lead to serious problems. Some of these include: Heart disease. Stroke. Blindness. Kidney disease. Depression. Poor blood flow in the feet and legs. In very bad cases, this could lead to having a leg removed by surgery (amputation). What are the causes? The exact cause of prediabetes isn't known. It may result from insulin resistance. Insulin resistance happens when cells in the body don't respond properly to insulin that the body makes. This can cause too much sugar to build up in the blood. High blood sugar, also called hyperglycemia, can develop. What increases the risk? Having a family member with type 2 diabetes. Being older than 71 years of age. Having had a temporary form of diabetes during a pregnancy. This is called gestational diabetes. Having had polycystic ovary syndrome (PCOS). Being overweight or obese. Being inactive and not getting much exercise. Having a history of heart disease. This may include problems with cholesterol levels, high levels of blood fats, or high blood pressure. What are the signs or symptoms? You may have no symptoms. If you do have symptoms, they may include: Increased hunger. Increased thirst. Needing to pee more often. Changes in how you see, like blurry vision. Feeling tired. How is this diagnosed? Prediabetes can be diagnosed with blood tests that check your blood sugar. One or more  of these tests may be done: A fasting blood glucose (FBG) test. You won't be allowed to eat (you will fast) for at least 8 hours before a blood sample is taken. An A1C blood test, also called a hemoglobin A1C test. This test shows information about blood sugar levels over the past 2?3 months. An oral glucose tolerance test (OGTT). This test measures your blood sugar at two points in time: After you haven't eaten for a while. This is your baseline level. Two hours after you drink a beverage that has sugar in it. You may be diagnosed with prediabetes if: Your FBG is 100?125 mg/dL (4.3-3.0 mmol/L). Your A1C level is 5.7?6.4% (39-46 mmol/mol). Your OGTT result is 140?199 mg/dL (2.1-88 mmol/L). These blood tests may need to be done again to be sure of the diagnosis. How is this treated? Treatment may include making changes to your diet and lifestyle. These changes can help lower your blood sugar and keep you from getting type 2 diabetes. In some cases, medicine may be given to help lower your risk. Follow these instructions at home: Eating and drinking  Eat and drink as told. Follow a healthy meal plan. This includes eating lean proteins, whole grains, legumes, fresh fruits and vegetables, low-fat dairy products, and healthy fats. Meet with an expert in healthy eating called a dietitian. This person can help create a healthy eating plan that's right for you. Lifestyle Do moderate-intensity exercise. Do this for at least 30 minutes a day on 5 or more days each week, or as told by your health care provider. A mix  of activities may be best. Good choices include brisk walking, swimming, biking, and weight lifting. Try to lose weight if your provider says it's OK. Losing 5-7% of your body weight can help reverse insulin resistance. Do not drink alcohol if: Your provider tells you not to drink. You're pregnant, may be pregnant, or plan to become pregnant. If you drink alcohol: Limit how much you  have to: 0-1 drink a day if you're male. 0-2 drinks a day if you're male. Know how much alcohol is in your drink. In the [place] one drink is one 12 oz bottle of beer (355 mL), one 5 oz glass of wine (148 mL), or one 1 oz glass of hard liquor (44 mL). General instructions Take medicines only as told. You may be given medicines that help lower the risk of type 2 diabetes. Do not smoke, vape, or use nicotine or tobacco. Where to find more information American Diabetes Association: diabetes.org/about-diabetes/prediabetes Academy of Nutrition and Dietetics: eatright.org American Heart Association: Go to ThisJobs.cz. Click the search icon. Type prediabetes in the search box. Contact a health care provider if: You have any of these symptoms: Increased hunger. Peeing more often than usual. Increased thirst. Feeling tired. Changes in how you see, like blurry vision. Feeling like you may throw up. Throwing up. Get help right away if: You have shortness of breath. You feel confused. This information is not intended to replace advice given to you by your health care provider. Make sure you discuss any questions you have with your health care provider. Document Revised: 03/12/2023 Document Reviewed: 03/12/2023 Elsevier Patient Education  2024 [employer].

## 2024-02-13 ENCOUNTER — Ambulatory Visit: Payer: Self-pay | Admitting: Family Medicine

## 2024-02-16 ENCOUNTER — Encounter

## 2024-02-20 ENCOUNTER — Encounter: Payer: Self-pay | Admitting: *Deleted

## 2024-02-20 ENCOUNTER — Ambulatory Visit: Attending: Orthopedic Surgery | Admitting: *Deleted

## 2024-02-20 DIAGNOSIS — R600 Localized edema: Secondary | ICD-10-CM | POA: Insufficient documentation

## 2024-02-20 DIAGNOSIS — M25562 Pain in left knee: Secondary | ICD-10-CM | POA: Diagnosis not present

## 2024-02-20 DIAGNOSIS — M25662 Stiffness of left knee, not elsewhere classified: Secondary | ICD-10-CM | POA: Diagnosis not present

## 2024-02-20 NOTE — Therapy (Addendum)
 OUTPATIENT PHYSICAL THERAPY LOWER EXTREMITY TREATMENT  Patient Name: Ralph Stevens MRN: [id number] DOB:[birth year], 73 y.o., male Today's Date: 02/20/2024  END OF SESSION:  PT End of Session - 02/20/24 1016     Visit Number 9    Number of Visits 12    Date for PT Re-Evaluation 02/16/24    PT Start Time 1015    PT Stop Time 1050    PT Time Calculation (min) 35 min            Past Medical History:  Diagnosis Date   Acid reflux    Arthritis    Arthritis    Cataract    Mixed form OU   Enlarged prostate    PSA goes up and down    Family history of adverse reaction to anesthesia    sister has had trouble waking up   GERD (gastroesophageal reflux disease)    High cholesterol    Hyperlipidemia    Hypertension    Hypertensive retinopathy    OU   Pneumonia    Sepsis [facility])    Past Surgical History:  Procedure Laterality Date   colonoscopy  2019   internal hemorrhoids and pancolonic diverticulosis. Surveillance due in 2024 due to history of adenomatous colon polyps and family history.   COLONOSCOPY N/A 06/23/2021   Hemorrhoids on perianal exam. 2 small 4 hemorrhoid tags, minimal internla hemorrhoids, scars appropriately located. Scattered medium-mouthed diverticula in entire colon.   EYE SURGERY     HEMORRHOID SURGERY N/A 12/06/2023   Procedure: 3 COLUMN HEMORRHOIDECTOMY;  Surgeon: Ralph Hila, MD;  Location: [facility];  Service: General;  Laterality: N/A;   TONSILLECTOMY     age 40    TOTAL KNEE ARTHROPLASTY Left 01/16/2024   Procedure: ARTHROPLASTY, KNEE, TOTAL;  Surgeon: Ralph Cough, MD;  Location: [facility];  Service: Orthopedics;  Laterality: Left;   Patient Active Problem List   Diagnosis Date Noted   S/P total knee arthroplasty, left 01/16/2024   Prolapsed internal hemorrhoids, grade 3 08/23/2022   Family history of colon cancer 07/05/2021   Hiatal hernia 10/22/2020   Rectal bleeding 10/22/2020   GERD (gastroesophageal reflux disease)    Hyperlipidemia     Nodular prostate 08/07/2019   Benign prostatic hyperplasia with urinary frequency 08/07/2019   White coat syndrome with diagnosis of hypertension 08/07/2019   History of retinal tear 08/07/2019   Actinic keratoses 08/07/2019    PCP: Ralph Norene HERO, DO  REFERRING PROVIDER: Ernie Cough, MD   REFERRING DIAG: Presence of left artificial knee joint   THERAPY DIAG:  Acute pain of left knee  Stiffness of left knee, not elsewhere classified  Localized edema  Rationale for Evaluation and Treatment: Rehabilitation  ONSET DATE: 01/16/24  SUBJECTIVE:   SUBJECTIVE STATEMENT: DC today.   PERTINENT HISTORY: Hypertension and arthritis PAIN:  Are you having pain? Yes: NPRS scale: 1-2/10 Pain location: left knee  PRECAUTIONS: None  RED FLAGS: None   WEIGHT BEARING RESTRICTIONS: No  FALLS:  Has patient fallen in last 6 months? No  LIVING ENVIRONMENT: Lives with: lives with their spouse Lives in: House/apartment Stairs: Yes: External: 3-4 steps; on left going up Has following equipment at home: Tourist information centre manager - 2 wheeled  OCCUPATION: retired  PLOF: Independent  PATIENT GOALS: improved mobility, be able to walk his dog and fish   NEXT MD VISIT: 01/31/24  OBJECTIVE:  Note: Objective measures were completed at Evaluation unless otherwise noted.  PATIENT SURVEYS:  LEFS  Extreme difficulty/unable (0), Quite a bit of difficulty (1), Moderate difficulty (2), Little difficulty (3), No difficulty (4) Survey date:  01/19/24  Any of your usual work, housework or school activities 1  2. Usual hobbies, recreational or sporting activities 0  3. Getting into/out of the bath 1  4. Walking between rooms 3  5. Putting on socks/shoes 2  6. Squatting  0  7. Lifting an object, like a bag of groceries from the floor 0  8. Performing light activities around your home 3  9. Performing heavy activities around your home 0  10. Getting into/out of a car 1  11.  Walking 2 blocks 0  12. Walking 1 mile 0  13. Going up/down 10 stairs (1 flight) 2  14. Standing for 1 hour 2  15.  sitting for 1 hour 3  16. Running on even ground 0  17. Running on uneven ground 0  18. Making sharp turns while running fast 0  19. Hopping  0  20. Rolling over in bed 2  Score total:  20/80     COGNITION: Overall cognitive status: Within functional limits for tasks assessed     SENSATION: Patient reports no numbness or tingling  EDEMA:  Circumferential: Left tibiofemoral joint line: 44.4 cm Right tibiofemoral joint line: 40.2 cm   PALPATION: TTP: left hip adductors   LOWER EXTREMITY ROM:  Active ROM Right eval Left eval  Hip flexion    Hip extension    Hip abduction    Hip adduction    Hip internal rotation    Hip external rotation    Knee flexion 144 92 PROM: 95  Knee extension 0 4  Ankle dorsiflexion    Ankle plantarflexion    Ankle inversion    Ankle eversion     02/09/24:  Active left knee flexion is 125 degrees.  LOWER EXTREMITY MMT: not tested due to surgical condition  GAIT: Assistive device utilized: Environmental consultant - 2 wheeled Level of assistance: Modified independence Comments: Step through pattern with reduced left knee flexion in swing phase, stride length, and gait speed                                                                                                                         TREATMENT DATE: 02-20-24 Bike x 10 mins SLS x 5  x 30 sec holds 8in box step ups x 20 8in box flexion lunge  SIt to stand x 10 without UE assist Reviewed HEP                                   02/12/24 EXERCISE LOG  LT knee  Exercise Repetitions and Resistance Comments  Recumbent bike  L3 x 15 minutes @ seat 8   Cybex knee extension 30# x 2.5 minutes   Cybex knee flexion  50# x 2.5 minutes   Leg press  2 plates @  seat 7 x 3.5 minutes        Blank cell = exercise not performed today   02/09/24:  EXERCISE LOG  Exercise Repetitions and Resistance  Comments  Nustep Level 4 x 6 minutes   Recumbent bike Seat 6 x 9 minutes   Knee ext 10# x 3 minutes   Ham curls 30# x 3 minutes   Leg Press 2 plates (seat 9) x 3 minutes   LE elevation x 15 minutes to patient's left knee.    02/05/24    EXERCISE LOG   LT knee  Exercise Repetitions and Resistance Comments  Nustep  L4 x 15 minutes @ seat 10-8   Rocker board 4 minutes   Lunges onto step  14 box x 3 mins For knee flexion   Forward Step Ups and side  6 box  3 x 10 reps   LAQs 2# x 25 reps   Seated Marches    Seated Ham Curls Red x 25 reps    Static stance on foam   Narrow BOS  STS  X 5     Blank cell = exercise not performed today  Modalities: no redness or adverse reaction to today's modalities  Date:  Vaso: Knee, 34 degrees; low pressure, 15 mins, Pain and Edema  PATIENT EDUCATION:  Education details: HEP, progress with physical therapy, healing, and benefits of exercise Person educated: Patient Education method: Explanation Education comprehension: verbalized understanding  HOME EXERCISE PROGRAM:   ASSESSMENT:  CLINICAL IMPRESSION: Patient  arrived today to finalize his HEP and DC from PT. Rx focused on review of HEP and add progressions. He has met all LTG's and  will be DC at this time to HEP and possible gym membership at local gym  PHYSICAL THERAPY DISCHARGE SUMMARY  Visits from Start of Care: 9  Current functional level related to goals / functional outcomes: Patient was able to meet all of his goals for skilled physical therapy.    Remaining deficits: None    Education / Equipment: HEP    Patient agrees to discharge. Patient goals were met. Patient is being discharged due to meeting the stated rehab goals.  Ralph Stevens, PT, DPT    OBJECTIVE IMPAIRMENTS: Abnormal gait, decreased activity tolerance, decreased mobility, difficulty walking, decreased ROM, decreased strength, hypomobility, impaired tone, and pain.   ACTIVITY LIMITATIONS: carrying,  squatting, transfers, and locomotion level  PARTICIPATION LIMITATIONS: cleaning, shopping, and community activity  PERSONAL FACTORS: 1-2 comorbidities: Hypertension and arthritis are also affecting patient's functional outcome.   REHAB POTENTIAL: Excellent  CLINICAL DECISION MAKING: Stable/uncomplicated  EVALUATION COMPLEXITY: Low   GOALS: Goals reviewed with patient? Yes  LONG TERM GOALS: Target date: 02/14/24  Patient will be independent with his HEP. Baseline:  Goal status: MET  2.  Patient will be able to demonstrate at least 120 degrees of active left knee flexion for improved function navigating steps. Baseline:  Goal status: MET (02/09/24). 3.  Patient will be able to navigate at least 4 steps with a reciprocal pattern for improved household mobility. Baseline:  Goal status: MET  4.  Patient will be able to ambulate without an assistive device with minimal to no gait deviations for improved functional mobility. Baseline:  Goal status: MET  5.  Patient will improve his LEFS score to at least 50/80 for improved perceived function with his daily activities. Baseline:  Goal status: MET   75/80  PLAN:  PT FREQUENCY: 2-3x/week  PT DURATION: 4 weeks  PLANNED INTERVENTIONS: 02835-  PT Re-evaluation, 97750- Physical Performance Testing, 97110-Therapeutic exercises, 97530- Therapeutic activity, [id number]- Neuromuscular re-education, [phone]- Self Care, 02859- Manual therapy, [phone]- Gait training, [phone]- Electrical stimulation (unattended), [id number]- Vasopneumatic device, Patient/Family education, Balance training, Stair training, Joint mobilization, Cryotherapy, and Moist heat  PLAN  DC to HEP  Ralph Stevens,CHRIS, PTA 02/20/2024, 10:56 AM

## 2024-03-07 DIAGNOSIS — Z471 Aftercare following joint replacement surgery: Secondary | ICD-10-CM | POA: Diagnosis not present

## 2024-03-07 DIAGNOSIS — Z96652 Presence of left artificial knee joint: Secondary | ICD-10-CM | POA: Diagnosis not present

## 2024-03-22 DIAGNOSIS — H04123 Dry eye syndrome of bilateral lacrimal glands: Secondary | ICD-10-CM | POA: Diagnosis not present

## 2024-03-22 DIAGNOSIS — H40033 Anatomical narrow angle, bilateral: Secondary | ICD-10-CM | POA: Diagnosis not present

## 2024-06-10 ENCOUNTER — Other Ambulatory Visit: Payer: Self-pay | Admitting: Family Medicine

## 2024-06-10 DIAGNOSIS — I10 Essential (primary) hypertension: Secondary | ICD-10-CM

## 2024-06-24 ENCOUNTER — Encounter: Payer: Self-pay | Admitting: Radiology

## 2024-07-29 NOTE — Progress Notes (Unsigned)
 Impression/Assessment:  -History of elevated PSA, no prior biopsy, fairly stable PSA trend with normal exam today  -BPH with symptoms, stable daytime voiding pattern.  He does have nocturia which I think is more likely related to nocturnal polyuria  Plan:  -I discussed treatment of nocturia with him-limiting afternoon and evening fluids, limiting his already limited sodium diet, compression socks  -PSA is checked today  -Continue alfuzosin   -I will see back in 1 year  History of Present Illness:   12.3.2024:Ralph Stevens is here for continued f/u of BPH as well as h/o elevated PSA (he has not needed TRUS/Bx). PSA last year was 3.0.  He is on alfuzosin  for BPH.  Daytime urinary pattern has been unchanged over the past year but he is waking up 2-3 times a night to urinate.  He does state that he has lower extremity edema.  He limits his sodium intake. PSA 3.3  12.9.2025: Past Medical History:  Diagnosis Date   Acid reflux    Arthritis    Arthritis    Cataract    Mixed form OU   Enlarged prostate    PSA goes up and down    Family history of adverse reaction to anesthesia    sister has had trouble waking up   GERD (gastroesophageal reflux disease)    High cholesterol    Hyperlipidemia    Hypertension    Hypertensive retinopathy    OU   Pneumonia    Sepsis [facility])     Past Surgical History:  Procedure Laterality Date   colonoscopy  2019   internal hemorrhoids and pancolonic diverticulosis. Surveillance due in 2024 due to history of adenomatous colon polyps and family history.   COLONOSCOPY N/A 06/23/2021   Hemorrhoids on perianal exam. 2 small 4 hemorrhoid tags, minimal internla hemorrhoids, scars appropriately located. Scattered medium-mouthed diverticula in entire colon.   EYE SURGERY     HEMORRHOID SURGERY N/A 12/06/2023   Procedure: 3 COLUMN HEMORRHOIDECTOMY;  Surgeon: Debby Hila, MD;  Location: [facility];  Service: General;  Laterality: N/A;   TONSILLECTOMY     age 73     TOTAL KNEE ARTHROPLASTY Left 01/16/2024   Procedure: ARTHROPLASTY, KNEE, TOTAL;  Surgeon: Ernie Cough, MD;  Location: [facility];  Service: Orthopedics;  Laterality: Left;    Home Medications:  Allergies as of 07/30/2024       Reactions   Gabapentin  Hives        Medication List        Accurate as of July 29, 2024  4:43 PM. If you have any questions, ask your nurse or doctor.          acetaminophen  500 MG tablet Commonly known as: TYLENOL  Take 500 mg by mouth daily as needed for moderate pain (pain score 4-6).   alfuzosin  10 MG 24 hr tablet Commonly known as: UROXATRAL  Take 1 tablet (10 mg total) by mouth every evening.   amLODipine  5 MG tablet Commonly known as: NORVASC  Take 1 tablet (5 mg total) by mouth daily.   chlorhexidine  4 % external liquid Commonly known as: HIBICLENS  Apply 15 mLs (1 Application total) topically as directed for 30 doses. Use as directed daily for 5 days every other week for 6 weeks.   escitalopram  10 MG tablet Commonly known as: LEXAPRO  Take 1 tablet (10 mg total) by mouth daily.   esomeprazole 20 MG capsule Commonly known as: NEXIUM Take 20 mg by mouth daily.   fluticasone  50 MCG/ACT nasal spray Commonly known  as: FLONASE  Place 1-2 sprays into both nostrils daily as needed for allergies or rhinitis.   lisinopril  40 MG tablet Commonly known as: ZESTRIL  Take 1 tablet (40 mg total) by mouth daily.   methocarbamol  500 MG tablet Commonly known as: ROBAXIN  Take 1 tablet (500 mg total) by mouth every 6 (six) hours as needed for muscle spasms.   metoprolol  tartrate 50 MG tablet Commonly known as: LOPRESSOR  TAKE 1 TABLET BY MOUTH TWICE A DAY   rosuvastatin  20 MG tablet Commonly known as: CRESTOR  Take 1 tablet (20 mg total) by mouth daily. What changed: when to take this        Allergies:  Allergies  Allergen Reactions   Gabapentin  Hives    Family History  Problem Relation Age of Onset   Cancer Mother    Colon cancer  Mother    Dementia Mother    Alzheimer's disease Mother    Emphysema Father    Hypothyroidism Sister    Thyroid  disease Sister    Hypertension Sister    Multiple sclerosis Daughter    Heart defect Daughter    Colon cancer Maternal Grandmother    Heart disease Maternal Grandfather    Heart disease Paternal Grandmother    Cancer Paternal Grandfather        unknown ( mouth/throat)   Colon cancer Maternal Uncle     Social History:  reports that he has never smoked. He has never used smokeless tobacco. He reports that he does not drink alcohol and does not use drugs.  ROS: A complete review of systems was performed.  All systems are negative except for pertinent findings as noted.  Physical Exam:  Vital signs in last 24 hours: There were no vitals taken for this visit. Constitutional:  Alert and oriented, No acute distress Cardiovascular: Regular rate  Respiratory: Normal respiratory effort GI: There are no inguinal hernias Genitourinary: Penis is circumcised without lesions.  Scrotal skin is normal.  Testicular exam normal bilaterally.  Normal anal sphincter tone.  Several external hemorrhoids noted, nonthrombosed.  Prostate is 50 g, symmetric, no nodules, no tenderness.  There were no rectal masses. Lymphatic: No lymphadenopathy Neurologic: Grossly intact, no focal deficits Psychiatric: Normal mood and affect  I have reviewed prior pt notes  I have reviewed urinalysis results  I have independently reviewed prior imaging--Notes from prior prostate MRI done in [place]  2019  I have reviewed prior PSA results  I have reviewed IPSS form--12/2  Most recent CMP reviewed-creatinine 1.07

## 2024-07-30 ENCOUNTER — Ambulatory Visit: Payer: Medicare Other | Admitting: Urology

## 2024-07-30 VITALS — BP 141/76 | HR 71

## 2024-07-30 DIAGNOSIS — N5201 Erectile dysfunction due to arterial insufficiency: Secondary | ICD-10-CM

## 2024-07-30 DIAGNOSIS — R350 Frequency of micturition: Secondary | ICD-10-CM

## 2024-07-30 DIAGNOSIS — R972 Elevated prostate specific antigen [PSA]: Secondary | ICD-10-CM

## 2024-07-30 DIAGNOSIS — N401 Enlarged prostate with lower urinary tract symptoms: Secondary | ICD-10-CM

## 2024-07-30 DIAGNOSIS — R351 Nocturia: Secondary | ICD-10-CM

## 2024-07-30 LAB — URINALYSIS, ROUTINE W REFLEX MICROSCOPIC
Bilirubin, UA: NEGATIVE
Glucose, UA: NEGATIVE
Leukocytes,UA: NEGATIVE
Nitrite, UA: NEGATIVE
Protein,UA: NEGATIVE
RBC, UA: NEGATIVE
Specific Gravity, UA: 1.02 (ref 1.005–1.030)
Urobilinogen, Ur: 1 mg/dL (ref 0.2–1.0)
pH, UA: 6 (ref 5.0–7.5)

## 2024-07-30 MED ORDER — TADALAFIL 20 MG PO TABS: 20.0000 mg | ORAL_TABLET | ORAL | 11 refills | Status: AC | PRN

## 2024-07-30 MED ORDER — ALFUZOSIN HCL ER 10 MG PO TB24: 10.0000 mg | ORAL_TABLET | Freq: Every evening | ORAL | 3 refills | Status: AC

## 2024-08-16 ENCOUNTER — Ambulatory Visit: Payer: Self-pay | Admitting: Family Medicine

## 2024-08-16 ENCOUNTER — Encounter: Payer: Self-pay | Admitting: Family Medicine

## 2024-08-16 ENCOUNTER — Ambulatory Visit (INDEPENDENT_AMBULATORY_CARE_PROVIDER_SITE_OTHER): Payer: Medicare Other | Admitting: Family Medicine

## 2024-08-16 VITALS — BP 130/71 | HR 69 | Temp 97.4°F | Ht 72.0 in | Wt 189.1 lb

## 2024-08-16 DIAGNOSIS — Z6379 Other stressful life events affecting family and household: Secondary | ICD-10-CM | POA: Diagnosis not present

## 2024-08-16 DIAGNOSIS — R7303 Prediabetes: Secondary | ICD-10-CM | POA: Diagnosis not present

## 2024-08-16 DIAGNOSIS — R350 Frequency of micturition: Secondary | ICD-10-CM | POA: Diagnosis not present

## 2024-08-16 DIAGNOSIS — N402 Nodular prostate without lower urinary tract symptoms: Secondary | ICD-10-CM

## 2024-08-16 DIAGNOSIS — I10 Essential (primary) hypertension: Secondary | ICD-10-CM

## 2024-08-16 DIAGNOSIS — E782 Mixed hyperlipidemia: Secondary | ICD-10-CM | POA: Diagnosis not present

## 2024-08-16 DIAGNOSIS — R21 Rash and other nonspecific skin eruption: Secondary | ICD-10-CM

## 2024-08-16 DIAGNOSIS — Z0000 Encounter for general adult medical examination without abnormal findings: Secondary | ICD-10-CM | POA: Diagnosis not present

## 2024-08-16 DIAGNOSIS — N401 Enlarged prostate with lower urinary tract symptoms: Secondary | ICD-10-CM

## 2024-08-16 LAB — CBC WITH DIFFERENTIAL/PLATELET
Basophils Absolute: 0.1 x10E3/uL (ref 0.0–0.2)
Basos: 1 %
EOS (ABSOLUTE): 0.2 x10E3/uL (ref 0.0–0.4)
Eos: 3 %
Hematocrit: 42.8 % (ref 37.5–51.0)
Hemoglobin: 13.3 g/dL (ref 13.0–17.7)
Immature Grans (Abs): 0 x10E3/uL (ref 0.0–0.1)
Immature Granulocytes: 0 %
Lymphocytes Absolute: 1.8 x10E3/uL (ref 0.7–3.1)
Lymphs: 26 %
MCH: 27.7 pg (ref 26.6–33.0)
MCHC: 31.1 g/dL — ABNORMAL LOW (ref 31.5–35.7)
MCV: 89 fL (ref 79–97)
Monocytes Absolute: 0.6 x10E3/uL (ref 0.1–0.9)
Monocytes: 8 %
Neutrophils Absolute: 4.3 x10E3/uL (ref 1.4–7.0)
Neutrophils: 62 %
Platelets: 281 x10E3/uL (ref 150–450)
RBC: 4.81 x10E6/uL (ref 4.14–5.80)
RDW: 14.6 % (ref 11.6–15.4)
WBC: 7 x10E3/uL (ref 3.4–10.8)

## 2024-08-16 LAB — LIPID PANEL
Chol/HDL Ratio: 4 ratio (ref 0.0–5.0)
Cholesterol, Total: 143 mg/dL (ref 100–199)
HDL: 36 mg/dL — ABNORMAL LOW
LDL Chol Calc (NIH): 85 mg/dL (ref 0–99)
Triglycerides: 124 mg/dL (ref 0–149)
VLDL Cholesterol Cal: 22 mg/dL (ref 5–40)

## 2024-08-16 LAB — CMP14+EGFR
ALT: 11 IU/L (ref 0–44)
AST: 16 IU/L (ref 0–40)
Albumin: 4.3 g/dL (ref 3.8–4.8)
Alkaline Phosphatase: 65 IU/L (ref 47–123)
BUN/Creatinine Ratio: 12 (ref 10–24)
BUN: 12 mg/dL (ref 8–27)
Bilirubin Total: 0.7 mg/dL (ref 0.0–1.2)
CO2: 21 mmol/L (ref 20–29)
Calcium: 9.1 mg/dL (ref 8.6–10.2)
Chloride: 105 mmol/L (ref 96–106)
Creatinine, Ser: 0.97 mg/dL (ref 0.76–1.27)
Globulin, Total: 3 g/dL (ref 1.5–4.5)
Glucose: 116 mg/dL — ABNORMAL HIGH (ref 70–99)
Potassium: 3.7 mmol/L (ref 3.5–5.2)
Sodium: 140 mmol/L (ref 134–144)
Total Protein: 7.3 g/dL (ref 6.0–8.5)
eGFR: 83 mL/min/1.73

## 2024-08-16 LAB — TSH: TSH: 3.93 u[IU]/mL (ref 0.450–4.500)

## 2024-08-16 LAB — PSA: Prostate Specific Ag, Serum: 4 ng/mL (ref 0.0–4.0)

## 2024-08-16 LAB — BAYER DCA HB A1C WAIVED: HB A1C (BAYER DCA - WAIVED): 5.7 % — ABNORMAL HIGH (ref 4.8–5.6)

## 2024-08-16 MED ORDER — CLOTRIMAZOLE-BETAMETHASONE 1-0.05 % EX CREA: 1.0000 | TOPICAL_CREAM | Freq: Two times a day (BID) | CUTANEOUS | 0 refills | Status: DC | PRN

## 2024-08-16 MED ORDER — METOPROLOL TARTRATE 50 MG PO TABS: 50.0000 mg | ORAL_TABLET | Freq: Two times a day (BID) | ORAL | 3 refills | Status: AC

## 2024-08-16 MED ORDER — LISINOPRIL 40 MG PO TABS: 40.0000 mg | ORAL_TABLET | Freq: Every day | ORAL | 3 refills | Status: AC

## 2024-08-16 MED ORDER — ROSUVASTATIN CALCIUM 20 MG PO TABS: 20.0000 mg | ORAL_TABLET | Freq: Every day | ORAL | 3 refills | Status: AC

## 2024-08-16 MED ORDER — ESCITALOPRAM OXALATE 10 MG PO TABS: 10.0000 mg | ORAL_TABLET | Freq: Every day | ORAL | 3 refills | Status: AC

## 2024-08-16 NOTE — Progress Notes (Signed)
 "  Ralph Stevens is a 73 y.o. male presents to office today for annual physical exam examination.    Patient reports that he is due really well.  Left knee has been doing well.  He is going to get all of his teeth pulled on 7 January and get dentures placed with Dr. Jama.  He notes that his mood has been stable and he is been really try to take care of himself more.  He has an 75-month-old dog that has been really motivational and staying physically active and he is walking 2 to 4 miles per day now.  After today's appointment he will be going to see his daughters up in [place]  and is really looking forward to this.  He reports stability of acid reflux.  Did have some increased urination but notes that urinalysis was normal at his urologist office.  Does need to get PSA collected with today's labs though.  Additionally, he reports a patch of skin on the left upper extremity that has been present and irregular for several weeks now.  Does not report any diffuse itching.  Occupation: retired, Marital status: married, Substance use: none Health Maintenance Due  Topic Date Due   Medicare Annual Wellness (AWV)  09/25/2024    Immunization History  Administered Date(s) Administered   Moderna Sars-Covid-2 Vaccination 10/30/2019, 11/27/2019, 08/18/2020   Zoster Recombinant(Shingrix ) 09/06/2021, 12/22/2021   Past Medical History:  Diagnosis Date   Acid reflux    Arthritis    Arthritis    Cataract    Mixed form OU   Enlarged prostate    PSA goes up and down    Family history of adverse reaction to anesthesia    sister has had trouble waking up   GERD (gastroesophageal reflux disease)    High cholesterol    Hyperlipidemia    Hypertension    Hypertensive retinopathy    OU   Pneumonia    Sepsis (HCC)    Social History   Socioeconomic History   Marital status: Married    Spouse name: Dickey   Number of children: 3   Years of education: Not on file   Highest education level: 12th  grade  Occupational History    Comment: semi-retired   Tobacco Use   Smoking status: Never   Smokeless tobacco: Never  Vaping Use   Vaping status: Never Used  Substance and Sexual Activity   Alcohol use: Never   Drug use: Never   Sexual activity: Not Currently  Other Topics Concern   Not on file  Social History Narrative   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     He recently relocated from [place]  and resides locally with his wife.     He has a son that lives in [place] but his other 2 daughters are residing in [place] .   Social Drivers of Health   Tobacco Use: Low Risk (08/16/2024)   Patient History    Smoking Tobacco Use: Never    Smokeless Tobacco Use: Never    Passive Exposure: Not on file  Financial Resource Strain: Low Risk (08/09/2024)   Overall Financial Resource Strain (CARDIA)    Difficulty of Paying Living Expenses: Not very hard  Food Insecurity: No Food Insecurity (08/09/2024)   Epic    Worried About Radiation Protection Practitioner of Food in the Last Year: Never true    Ran Out of Food in the Last Year: Never true  Transportation Needs: No Transportation Needs (08/09/2024)   Epic    Lack of Transportation (Medical): No    Lack of Transportation (Non-Medical): No  Physical Activity: Sufficiently Active (08/09/2024)   Exercise Vital Sign    Days of Exercise per Week: 7 days    Minutes of Exercise per Session: 100 min  Stress: Stress Concern Present (08/09/2024)   [employer] of Occupational Health - Occupational Stress Questionnaire    Feeling of Stress: To some extent  Social Connections: Moderately Isolated (08/09/2024)   Social Connection and Isolation Panel    Frequency of Communication with Friends and Family: More than three times a week    Frequency of Social Gatherings with Friends and Family: Once a week    Attends Religious Services: Never    Database Administrator or Organizations: No    Attends Museum/gallery Exhibitions Officer: Not on file    Marital Status: Married  Catering Manager Violence: Not At Risk (09/26/2023)   Humiliation, Afraid, Rape, and Kick questionnaire    Fear of Current or Ex-Partner: No    Emotionally Abused: No    Physically Abused: No    Sexually Abused: No  Depression (PHQ2-9): Low Risk (02/12/2024)   Depression (PHQ2-9)    PHQ-2 Score: 0  Alcohol Screen: Low Risk (09/26/2023)   Alcohol Screen    Last Alcohol Screening Score (AUDIT): 0  Housing: Low Risk (08/09/2024)   Epic    Unable to Pay for Housing in the Last Year: No    Number of Times Moved in the Last Year: 0    Homeless in the Last Year: No  Utilities: Not At Risk (09/26/2023)   [facility] Utilities    Threatened with loss of utilities: No  Health Literacy: Adequate Health Literacy (09/26/2023)   B1300 Health Literacy    Frequency of need for help with medical instructions: Never   Past Surgical History:  Procedure Laterality Date   colonoscopy  2019   internal hemorrhoids and pancolonic diverticulosis. Surveillance due in 2024 due to history of adenomatous colon polyps and family history.   COLONOSCOPY N/A 06/23/2021   Hemorrhoids on perianal exam. 2 small 4 hemorrhoid tags, minimal internla hemorrhoids, scars appropriately located. Scattered medium-mouthed diverticula in entire colon.   EYE SURGERY     HEMORRHOID SURGERY N/A 12/06/2023   Procedure: 3 COLUMN HEMORRHOIDECTOMY;  Surgeon: Debby Hila, MD;  Location: [facility];  Service: General;  Laterality: N/A;   TONSILLECTOMY     age 78    TOTAL KNEE ARTHROPLASTY Left 01/16/2024   Procedure: ARTHROPLASTY, KNEE, TOTAL;  Surgeon: Ernie Cough, MD;  Location: [facility];  Service: Orthopedics;  Laterality: Left;   Family History  Problem Relation Age of Onset   Cancer Mother    Colon cancer Mother    Dementia Mother    Alzheimer's disease Mother    Emphysema Father    Hypothyroidism Sister    Thyroid  disease Sister    Hypertension Sister    Multiple sclerosis  Daughter    Heart defect Daughter    Colon cancer Maternal Grandmother    Heart disease Maternal Grandfather    Heart disease Paternal Grandmother    Cancer Paternal Grandfather        unknown ( mouth/throat)   Colon cancer Maternal Uncle    Current Medications[1]  Allergies[2]   ROS: Review of Systems Pertinent items noted in HPI and remainder of comprehensive ROS otherwise negative.    Physical exam BP 130/71   Pulse  69   Temp (!) 97.4 F (36.3 C)   Ht 6' (1.829 m)   Wt 189 lb 2 oz (85.8 kg)   SpO2 96%   BMI 25.65 kg/m  General appearance: alert, cooperative, appears stated age, and no distress Head: Normocephalic, without obvious abnormality, atraumatic Eyes: negative findings: lids and lashes normal, conjunctivae and sclerae normal, corneas clear, and pupils equal, round, reactive to light and accomodation Ears: Wears hearing aids.  TMs intact bilaterally with normal light reflex.  Scant dried cerumen in the external auditory canal bilaterally Nose: Slight deviation of the septum.  Moist nasal turbinates Throat: Poor dentition.  Oropharynx without masses.  Moist mucous membranes Neck: no adenopathy, no carotid bruit, supple, symmetrical, trachea midline, and thyroid  not enlarged, symmetric, no tenderness/mass/nodules Back: symmetric, no curvature. ROM normal. No CVA tenderness. Lungs: clear to auscultation bilaterally Chest wall: no tenderness Heart: regular rate and rhythm, S1, S2 normal, no murmur, click, rub or gallop Abdomen: soft, non-tender; bowel sounds normal; no masses,  no organomegaly Extremities: extremities normal, atraumatic, no cyanosis or edema Pulses: 2+ and symmetric Skin: Small area of flesh-colored dry patch along the left upper arm. Lymph nodes: No supraclavicular anterior cervical lymph node enlargement Neurologic: Alert and oriented X 3, normal strength and tone. Normal symmetric reflexes. Normal coordination and gait      02/12/2024    8:28 AM  11/21/2023    2:12 PM 09/26/2023    2:24 PM  Depression screen PHQ 2/9  Decreased Interest 0 0 0  Down, Depressed, Hopeless 0 0 0  PHQ - 2 Score 0 0 0  Altered sleeping 0 0 0  Tired, decreased energy 0 0 0  Change in appetite 0 0 0  Feeling bad or failure about yourself  0 0 0  Trouble concentrating 0 0 0  Moving slowly or fidgety/restless 0 0 0  Suicidal thoughts 0 0 0  PHQ-9 Score 0  0  0   Difficult doing work/chores Not difficult at all Not difficult at all Not difficult at all     Data saved with a previous flowsheet row definition      02/12/2024    8:28 AM 11/21/2023    2:12 PM 08/14/2023    8:48 AM 08/12/2022    9:11 AM  GAD 7 : Generalized Anxiety Score  Nervous, Anxious, on Edge 0 0 0 1  Control/stop worrying 0 0 0 1  Worry too much - different things 0 0 0 1  Trouble relaxing 0 0 0 1  Restless 0 0 0 0  Easily annoyed or irritable 0 0 0 1  Afraid - awful might happen 0 0 0 0  Total GAD 7 Score 0 0 0 5  Anxiety Difficulty Not difficult at all Not difficult at all Not difficult at all Not difficult at all    Recent Results (from the past 2160 hours)  Urinalysis, Routine w reflex microscopic     Status: Abnormal   Collection Time: 07/30/24 10:04 AM  Result Value Ref Range   Specific Gravity, UA 1.020 1.005 - 1.030   pH, UA 6.0 5.0 - 7.5   Color, UA Yellow Yellow   Appearance Ur Clear Clear   Leukocytes,UA Negative Negative   Protein,UA Negative Negative/Trace   Glucose, UA Negative Negative   Ketones, UA Trace (A) Negative   RBC, UA Negative Negative   Bilirubin, UA Negative Negative   Urobilinogen, Ur 1.0 0.2 - 1.0 mg/dL   Nitrite, UA Negative Negative  Microscopic Examination Comment     Comment: Microscopic not indicated and not performed.     Assessment/ Plan: Ralph Stevens here for annual physical exam.   Annual physical exam  Rash - Plan: clotrimazole -betamethasone  (LOTRISONE ) cream  Essential hypertension - Plan: CMP14+EGFR, lisinopril   (ZESTRIL ) 40 MG tablet, metoprolol  tartrate (LOPRESSOR ) 50 MG tablet  Prediabetes - Plan: CMP14+EGFR, Bayer DCA Hb A1c Waived  Mixed hyperlipidemia - Plan: CMP14+EGFR, Lipid Panel, TSH, rosuvastatin  (CRESTOR ) 20 MG tablet  Nodular prostate - Plan: CMP14+EGFR, CBC with Differential, PSA  Benign prostatic hyperplasia with urinary frequency - Plan: CMP14+EGFR, PSA  Stress due to illness of family member - Plan: escitalopram  (LEXAPRO ) 10 MG tablet   Declines vaccinations today.  Fasting labs collected.  Medications renewed.  Will CC PSA result to urologist.  Clotrimazole  with betamethasone  given for the rash.  Follow-up with dermatology if no resolution.  Counseled on healthy lifestyle choices, including diet (rich in fruits, vegetables and lean meats and low in salt and simple carbohydrates) and exercise (at least 30 minutes of moderate physical activity daily).  Patient to follow up 6-12 m  Kelyn Ponciano M. Sadee Osland, DO        [1]  Current Outpatient Medications:    acetaminophen  (TYLENOL ) 500 MG tablet, Take 500 mg by mouth daily as needed for moderate pain (pain score 4-6)., Disp: , Rfl:    alfuzosin  (UROXATRAL ) 10 MG 24 hr tablet, Take 1 tablet (10 mg total) by mouth every evening., Disp: 90 tablet, Rfl: 3   amLODipine  (NORVASC ) 5 MG tablet, Take 1 tablet (5 mg total) by mouth daily., Disp: 90 tablet, Rfl: 3   esomeprazole (NEXIUM) 20 MG capsule, Take 20 mg by mouth daily., Disp: , Rfl:    fluticasone  (FLONASE ) 50 MCG/ACT nasal spray, Place 1-2 sprays into both nostrils daily as needed for allergies or rhinitis., Disp: 48 mL, Rfl: 3   methocarbamol  (ROBAXIN ) 500 MG tablet, Take 1 tablet (500 mg total) by mouth every 6 (six) hours as needed for muscle spasms., Disp: 40 tablet, Rfl: 1   tadalafil  (CIALIS ) 20 MG tablet, Take 1 tablet (20 mg total) by mouth as needed for erectile dysfunction., Disp: 15 tablet, Rfl: 11   escitalopram  (LEXAPRO ) 10 MG tablet, Take 1 tablet (10 mg total) by  mouth daily., Disp: 100 tablet, Rfl: 3   lisinopril  (ZESTRIL ) 40 MG tablet, Take 1 tablet (40 mg total) by mouth daily., Disp: 100 tablet, Rfl: 3   metoprolol  tartrate (LOPRESSOR ) 50 MG tablet, Take 1 tablet (50 mg total) by mouth 2 (two) times daily., Disp: 200 tablet, Rfl: 3   rosuvastatin  (CRESTOR ) 20 MG tablet, Take 1 tablet (20 mg total) by mouth at bedtime., Disp: 100 tablet, Rfl: 3 [2]  Allergies Allergen Reactions   Gabapentin  Hives   "

## 2024-09-04 ENCOUNTER — Other Ambulatory Visit: Payer: Self-pay | Admitting: Family Medicine

## 2024-09-04 DIAGNOSIS — I10 Essential (primary) hypertension: Secondary | ICD-10-CM

## 2024-09-24 ENCOUNTER — Other Ambulatory Visit (INDEPENDENT_AMBULATORY_CARE_PROVIDER_SITE_OTHER): Payer: Self-pay

## 2024-09-26 ENCOUNTER — Ambulatory Visit (INDEPENDENT_AMBULATORY_CARE_PROVIDER_SITE_OTHER): Payer: Medicare Other

## 2024-09-26 VITALS — BP 130/70 | HR 66 | Temp 98.3°F | Ht 73.0 in | Wt 187.0 lb

## 2024-09-26 DIAGNOSIS — Z0000 Encounter for general adult medical examination without abnormal findings: Secondary | ICD-10-CM | POA: Diagnosis not present

## 2024-09-26 NOTE — Progress Notes (Addendum)
 "  Chief Complaint  Patient presents with   Medicare Wellness     Subjective:   Ralph Stevens is a 74 y.o. male who presents for a Medicare Annual Wellness Visit.  Visit info / Clinical Intake: Medicare Wellness Visit Type:: Subsequent Annual Wellness Visit Persons participating in visit and providing information:: patient Medicare Wellness Visit Mode:: In-person (required for WTM) Interpreter Needed?: No Pre-visit prep was completed: yes AWV questionnaire completed by patient prior to visit?: yes Date:: 09/24/24 (per pt) Living arrangements:: (Patient-Rptd) lives with spouse/significant other Patient's Overall Health Status Rating: (Patient-Rptd) good Typical amount of pain: (Patient-Rptd) some Does pain affect daily life?: (Patient-Rptd) no Are you currently prescribed opioids?: no  Dietary Habits and Nutritional Risks How many meals a day?: (Patient-Rptd) 2 Eats fruit and vegetables daily?: (Patient-Rptd) yes Most meals are obtained by: (Patient-Rptd) preparing own meals In the last 2 weeks, have you had any of the following?: none Diabetic:: no  Functional Status Activities of Daily Living (to include ambulation/medication): (Patient-Rptd) Independent Ambulation: (Patient-Rptd) Independent Medication Administration: (Patient-Rptd) Independent Home Management (perform basic housework or laundry): (Patient-Rptd) Independent Manage your own finances?: (Patient-Rptd) yes Primary transportation is: (Patient-Rptd) driving Concerns about vision?: no *vision screening is required for WTM* (2025) Concerns about hearing?: (!) (Patient-Rptd) yes  Fall Screening Falls in the past year?: (Patient-Rptd) 0 Number of falls in past year: 0 Was there an injury with Fall?: 0 Fall Risk Category Calculator: 0 Patient Fall Risk Level: Low Fall Risk  Fall Risk Patient at Risk for Falls Due to: No Fall Risks Fall risk Follow up: Falls evaluation completed; Education  provided  Home and Transportation Safety: All rugs have non-skid backing?: (Patient-Rptd) yes All stairs or steps have railings?: (Patient-Rptd) yes Grab bars in the bathtub or shower?: (Patient-Rptd) yes Have non-skid surface in bathtub or shower?: (Patient-Rptd) yes Good home lighting?: (Patient-Rptd) yes Regular seat belt use?: (Patient-Rptd) yes Hospital stays in the last year:: (!) (Patient-Rptd) yes How many hospital stays:: (Patient-Rptd) 2  Cognitive Assessment Difficulty concentrating, remembering, or making decisions? : (Patient-Rptd) no Will 6CIT or Mini Cog be Completed: yes What year is it?: 0 points What month is it?: 0 points Give patient an address phrase to remember (5 components): [address] [place] Count backwards from 20 to 1: 0 points Say the months of the year in reverse: 0 points Repeat the address phrase from earlier: 0 points  Advance Directives (For Healthcare) Does Patient Have a Medical Advance Directive?: No Would patient like information on creating a medical advance directive?: No - Patient declined  Reviewed/Updated  Reviewed/Updated: Reviewed All (Medical, Surgical, Family, Medications, Allergies, Care Teams, Patient Goals)    Allergies (verified) Gabapentin    Current Medications (verified) Outpatient Encounter Medications as of 09/26/2024  Medication Sig   acetaminophen  (TYLENOL ) 500 MG tablet Take 500 mg by mouth daily as needed for moderate pain (pain score 4-6).   alfuzosin  (UROXATRAL ) 10 MG 24 hr tablet Take 1 tablet (10 mg total) by mouth every evening.   amLODipine  (NORVASC ) 5 MG tablet TAKE 1 TABLET (5 MG TOTAL) BY MOUTH DAILY.   escitalopram  (LEXAPRO ) 10 MG tablet Take 1 tablet (10 mg total) by mouth daily.   esomeprazole (NEXIUM) 20 MG capsule Take 20 mg by mouth daily.   fluticasone  (FLONASE ) 50 MCG/ACT nasal spray Place 1-2 sprays into both nostrils daily as needed for allergies or rhinitis.   lisinopril  (ZESTRIL ) 40  MG tablet Take 1 tablet (40 mg total) by mouth daily.  metoprolol  tartrate (LOPRESSOR ) 50 MG tablet Take 1 tablet (50 mg total) by mouth 2 (two) times daily.   rosuvastatin  (CRESTOR ) 20 MG tablet Take 1 tablet (20 mg total) by mouth at bedtime.   tadalafil  (CIALIS ) 20 MG tablet Take 1 tablet (20 mg total) by mouth as needed for erectile dysfunction.   methocarbamol  (ROBAXIN ) 500 MG tablet Take 1 tablet (500 mg total) by mouth every 6 (six) hours as needed for muscle spasms.   [DISCONTINUED] clotrimazole -betamethasone  (LOTRISONE ) cream Apply 1 Application topically 2 (two) times daily as needed (rash on arm). X7-10 days   No facility-administered encounter medications on file as of 09/26/2024.    History: Past Medical History:  Diagnosis Date   Acid reflux    Arthritis    Arthritis    Cataract    Mixed form OU   Enlarged prostate    PSA goes up and down    Family history of adverse reaction to anesthesia    sister has had trouble waking up   GERD (gastroesophageal reflux disease)    High cholesterol    Hyperlipidemia    Hypertension    Hypertensive retinopathy    OU   Pneumonia    Sepsis [facility])    Past Surgical History:  Procedure Laterality Date   colonoscopy  2019   internal hemorrhoids and pancolonic diverticulosis. Surveillance due in 2024 due to history of adenomatous colon polyps and family history.   COLONOSCOPY N/A 06/23/2021   Hemorrhoids on perianal exam. 2 small 4 hemorrhoid tags, minimal internla hemorrhoids, scars appropriately located. Scattered medium-mouthed diverticula in entire colon.   EYE SURGERY     HEMORRHOID SURGERY N/A 12/06/2023   Procedure: 3 COLUMN HEMORRHOIDECTOMY;  Surgeon: Debby Hila, MD;  Location: [facility];  Service: General;  Laterality: N/A;   JOINT REPLACEMENT     TONSILLECTOMY     age 38    TOTAL KNEE ARTHROPLASTY Left 01/16/2024   Procedure: ARTHROPLASTY, KNEE, TOTAL;  Surgeon: Ernie Cough, MD;  Location: [facility];  Service: Orthopedics;   Laterality: Left;   Family History  Problem Relation Age of Onset   Cancer Mother    Colon cancer Mother    Dementia Mother    Alzheimer's disease Mother    Emphysema Father    Hypothyroidism Sister    Thyroid  disease Sister    Hypertension Sister    Cancer Sister    Multiple sclerosis Daughter    Heart defect Daughter    Colon cancer Maternal Grandmother    Cancer Maternal Grandmother    Heart disease Maternal Grandfather    Heart disease Paternal Grandmother    Hearing loss Paternal Grandmother    Cancer Paternal Grandfather        unknown ( mouth/throat)   Colon cancer Maternal Uncle    Social History   Occupational History    Comment: semi-retired   Tobacco Use   Smoking status: Never   Smokeless tobacco: Never  Vaping Use   Vaping status: Never Used  Substance and Sexual Activity   Alcohol use: Never   Drug use: Never   Sexual activity: Not Currently    Birth control/protection: None   Tobacco Counseling Counseling given: Not Answered  SDOH Screenings   Food Insecurity: No Food Insecurity (09/26/2024)  Housing: Low Risk (09/26/2024)  Transportation Needs: No Transportation Needs (09/26/2024)  Utilities: Not At Risk (09/26/2024)  Alcohol Screen: Low Risk (09/26/2023)  Depression (PHQ2-9): Low Risk (09/26/2024)  Financial Resource Strain: Low Risk (08/09/2024)  Physical Activity:  Sufficiently Active (09/26/2024)  Social Connections: Moderately Isolated (09/26/2024)  Stress: Stress Concern Present (09/26/2024)  Tobacco Use: Low Risk (08/16/2024)  Health Literacy: Adequate Health Literacy (09/26/2024)   See flowsheets for full screening details  Depression Screen PHQ 2 & 9 Depression Scale- Over the past 2 weeks, how often have you been bothered by any of the following problems? Little interest or pleasure in doing things: 0 Feeling down, depressed, or hopeless (PHQ Adolescent also includes...irritable): 0 PHQ-2 Total Score: 0 Trouble falling or staying asleep, or  sleeping too much: 0 Feeling tired or having little energy: 0 Poor appetite or overeating (PHQ Adolescent also includes...weight loss): 0 Feeling bad about yourself - or that you are a failure or have let yourself or your family down: 0 Trouble concentrating on things, such as reading the newspaper or watching television (PHQ Adolescent also includes...like school work): 0 Moving or speaking so slowly that other people could have noticed. Or the opposite - being so fidgety or restless that you have been moving around a lot more than usual: 0 Thoughts that you would be better off dead, or of hurting yourself in some way: 0 PHQ-9 Total Score: 0 If you checked off any problems, how difficult have these problems made it for you to do your work, take care of things at home, or get along with other people?: Not difficult at all     Goals Addressed             This Visit's Progress    Remain active and independent   On track            Objective:    There were no vitals filed for this visit. There is no height or weight on file to calculate BMI.  Hearing/Vision screen No results found. Immunizations and Health Maintenance Health Maintenance  Topic Date Due   COVID-19 Vaccine (4 - 2025-26 season) 04/22/2024   Influenza Vaccine  11/19/2024 (Originally 03/22/2024)   DTaP/Tdap/Td (1 - Tdap) 08/16/2025 (Originally 11/06/1969)   Pneumococcal Vaccine: 50+ Years (1 of 1 - PCV) 08/16/2025 (Originally 11/05/2001)   Medicare Annual Wellness (AWV)  09/26/2025   Colonoscopy  06/24/2031   Hepatitis C Screening  Completed   Zoster Vaccines- Shingrix   Completed   Meningococcal B Vaccine  Aged Out        Assessment/Plan:  This is a routine wellness examination for [facility].  Patient Care Team: Jolinda Norene HERO, DO as PCP - General (Family Medicine) Matilda Senior, MD as Consulting Physician (Urology) Valdemar Rogue, MD as Consulting Physician (Ophthalmology) Shona Rush, MD  (Dermatology) Shaaron Lamar HERO, MD as Consulting Physician (Gastroenterology)  I have personally reviewed and noted the following in the patients chart:   Medical and social history Use of alcohol, tobacco or illicit drugs  Current medications and supplements including opioid prescriptions. Functional ability and status Nutritional status Physical activity Advanced directives List of other physicians Hospitalizations, surgeries, and ER visits in previous 12 months Vitals Screenings to include cognitive, depression, and falls Referrals and appointments  No orders of the defined types were placed in this encounter.  In addition, I have reviewed and discussed with patient certain preventive protocols, quality metrics, and best practice recommendations. A written personalized care plan for preventive services as well as general preventive health recommendations were provided to patient.   Ozie Ned, CMA   09/26/2024   No follow-ups on file.  After Visit Summary: (MyChart) Due to this being an in person visit, the  after visit summary with patients personalized plan was offered to patient via MyChart   Nurse Notes: n/a "

## 2024-09-26 NOTE — Patient Instructions (Signed)
 Mr. Abaya,  Thank you for taking the time for your Medicare Wellness Visit. I appreciate your continued commitment to your health goals. Please review the care plan we discussed, and feel free to reach out if I can assist you further.  Please note that Annual Wellness Visits do not include a physical exam. Some assessments may be limited, especially if the visit was conducted virtually. If needed, we may recommend an in-person follow-up with your provider.  Ongoing Care Seeing your primary care provider every 3 to 6 months helps us  monitor your health and provide consistent, personalized care.   Referrals If a referral was made during today's visit and you haven't received any updates within two weeks, please contact the referred provider directly to check on the status.  Recommended Screenings:  Health Maintenance  Topic Date Due   COVID-19 Vaccine (4 - 2025-26 season) 04/22/2024   Medicare Annual Wellness Visit  09/25/2024   Flu Shot  11/19/2024*   DTaP/Tdap/Td vaccine (1 - Tdap) 08/16/2025*   Pneumococcal Vaccine for age over 26 (1 of 1 - PCV) 08/16/2025*   Colon Cancer Screening  06/24/2031   Hepatitis C Screening  Completed   Zoster (Shingles) Vaccine  Completed   Meningitis B Vaccine  Aged Out  *Topic was postponed. The date shown is not the original due date.       09/24/2024    1:27 PM  Advanced Directives  Does Patient Have a Medical Advance Directive? No  Would patient like information on creating a medical advance directive? No - Patient declined    Vision: Annual vision screenings are recommended for early detection of glaucoma, cataracts, and diabetic retinopathy. These exams can also reveal signs of chronic conditions such as diabetes and high blood pressure.  Dental: Annual dental screenings help detect early signs of oral cancer, gum disease, and other conditions linked to overall health, including heart disease and diabetes.  Please see the attached documents  for additional preventive care recommendations.

## 2024-10-15 ENCOUNTER — Encounter (INDEPENDENT_AMBULATORY_CARE_PROVIDER_SITE_OTHER): Payer: Medicare Other | Admitting: Ophthalmology

## 2025-02-14 ENCOUNTER — Ambulatory Visit: Admitting: Family Medicine

## 2025-07-30 ENCOUNTER — Ambulatory Visit: Admitting: Urology

## 2025-08-19 ENCOUNTER — Encounter: Admitting: Family Medicine
